# Patient Record
Sex: Female | Born: 1970 | Race: White | Hispanic: No | Marital: Married | Smoking: Former smoker
Health system: Southern US, Community
[De-identification: ages and names within clinical notes are randomized; demographics above are authoritative.]

## PROBLEM LIST (undated history)

## (undated) DIAGNOSIS — A483 Toxic shock syndrome: Secondary | ICD-10-CM

## (undated) DIAGNOSIS — E349 Endocrine disorder, unspecified: Secondary | ICD-10-CM

## (undated) DIAGNOSIS — B950 Streptococcus, group A, as the cause of diseases classified elsewhere: Secondary | ICD-10-CM

## (undated) DIAGNOSIS — I3139 Other pericardial effusion (noninflammatory): Secondary | ICD-10-CM

## (undated) DIAGNOSIS — R7989 Other specified abnormal findings of blood chemistry: Secondary | ICD-10-CM

## (undated) DIAGNOSIS — K219 Gastro-esophageal reflux disease without esophagitis: Secondary | ICD-10-CM

## (undated) DIAGNOSIS — I429 Cardiomyopathy, unspecified: Secondary | ICD-10-CM

## (undated) DIAGNOSIS — I313 Pericardial effusion (noninflammatory): Secondary | ICD-10-CM

## (undated) HISTORY — DX: Streptococcus, group A, as the cause of diseases classified elsewhere: B95.0

## (undated) HISTORY — PX: KNEE SURGERY: SHX244

## (undated) HISTORY — DX: Other pericardial effusion (noninflammatory): I31.39

## (undated) HISTORY — DX: Gastro-esophageal reflux disease without esophagitis: K21.9

## (undated) HISTORY — DX: Other specified abnormal findings of blood chemistry: R79.89

## (undated) HISTORY — DX: Cardiomyopathy, unspecified: I42.9

## (undated) HISTORY — DX: Toxic shock syndrome: A48.3

## (undated) HISTORY — PX: TOE FUSION: SHX1070

## (undated) HISTORY — DX: Pericardial effusion (noninflammatory): I31.3

## (undated) HISTORY — DX: Endocrine disorder, unspecified: E34.9

## (undated) HISTORY — PX: CARPAL TUNNEL RELEASE: SHX101

---

## 2006-05-15 ENCOUNTER — Emergency Department (HOSPITAL_COMMUNITY): Admission: EM | Admit: 2006-05-15 | Discharge: 2006-05-15 | Payer: Self-pay | Admitting: Family Medicine

## 2006-06-28 ENCOUNTER — Emergency Department (HOSPITAL_COMMUNITY): Admission: EM | Admit: 2006-06-28 | Discharge: 2006-06-28 | Payer: Self-pay | Admitting: Family Medicine

## 2006-07-04 ENCOUNTER — Emergency Department (HOSPITAL_COMMUNITY): Admission: EM | Admit: 2006-07-04 | Discharge: 2006-07-04 | Payer: Self-pay | Admitting: Emergency Medicine

## 2006-08-24 ENCOUNTER — Ambulatory Visit: Payer: Self-pay | Admitting: Family Medicine

## 2006-08-31 ENCOUNTER — Encounter: Admission: RE | Admit: 2006-08-31 | Discharge: 2006-08-31 | Payer: Self-pay | Admitting: Family Medicine

## 2006-09-27 ENCOUNTER — Ambulatory Visit: Payer: Self-pay | Admitting: Family Medicine

## 2006-10-05 ENCOUNTER — Ambulatory Visit: Payer: Self-pay | Admitting: Family Medicine

## 2006-11-05 ENCOUNTER — Ambulatory Visit: Payer: Self-pay | Admitting: Family Medicine

## 2007-01-14 ENCOUNTER — Inpatient Hospital Stay (HOSPITAL_COMMUNITY): Admission: AD | Admit: 2007-01-14 | Discharge: 2007-01-14 | Payer: Self-pay | Admitting: Obstetrics and Gynecology

## 2007-04-09 ENCOUNTER — Inpatient Hospital Stay (HOSPITAL_COMMUNITY): Admission: AD | Admit: 2007-04-09 | Discharge: 2007-04-12 | Payer: Self-pay | Admitting: Obstetrics and Gynecology

## 2007-05-02 ENCOUNTER — Emergency Department: Payer: Self-pay | Admitting: Emergency Medicine

## 2007-05-03 ENCOUNTER — Other Ambulatory Visit: Payer: Self-pay

## 2007-05-07 ENCOUNTER — Inpatient Hospital Stay: Payer: Self-pay | Admitting: Obstetrics and Gynecology

## 2007-05-29 ENCOUNTER — Ambulatory Visit: Payer: Self-pay | Admitting: Obstetrics and Gynecology

## 2007-06-01 ENCOUNTER — Observation Stay: Payer: Self-pay | Admitting: Certified Nurse Midwife

## 2007-06-12 ENCOUNTER — Inpatient Hospital Stay: Payer: Self-pay | Admitting: Obstetrics and Gynecology

## 2007-07-02 ENCOUNTER — Emergency Department: Payer: Self-pay | Admitting: Unknown Physician Specialty

## 2007-09-27 ENCOUNTER — Ambulatory Visit: Payer: Self-pay | Admitting: Family Medicine

## 2007-11-01 ENCOUNTER — Encounter
Admission: RE | Admit: 2007-11-01 | Discharge: 2007-11-01 | Payer: Self-pay | Admitting: Physical Medicine & Rehabilitation

## 2007-12-10 ENCOUNTER — Ambulatory Visit: Payer: Self-pay | Admitting: Professional

## 2007-12-17 ENCOUNTER — Ambulatory Visit: Payer: Self-pay | Admitting: Professional

## 2008-01-01 ENCOUNTER — Ambulatory Visit: Payer: Self-pay | Admitting: Professional

## 2008-01-10 ENCOUNTER — Emergency Department: Payer: Self-pay | Admitting: Unknown Physician Specialty

## 2008-01-10 ENCOUNTER — Other Ambulatory Visit: Payer: Self-pay

## 2008-02-20 ENCOUNTER — Ambulatory Visit: Payer: Self-pay | Admitting: Family Medicine

## 2008-02-20 DIAGNOSIS — K219 Gastro-esophageal reflux disease without esophagitis: Secondary | ICD-10-CM | POA: Insufficient documentation

## 2008-02-20 DIAGNOSIS — G43109 Migraine with aura, not intractable, without status migrainosus: Secondary | ICD-10-CM | POA: Insufficient documentation

## 2008-02-25 ENCOUNTER — Ambulatory Visit: Payer: Self-pay | Admitting: Family Medicine

## 2008-02-26 ENCOUNTER — Encounter: Payer: Self-pay | Admitting: Family Medicine

## 2008-02-27 LAB — CONVERTED CEMR LAB
AST: 17 units/L (ref 0–37)
Albumin: 4.4 g/dL (ref 3.5–5.2)
Alkaline Phosphatase: 51 units/L (ref 39–117)
Basophils Absolute: 0 10*3/uL (ref 0.0–0.1)
Basophils Relative: 0.1 % (ref 0.0–1.0)
Bilirubin, Direct: 0.1 mg/dL (ref 0.0–0.3)
CO2: 27 meq/L (ref 19–32)
Calcium: 9.6 mg/dL (ref 8.4–10.5)
Eosinophils Absolute: 0.2 10*3/uL (ref 0.0–0.7)
Eosinophils Relative: 2.4 % (ref 0.0–5.0)
GFR calc Af Amer: 81 mL/min
GFR calc non Af Amer: 67 mL/min
Glucose, Bld: 100 mg/dL — ABNORMAL HIGH (ref 70–99)
HDL: 58.8 mg/dL (ref >39.0)
Lymphocytes Relative: 41.2 % (ref 12.0–46.0)
MCV: 89 fL (ref 78.0–100.0)
Monocytes Absolute: 0.6 10*3/uL (ref 0.1–1.0)
Monocytes Relative: 6.8 % (ref 3.0–12.0)
Neutro Abs: 4 10*3/uL (ref 1.4–7.7)
Platelets: 256 10*3/uL (ref 150–400)
RBC: 4.98 M/uL (ref 3.87–5.11)
Total CHOL/HDL Ratio: 4.1
Total Protein: 6.9 g/dL (ref 6.0–8.3)
Triglycerides: 68 mg/dL (ref 0–149)
WBC: 8.2 10*3/uL (ref 4.5–10.5)

## 2008-03-05 ENCOUNTER — Ambulatory Visit: Payer: Self-pay | Admitting: Family Medicine

## 2008-03-05 ENCOUNTER — Telehealth: Payer: Self-pay | Admitting: Physician Assistant

## 2008-03-09 ENCOUNTER — Ambulatory Visit: Payer: Self-pay | Admitting: Gastroenterology

## 2008-04-24 ENCOUNTER — Ambulatory Visit: Payer: Self-pay | Admitting: Internal Medicine

## 2008-04-27 ENCOUNTER — Telehealth (INDEPENDENT_AMBULATORY_CARE_PROVIDER_SITE_OTHER): Payer: Self-pay

## 2008-04-29 ENCOUNTER — Encounter (INDEPENDENT_AMBULATORY_CARE_PROVIDER_SITE_OTHER): Payer: Self-pay | Admitting: *Deleted

## 2008-08-31 ENCOUNTER — Ambulatory Visit: Payer: Self-pay | Admitting: Family Medicine

## 2008-09-01 LAB — CONVERTED CEMR LAB
Basophils Relative: 0.4 % (ref 0.0–3.0)
CO2: 31 meq/L (ref 19–32)
Calcium: 9.1 mg/dL (ref 8.4–10.5)
Chloride: 105 meq/L (ref 96–112)
Creatinine, Ser: 0.8 mg/dL (ref 0.4–1.2)
Eosinophils Relative: 0.9 % (ref 0.0–5.0)
GFR calc Af Amer: 104 mL/min
GFR calc non Af Amer: 86 mL/min
Lymphocytes Relative: 20.3 % (ref 12.0–46.0)
Neutrophils Relative %: 73.9 % (ref 43.0–77.0)
Platelets: 253 10*3/uL (ref 150–400)
RBC: 4.61 M/uL (ref 3.87–5.11)
RDW: 12 % (ref 11.5–14.6)
Sodium: 141 meq/L (ref 135–145)
TSH: 1.03 microintl units/mL (ref 0.35–5.50)
Vitamin B-12: 757 pg/mL (ref 211–911)
WBC: 8.3 10*3/uL (ref 4.5–10.5)

## 2008-10-09 ENCOUNTER — Encounter: Payer: Self-pay | Admitting: Family Medicine

## 2008-10-22 ENCOUNTER — Encounter: Payer: Self-pay | Admitting: Family Medicine

## 2008-11-09 ENCOUNTER — Telehealth: Payer: Self-pay | Admitting: Family Medicine

## 2008-11-10 ENCOUNTER — Ambulatory Visit: Payer: Self-pay | Admitting: Family Medicine

## 2008-11-12 ENCOUNTER — Encounter: Payer: Self-pay | Admitting: Family Medicine

## 2009-02-11 ENCOUNTER — Encounter: Payer: Self-pay | Admitting: Family Medicine

## 2009-10-03 ENCOUNTER — Encounter: Admission: RE | Admit: 2009-10-03 | Discharge: 2009-10-03 | Payer: Self-pay | Admitting: Orthopedic Surgery

## 2009-10-07 ENCOUNTER — Telehealth: Payer: Self-pay | Admitting: Family Medicine

## 2009-10-07 DIAGNOSIS — I059 Rheumatic mitral valve disease, unspecified: Secondary | ICD-10-CM | POA: Insufficient documentation

## 2009-11-18 ENCOUNTER — Ambulatory Visit (HOSPITAL_COMMUNITY)
Admission: RE | Admit: 2009-11-18 | Discharge: 2009-11-19 | Payer: Self-pay | Source: Home / Self Care | Admitting: Orthopedic Surgery

## 2009-11-18 HISTORY — PX: ANTERIOR CERVICAL DECOMP/DISCECTOMY FUSION: SHX1161

## 2010-05-14 LAB — HM PAP SMEAR

## 2010-05-14 LAB — CONVERTED CEMR LAB: Pap Smear: NORMAL

## 2010-08-12 ENCOUNTER — Telehealth: Payer: Self-pay | Admitting: Family Medicine

## 2010-08-31 ENCOUNTER — Telehealth (INDEPENDENT_AMBULATORY_CARE_PROVIDER_SITE_OTHER): Payer: Self-pay | Admitting: *Deleted

## 2010-09-01 ENCOUNTER — Ambulatory Visit
Admission: RE | Admit: 2010-09-01 | Discharge: 2010-09-01 | Payer: Self-pay | Source: Home / Self Care | Attending: Family Medicine | Admitting: Family Medicine

## 2010-09-01 ENCOUNTER — Other Ambulatory Visit: Payer: Self-pay | Admitting: Family Medicine

## 2010-09-01 DIAGNOSIS — R5381 Other malaise: Secondary | ICD-10-CM | POA: Insufficient documentation

## 2010-09-01 DIAGNOSIS — R5383 Other fatigue: Secondary | ICD-10-CM

## 2010-09-01 LAB — BASIC METABOLIC PANEL
BUN: 25 mg/dL — ABNORMAL HIGH (ref 6–23)
CO2: 29 mEq/L (ref 19–32)
Calcium: 8.9 mg/dL (ref 8.4–10.5)
Chloride: 100 mEq/L (ref 96–112)
Creatinine, Ser: 0.8 mg/dL (ref 0.4–1.2)
GFR: 85.05 mL/min (ref >60.00)
Glucose, Bld: 85 mg/dL (ref 70–99)
Potassium: 4.3 mEq/L (ref 3.5–5.1)
Sodium: 136 mEq/L (ref 135–145)

## 2010-09-01 LAB — TSH: TSH: 4.06 u[IU]/mL (ref 0.35–5.50)

## 2010-09-01 LAB — CBC WITH DIFFERENTIAL/PLATELET
Basophils Absolute: 0 10*3/uL (ref 0.0–0.1)
Basophils Relative: 0.5 % (ref 0.0–3.0)
Eosinophils Absolute: 0.1 10*3/uL (ref 0.0–0.7)
Eosinophils Relative: 2.3 % (ref 0.0–5.0)
HCT: 41.6 % (ref 36.0–46.0)
Hemoglobin: 14.3 g/dL (ref 12.0–15.0)
Lymphocytes Relative: 45.8 % (ref 12.0–46.0)
Lymphs Abs: 2.9 10*3/uL (ref 0.7–4.0)
MCHC: 34.4 g/dL (ref 30.0–36.0)
MCV: 91.5 fl (ref 78.0–100.0)
Monocytes Absolute: 0.5 10*3/uL (ref 0.1–1.0)
Monocytes Relative: 7.4 % (ref 3.0–12.0)
Neutro Abs: 2.8 10*3/uL (ref 1.4–7.7)
Neutrophils Relative %: 44 % (ref 43.0–77.0)
Platelets: 250 10*3/uL (ref 150.0–400.0)
RBC: 4.55 Mil/uL (ref 3.87–5.11)
RDW: 12.8 % (ref 11.5–14.6)
WBC: 6.4 10*3/uL (ref 4.5–10.5)

## 2010-09-01 LAB — HEPATIC FUNCTION PANEL
ALT: 18 U/L (ref 0–35)
AST: 23 U/L (ref 0–37)
Albumin: 4.4 g/dL (ref 3.5–5.2)
Alkaline Phosphatase: 54 U/L (ref 39–117)
Bilirubin, Direct: 0.1 mg/dL (ref 0.0–0.3)
Total Bilirubin: 0.8 mg/dL (ref 0.3–1.2)
Total Protein: 6.8 g/dL (ref 6.0–8.3)

## 2010-09-01 LAB — LIPID PANEL
Cholesterol: 191 mg/dL (ref 0–200)
HDL: 64.6 mg/dL (ref >39.00)
LDL Cholesterol: 116 mg/dL — ABNORMAL HIGH (ref 0–99)
Total CHOL/HDL Ratio: 3
Triglycerides: 54 mg/dL (ref 0.0–149.0)
VLDL: 10.8 mg/dL (ref 0.0–40.0)

## 2010-09-04 ENCOUNTER — Encounter: Payer: Self-pay | Admitting: Orthopedic Surgery

## 2010-09-05 ENCOUNTER — Ambulatory Visit
Admission: RE | Admit: 2010-09-05 | Discharge: 2010-09-05 | Payer: Self-pay | Source: Home / Self Care | Attending: Family Medicine | Admitting: Family Medicine

## 2010-09-13 NOTE — Progress Notes (Signed)
Summary: Dental question  Phone Note From Other Clinic   Caller: Dr. Debria Garret [phone] Call For: Dr. Ermalene Searing Summary of Call: would like to know if patient needs pre-medication before dental treatment. Initial call taken by: Mervin Hack CMA Duncan Dull),  October 07, 2009 10:29 AM  Follow-up for Phone Call        I don't have any indication for pretreatment on problem list...please call pt and find out if she has any heart issues we may have not listed...? mitral valve prolapse etc.  Follow-up by: Kerby Nora MD,  October 07, 2009 10:48 AM  Additional Follow-up for Phone Call Additional follow up Details #1::        Left message for patient to call back.Marland Kitchen Spoke with dentist office and patient marked that she had mitral valve prolapse.Consuello Masse CMA  Additional Follow-up by: Benny Lennert CMA Duncan Dull),  October 07, 2009 2:47 PM  New Problems: MITRAL VALVE PROLAPSE (ICD-424.0)   Additional Follow-up for Phone Call Additional follow up Details #2::    Per 2007 AHA/ACC guidelines no longer recommend antibacteria prophylaxsis formitral valve prolapse.  Follow-up by: Kerby Nora MD,  October 08, 2009 8:52 AM  New Problems: MITRAL VALVE PROLAPSE (ICD-424.0)

## 2010-09-15 NOTE — Progress Notes (Signed)
----   Converted from flag ---- ---- 08/30/2010 1:15 PM, Hannah Beat MD wrote: FLP: v77.91 BMP: v77.1 Hepatic Function Panel: v58.69 CBC with diff: 780.79 TSH: 780.79   ---- 08/30/2010 11:04 AM, Liane Comber CMA (AAMA) wrote: Lab orders please! Good Morning! This pt is scheduled for cpx labs [place], which labs to draw and dx codes to use? Thanks Tasha ------------------------------

## 2010-09-15 NOTE — Progress Notes (Signed)
Summary: wants to switch dr's   Phone Note Call from Patient Call back at Home Phone [phone]   Caller: Patient Call For: Kerby Nora MD Summary of Call: Patient's husband is a patient of Dr. Patsy Lager and they want to have the same dr. Patient would like for Dr. Patsy Lager to become her pcp. Is it okay to schedule her an appt to get established with Dr. Patsy Lager. Please advise.  Initial call taken by: Melody Comas,  August 12, 2010 2:55 PM  Follow-up for Phone Call        Yes.. uncomplicated pt.. if okay with [facility]..switch.  Follow-up by: Kerby Nora MD,  August 12, 2010 3:04 PM  Additional Follow-up for Phone Call Additional follow up Details #1::        OK with me, family continuity is helpful. Hannah Beat MD  August 13, 2010 8:38 AM

## 2010-09-15 NOTE — Assessment & Plan Note (Signed)
Summary: CPX/CLE NO PAP   Vital Signs:  Patient profile:   40 year old female Weight:      141 pounds BMI:     22.84 Temp:     98.4 degrees F oral Pulse rate:   80 / minute Pulse rhythm:   regular BP sitting:   108 / 64  (left arm) Cuff size:   regular CC: 30 Minute checkup, no pap   History of Present Illness: 40 year old female:  Dr. Chrissie Noa - Dr. Logan Bores.  3 months - pap, normal  FH: No history of breast cancer.  No history of  colon cancer  Preventive Screening-Counseling & Management  Alcohol-Tobacco     Alcohol drinks/day: 0     Alcohol Counseling: not indicated; patient does not drink     Smoking Status: quit     Tobacco Counseling: not to resume use of tobacco products  Caffeine-Diet-Exercise     Diet Comments: great     Diet Counseling: not indicated; diet is assessed to be healthy     Does Patient Exercise: yes     Exercise Counseling: not indicated; exercise is adequate  Hep-HIV-STD-Contraception     HIV Risk: no risk noted     STD Risk: no risk noted     Contraception Counseling: questions answered     SBE Education/Counseling: to perform regular SBE      Sexual History:  currently monogamous.        Drug Use:  never.    Clinical Review Panels:  Prevention   Last Pap Smear:  normal (05/14/2010)  Immunizations   Last Tetanus Booster:  Historical (08/15/1999)  Lipid Management   Cholesterol:  191 (09/01/2010)   LDL (bad choesterol):  116 (09/01/2010)   HDL (good cholesterol):  64.60 (09/01/2010)  Diabetes Management   Creatinine:  0.8 (09/01/2010)  CBC   WBC:  6.4 (09/01/2010)   RBC:  4.55 (09/01/2010)   Hgb:  14.3 (09/01/2010)   Hct:  41.6 (09/01/2010)   Platelets:  250.0 (09/01/2010)   MCV  91.5 (09/01/2010)   MCHC  34.4 (09/01/2010)   RDW  12.8 (09/01/2010)   PMN:  44.0 (09/01/2010)   Lymphs:  45.8 (09/01/2010)   Monos:  7.4 (09/01/2010)   Eosinophils:  2.3 (09/01/2010)   Basophil:  0.5 (09/01/2010)  Complete Metabolic  Panel   Glucose:  85 (09/01/2010)   Sodium:  136 (09/01/2010)   Potassium:  4.3 (09/01/2010)   Chloride:  100 (09/01/2010)   CO2:  29 (09/01/2010)   BUN:  25 (09/01/2010)   Creatinine:  0.8 (09/01/2010)   Albumin:  4.4 (09/01/2010)   Total Protein:  6.8 (09/01/2010)   Calcium:  8.9 (09/01/2010)   Total Bili:  0.8 (09/01/2010)   Alk Phos:  54 (09/01/2010)   SGPT (ALT):  18 (09/01/2010)   SGOT (AST):  23 (09/01/2010)   Allergies: 1)  ! Codeine  Past History:  Past medical, surgical, family and social histories (including risk factors) reviewed, and no changes noted (except as noted below).  Past Medical History: Reviewed history from 02/20/2008 and no changes required. GERD  Past Surgical History: C-section 2008 complete placenta previa, hemmorhage 7  left knee surgeries 1996-1999  (2 lateral relaeses, 1 tibial tubercle ostomy, 1 patellectomy) diskectomy, 5,6,7: and fusion (Dr. Shon Baton)  Family History: Reviewed history from 02/20/2008 and no changes required. father: died age 81 from sepsis,  Alzheimer's,  HTN mother: HTN, high cholesterol siblings: healthy except sister migraines no cancer MGF: MI age  23 MGM: kidney failure  Social History: Reviewed history from 03/05/2008 and no changes required. Occupation: stay at home mom, previous Emergency planning/management officer Married 4 kids, healthy Past smoker x 16 pack year history Alcohol use-no Drug use-no Regular exercise-yes, 2 times a week Diet: fruits, veggies, water Smoking Status:  quit HIV Risk:  no risk noted STD Risk:  no risk noted Sexual History:  currently monogamous Drug Use:  never  Review of Systems  General: Denies fever, chills, sweats, anorexia, fatigue, weakness, malaise Eyes: Denies blurring, vision loss ENT: Denies earache, nasal congestion, nosebleeds, sore throat, and hoarseness.  Cardiovascular: Denies chest pains, palpitations, syncope, dyspnea on exertion,  Respiratory: Denies cough, dyspnea at  rest, excessive sputum,wheeezing GI: Denies nausea, vomiting, diarrhea, constipation, change in bowel habits, abdominal pain, melena, hematochezia GU: Denies dysuria, hematuria, discharge, urinary frequency, urinary hesitancy, nocturia, incontinence, genital sores, decreased libido Musculoskeletal: Denies back pain, OCC LEFT KNEE PAIN WITH SUBLUXING PATELLAR TENDON Derm: Denies rash, itching Neuro: Denies  paresthesias, frequent falls, frequent headaches, and difficulty walking.  Psych: Denies depression, anxiety Endocrine: Denies cold intolerance, heat intolerance, polydipsia, polyphagia, polyuria, and unusual weight change.  Heme: Denies enlarged lymph nodes Allergy: No hayfever   Otherwise, the pertinent positives and negatives are listed above and in the HPI, otherwise a full review of systems has been reviewed and is negative unless noted positive.   Physical Exam  General:  Well-developed,well-nourished,in no acute distress; alert,appropriate and cooperative throughout examination Head:  Normocephalic and atraumatic without obvious abnormalities. No apparent alopecia or balding. Eyes:  pupils equal, pupils round, pupils reactive to light, pupils react to accomodation, and corneas and lenses clear.   Ears:  External ear exam shows no significant lesions or deformities.  Otoscopic examination reveals clear canals, tympanic membranes are intact bilaterally without bulging, retraction, inflammation or discharge. Hearing is grossly normal bilaterally. Nose:  External nasal examination shows no deformity or inflammation. Nasal mucosa are pink and moist without lesions or exudates. Mouth:  Oral mucosa and oropharynx without lesions or exudates.  Teeth in good repair. Neck:  No deformities, masses, or tenderness noted. Chest Wall:  No deformities, masses, or tenderness noted. Lungs:  Normal respiratory effort, chest expands symmetrically. Lungs are clear to auscultation, no crackles or  wheezes. Heart:  Normal rate and regular rhythm. S1 and S2 normal without gallop, murmur, click, rub or other extra sounds. Abdomen:  Bowel sounds positive,abdomen soft and non-tender without masses, organomegaly or hernias noted. Msk:  normal ROM and no crepitation.   Extremities:  No clubbing, cyanosis, edema, or deformity noted with normal full range of motion of all joints.   Neurologic:  alert & oriented X3 and gait normal.   Skin:  Intact without suspicious lesions or rashes Cervical Nodes:  No lymphadenopathy noted Psych:  Cognition and judgment appear intact. Alert and cooperative with normal attention span and concentration. No apparent delusions, illusions, hallucinations   Impression & Recommendations:  Problem # 1:  HEALTH MAINTENANCE EXAM (ICD-V70.0) The patient's preventative maintenance and recommended screening tests for an annual wellness exam were reviewed in full today. Brought up to date unless services declined.  Counselled on the importance of diet, exercise, and its role in overall health and mortality. The patient's FH and SH was reviewed, including their home life, tobacco status, and drug and alcohol status.   Complete Medication List: 1)  Multivitamins Tabs (Multiple vitamin) .... Take 1 tablet by mouth once a day 2)  Advil 200 Mg Caps (Ibuprofen) .... As needed  3)  Tylenol 325 Mg Tabs (Acetaminophen) .... As needed 4)  Omeprazole 40 Mg Cpdr (Omeprazole) .Marland Kitchen.. 1 by mouth two times a day Prescriptions: OMEPRAZOLE 40 MG CPDR (OMEPRAZOLE) 1 by mouth two times a day  #60 x 5   Entered and Authorized by:   Hannah Beat MD   Signed by:   Hannah Beat MD on 09/05/2010   Method used:   Print then Give to Patient   RxID:   [id number]    Orders Added: 1)  Est. Patient 18-39 years [[id number]]    Current Allergies (reviewed today): ! CODEINE  Last PAP:  Normal (10/04/2006 2:27:09 PM) PAP Result Date:  05/14/2010 PAP Result:  normal

## 2010-11-02 LAB — CBC
Hemoglobin: 14.6 g/dL (ref 12.0–15.0)
MCHC: 34.1 g/dL (ref 30.0–36.0)
MCV: 90.8 fL (ref 78.0–100.0)
RBC: 4.71 MIL/uL (ref 3.87–5.11)

## 2010-11-02 LAB — BASIC METABOLIC PANEL
Calcium: 9.5 mg/dL (ref 8.4–10.5)
Chloride: 103 mEq/L (ref 96–112)
GFR calc Af Amer: >60 mL/min (ref >60)
GFR calc non Af Amer: >60 mL/min (ref >60)
Glucose, Bld: 86 mg/dL (ref 70–99)
Sodium: 136 mEq/L (ref 135–145)

## 2010-11-03 ENCOUNTER — Encounter: Payer: Self-pay | Admitting: Family Medicine

## 2010-11-07 ENCOUNTER — Encounter: Payer: Self-pay | Admitting: Family Medicine

## 2010-11-07 ENCOUNTER — Telehealth: Payer: Self-pay | Admitting: *Deleted

## 2010-11-07 ENCOUNTER — Ambulatory Visit (INDEPENDENT_AMBULATORY_CARE_PROVIDER_SITE_OTHER): Payer: BC Managed Care – PPO | Admitting: Family Medicine

## 2010-11-07 DIAGNOSIS — K5900 Constipation, unspecified: Secondary | ICD-10-CM | POA: Insufficient documentation

## 2010-11-07 DIAGNOSIS — M5412 Radiculopathy, cervical region: Secondary | ICD-10-CM | POA: Insufficient documentation

## 2010-11-07 DIAGNOSIS — B360 Pityriasis versicolor: Secondary | ICD-10-CM

## 2010-11-07 MED ORDER — FLUCONAZOLE 150 MG PO TABS: ORAL_TABLET | ORAL | Status: DC

## 2010-11-07 NOTE — Assessment & Plan Note (Signed)
diflucan 

## 2010-11-07 NOTE — Progress Notes (Signed)
40 year old pleasant female presents:  Neck pain: C5-6, C6-C7 fusion. Anterior fusion now having all numbness and pain in the numbness and arm, pain in the back of her neck . Using topical.  Dr. Shon Baton did fusion.  Significant pain, no weakness.  Rash:  Rash and multiple areas of anterior chest, posterior neck and in the rest of her upper extremities. Is not itching so much, but is perfectly flat. She also has some very small nonvesicular elevated areas  Keratosis:  Tinea Versicolor:     constipation, the patient does have intermittent constipation. She eats a lot of fiber, fruits, vegetables, bran, and drinks plenty of water, but does still have intermittent constipation and goes to the bathroom approximately one time a week.  REVIEW OF SYSTEMS  GEN: No fevers, chills. Nontoxic. Primarily MSK c/o today. MSK: Detailed in the HPI GI: tolerating PO intake without difficulty Neuro: as above Otherwise the pertinent positives of the ROS are noted above.    The PMH, PSH, Social History, Family History, Medications, and allergies have been reviewed in [facility], and have been updated if relevant.   GEN: Well-developed,well-nourished,in no acute distress; alert,appropriate and cooperative throughout examination HEENT: Normocephalic and atraumatic without obvious abnormalities. Ears, externally no deformities PULM: Breathing comfortably in no respiratory distress EXT: No clubbing, cyanosis, or edema PSYCH: Normally interactive. Cooperative during the interview. Pleasant. Friendly and conversant. Not anxious or depressed appearing. Normal, full affect. SKIN:  Macular rash anterior chest, arms, posterior shoulder blade area. Additionally, the patient does have an area on her upper chest with very small nonvesicular slightly raised areas.  CERVICAL SPINE EXAM Range of motion: Flexion, extension, lateral bending, and rotation: mild decrease Pain with terminal motion: mild Spinous Processes: mild TTP,  c4-7 area SCM: NT Upper paracervical muscles: NT Upper traps: NT C5-T1 intact, sensation and motor

## 2010-11-07 NOTE — Telephone Encounter (Signed)
Patient called questioning rx for Diflucan. It was for quantity of 1 with directions to take 3 now , and repeat in 1 week.   Per Dr. Patsy Lager it was to be for quantity of 6. Corrected with pharmacy.

## 2010-11-07 NOTE — Patient Instructions (Signed)
CONSTIPATION Difficult, uncomfortable, infrequent BM  1. Warm prune juice, hot water, tea, coffee, apple juice 2. Prevention: drink 8 glasses water daily, FIBER (raw fruit, veggies, bran cereal, whole grains), regular exercise 3. Bulk formers like Metamucil (psyllium), Citrucel (methylcellulose) usually help 4. Stool softeners (Docusate) occaisionally OK 5. Occaisional over the counter Miralax usually safe 6. Overuse of stimulant  laxatives (Ex-Lax, Mag Citrate, Dulcolax, etc.) can be habit forming  

## 2010-11-07 NOTE — Assessment & Plan Note (Signed)
Refer to the patient instructions sections for details of plan shared with patient.

## 2010-11-07 NOTE — Assessment & Plan Note (Signed)
Deteriorated, 1 year post-op, at this point, needs spine surgeon to reevaluate.

## 2010-12-27 NOTE — H&P (Signed)
Shelly [name], Shelly [name]                ACCOUNT NO.:  [id number]   MEDICAL RECORD NO.:  [id number]          PATIENT TYPE:  MAT   LOCATION:  [facility]                          FACILITY:  [facility]   PHYSICIAN:  Michelle L. Grewal, M.D.DATE OF BIRTH:  03-24-1971   DATE OF ADMISSION:  01/14/2007  DATE OF DISCHARGE:                              HISTORY & PHYSICAL   This is a 40 year old G1 P0 at approximately [redacted] weeks pregnant who  present to the emergency room after complaining of severe persistent  headache.  She has a history of chronic headaches but recently has noted  the headache is increased in intensity and infrequently. She is tried  Fioricet and Tylenol without relief.   EMERGENCY DEPARTMENT COURSE:  The patient is observed in emergency room,  she is noted to be normotensive, heart tones are heard.  She is given  Demerol 25 mg with Phenergan 12.5 mg IM x1. Her headache went from a 10  to 2. Neurologically the patient is intact.  Given that her headache  improved remarkably with the Demerol and Phenergan.  Plan is to  discharge her with Demerol 50 mg p.o. q.6 p.r.n. pain as well Phenergan  12.5 mg p.o. q.6-8 as needed for nausea and pain and she will follow up  in the office in 2 days. If her headache increases in intensity at that  point I recommend full neurologic consultation.  Of note, the patient  has and neurology appointment in early July, but if she is not improved  after 2 days we will seek full neurologic consultation      Michelle L. Vincente Poli, M.D.  Electronically Signed     MLG/MEDQ  D:  01/14/2007  T:  01/14/2007  Job:  [id number]

## 2010-12-30 NOTE — Discharge Summary (Signed)
Shelly [name], Shelly [name]                ACCOUNT NO.:  [id number]   MEDICAL RECORD NO.:  [id number]          PATIENT TYPE:  INP   LOCATION:  9159                          [facility]ACILITY:  [facility]   PHYSICIAN:  Juluis Mire, M.D.   DATE OF BIRTH:  1971-03-12   DATE OF ADMISSION:  04/09/2007  DATE OF DISCHARGE:  04/12/2007                               DISCHARGE SUMMARY   ADMITTING DIAGNOSES:  1. Intrauterine pregnancy at 57 weeks estimated gestational age.  2. Placenta previa, bleeding.   DISCHARGE DIAGNOSES:  1. Intrauterine pregnancy at 31 and three-sevenths weeks estimated      gestational age.  2. Placenta previa, bleeding resolved.   REASON FOR ADMISSION:  Please see written H&P.   HOSPITAL COURSE:  The patient is a 40 year old gravida 4, para 3 that  was admitted to [facility] at 28 weeks estimated  gestational age with vaginal bleeding.  The patient did have history of  a previa.  The patient denied any discomfort or uterine contractions.  On admission, vital signs were stable, fetal heart tones were in the  140s without deceleration.  The patient was now admitted for  observation, steroid administration, ultrasound.  On the following  morning the patient stated she was having minimal bleeding.  Vital signs  remained stable.  Hemoglobin was noted to be 10.8.  Fetal heart tones  were reactive.  The patient continued on bedrest.  She was being  administered betamethasone for enhancement of lung maturity.  Later that  evening, the patient denied any further bleeding.  Glucose tolerance  test was scheduled for the a.m.  On the following morning, vital signs  remained stable, fetal heart tones were in the 140s with nice  acceleration, no contractions were detected.  Minimal old blood was  noted on the pad.  Ultrasound had revealed fetus in the breech  presentation with complete previa.  On the following morning, vital  signs were stable, she remained afebrile.  Abdomen soft,  gravid uterus,  nontender.  No further vaginal bleeding was being observed.  Fetal  movement was experienced.  Fetal heart tones remained reactive.  The  patient had completed betamethasone x2.  AFI was 16 and fetal weight was  1377 g which is in the 72nd percentile.  Glucose tolerance test was 161;  however, thought was not adequate evaluation considering it was  performed immediately following betamethasone.  Recommended the patient  to have followup in 1 week.  On the following morning the patient denied  any further bleeding, no contractions.  She did desire discharge.  Baby  was active.  Discharge instructions were reviewed and the patient was  later discharged home.   CONDITION ON DISCHARGE:  Stable.   DIET:  Regular as tolerated.   FOLLOWUP:  The patient to follow up in the office in 1 week.  She is to  call for menstrual-like cramping, backache, vaginal bleeding, or loss of  fluid.   DISCHARGE MEDICATIONS:  Prenatal vitamins one p.o. daily.      Julio Sicks, N.P.      Juluis Mire,  M.D.  Electronically Signed    CC/MEDQ  D:  05/07/2007  T:  05/07/2007  Job:  [id number]

## 2011-04-03 ENCOUNTER — Other Ambulatory Visit: Payer: Self-pay | Admitting: Family Medicine

## 2011-04-04 ENCOUNTER — Other Ambulatory Visit: Payer: Self-pay | Admitting: *Deleted

## 2011-04-04 NOTE — Telephone Encounter (Signed)
Opened in error

## 2011-05-22 ENCOUNTER — Other Ambulatory Visit: Payer: Self-pay | Admitting: Family Medicine

## 2011-05-22 NOTE — Telephone Encounter (Signed)
Ok, #1

## 2011-05-26 LAB — CBC
Hemoglobin: 10.2 — ABNORMAL LOW
Platelets: 246
RBC: 3.22 — ABNORMAL LOW
RBC: 3.39 — ABNORMAL LOW
RDW: 13.2
WBC: 12.7 — ABNORMAL HIGH
WBC: 18.2 — ABNORMAL HIGH

## 2011-05-26 LAB — GLUCOSE TOLERANCE, 1 HOUR: Glucose, 1 Hour GTT: 161 — ABNORMAL HIGH

## 2012-02-05 ENCOUNTER — Other Ambulatory Visit: Payer: Self-pay | Admitting: Family Medicine

## 2012-02-08 ENCOUNTER — Encounter: Payer: Self-pay | Admitting: Family Medicine

## 2012-02-08 ENCOUNTER — Ambulatory Visit (INDEPENDENT_AMBULATORY_CARE_PROVIDER_SITE_OTHER): Payer: Self-pay | Admitting: Family Medicine

## 2012-02-08 VITALS — BP 120/72 | HR 81 | Temp 98.2°F | Ht 66.0 in

## 2012-02-08 DIAGNOSIS — B360 Pityriasis versicolor: Secondary | ICD-10-CM

## 2012-02-08 MED ORDER — FLUCONAZOLE 150 MG PO TABS: ORAL_TABLET | ORAL | Status: DC

## 2012-02-08 NOTE — Patient Instructions (Signed)
Vicks vapo rub -- put on as much as want for 7-10 days Do for a couple of days, then use 1% hydrocortisone cream for 5-7

## 2012-02-08 NOTE — Progress Notes (Signed)
   Nature conservation officer at [facility] [address] [place] [address] Phone: [phone] Fax: [phone]   Patient Name: Shelly [name] Date of Birth: Mar 07, 1971 Age: 41 y.o. Medical Record Number: [id number] Gender: female Date of Encounter: 02/08/2012  History of Present Illness:  Shelly [name] is a 41 y.o. very pleasant female patient who presents with the following:  Tinea Versicolor: The patient was at the beach last week, and she has noticed that she has had a return of a flat macular rash that she has had last year. This responds well to Diflucan.  Past Medical History, Surgical History, Social History, Family History, Problem List, Medications, and Allergies have been reviewed and updated if relevant.  Prior to Admission medications   Medication Sig Start Date End Date Taking? Authorizing Provider  ibuprofen (ADVIL,MOTRIN) 200 MG tablet Take 200 mg by mouth as needed.     Yes Historical Provider, MD  Multiple Vitamin (MULTIVITAMIN) tablet Take 1 tablet by mouth daily.     Yes Historical Provider, MD    Review of Systems:  GEN: No acute illnesses, no fevers, chills. GI: No n/v/d, eating normally Pulm: No SOB Interactive and getting along well at home.  Otherwise, ROS is as per the HPI.   Physical Examination: Filed Vitals:   02/08/12 1159  BP: 120/72  Pulse: 81  Temp: 98.2 F (36.8 C)   Filed Vitals:   02/08/12 1159  Height: 5\' 6"  (1.676 m)   There is no weight on file to calculate BMI. Ideal Body Weight: Weight in (lb) to have BMI = 25: 154.6    GEN: WDWN, NAD, Non-toxic, Alert & Oriented x 3 HEENT: Atraumatic, Normocephalic.  Ears and Nose: No external deformity. EXTR: No clubbing/cyanosis/edema NEURO: Normal gait.  PSYCH: Normally interactive. Conversant. Not depressed or anxious appearing.  Calm demeanor.  SKIN: Scattered macular rash with mild rare scale arms and shoulders  Assessment and Plan:  1. Tinea versicolor  fluconazole (DIFLUCAN)  150 MG tablet    Orders Today: No orders of the defined types were placed in this encounter.    Medications Today: Meds ordered this encounter  Medications  . fluconazole (DIFLUCAN) 150 MG tablet    Sig: 2 tabs once a week for 4 weeks    Dispense:  8 tablet    Refill:  0     Hannah Beat, MD

## 2012-02-12 ENCOUNTER — Ambulatory Visit: Payer: BC Managed Care – PPO | Admitting: Family Medicine

## 2012-09-19 ENCOUNTER — Other Ambulatory Visit: Payer: Self-pay | Admitting: Family Medicine

## 2013-04-23 ENCOUNTER — Other Ambulatory Visit: Payer: Self-pay | Admitting: Orthopedic Surgery

## 2013-04-24 ENCOUNTER — Encounter (HOSPITAL_BASED_OUTPATIENT_CLINIC_OR_DEPARTMENT_OTHER): Payer: Self-pay | Admitting: *Deleted

## 2013-04-24 NOTE — Progress Notes (Signed)
No labs needed

## 2013-04-30 NOTE — H&P (Signed)
  [facility] [address]   [address] [place], [place] [address] [phone] A Division of [facility]  Loreta Ave, M.D.   Robert A. Thurston Hole, M.D.   Burnell Blanks, M.D.   Eulas Post, M.D.   Lunette Stands, M.D Buford Dresser, M.D.  Charlsie Quest, M.D.   Estell Harpin, M.D.   Melina Fiddler, M.D. Genene Churn. Barry Dienes, PA-C            Kirstin A. 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  Comes in for evaluation of bilateral great toe pain.  Seen and referred by Dr. Cleophas Dunker.  Gradual onset.  Getting much worse over the last few months.  Obvious spurring, loss of motion and pain first MP joint, both feet.  This has progressed to a point that even walking throughout the day causes significant symptoms.  Initially it took more significant activity.  She has significant loss of motion, dorsal spurring.  Pain deep seated in the first MP joint.  Otherwise she is in great health.  Transient improvement with intraarticular injection of first MP joint with Depo-Medrol/Marcaine, not lasting more than a couple of weeks.  Comes in to discuss definitive treatment.  I have reviewed previous workup and treatment to date.  X-rays of both first MP joint reviewed.  On both sides she has moderate narrowing globally in the joint with significant dorsal spurring.  Other joints in the forefoot, midfoot and ankle all look good.  No other significant history of arthritis. Remaining history and general exam is outlined and included in the chart.  EXAMINATION: Specifically, this is a very healthy appearing 42 year-old female.  With walking anything that allows dorsiflexion of any significance first MP joint is painful.  No varus or valgus deformity.  Neurovascularly intact.   Obvious spurring dorsal aspect first MP joint.  Dorsiflexion about 30 degrees on both sides with a relatively abrupt end point.  On the right you can feel bone hit bone.  No sesamoiditis symptoms on either side.  No lesser toe deformities.  No midfoot, hindfoot or ankle issues.    DISPOSITION:  Bilateral hallux rigidus, right greater than left.  Failed conservative treatment including injection.  We have talked about definitive treatment with cheilectomy done sequentially one side and then the other.  I have explained that this is underlying arthritis that will advance in the future, but I think she meets parameters to do a cheilectomy more now rather than go immediately to a fusion.  Procedure, risks, benefits and complications reviewed in detail.  The timing of doing this one side and then the other also outlined.  We could do both at once, but I think she will do better with sequential treatment.  She understands and agrees.  Paperwork complete.  I will see her at the time of operative intervention.    Loreta Ave, M.D.   Electronically verified by Loreta Ave, M.D. DFM:jjh D 02-19-13 T 02-20-13

## 2013-05-01 ENCOUNTER — Encounter (HOSPITAL_BASED_OUTPATIENT_CLINIC_OR_DEPARTMENT_OTHER): Payer: Self-pay | Admitting: Anesthesiology

## 2013-05-01 ENCOUNTER — Ambulatory Visit (HOSPITAL_BASED_OUTPATIENT_CLINIC_OR_DEPARTMENT_OTHER): Payer: BC Managed Care – PPO | Admitting: Anesthesiology

## 2013-05-01 ENCOUNTER — Encounter (HOSPITAL_BASED_OUTPATIENT_CLINIC_OR_DEPARTMENT_OTHER): Payer: Self-pay

## 2013-05-01 ENCOUNTER — Encounter (HOSPITAL_BASED_OUTPATIENT_CLINIC_OR_DEPARTMENT_OTHER)
Admission: RE | Disposition: A | Discharge status: Home / Self Care | Payer: Self-pay | Source: Ambulatory Visit | Attending: Orthopedic Surgery

## 2013-05-01 ENCOUNTER — Ambulatory Visit (HOSPITAL_BASED_OUTPATIENT_CLINIC_OR_DEPARTMENT_OTHER)
Admission: RE | Admit: 2013-05-01 | Discharge: 2013-05-01 | Disposition: A | Discharge status: Home / Self Care | Payer: BC Managed Care – PPO | Source: Ambulatory Visit | Attending: Orthopedic Surgery | Admitting: Orthopedic Surgery

## 2013-05-01 DIAGNOSIS — M2020 Hallux rigidus, unspecified foot: Secondary | ICD-10-CM | POA: Insufficient documentation

## 2013-05-01 DIAGNOSIS — M25572 Pain in left ankle and joints of left foot: Secondary | ICD-10-CM

## 2013-05-01 HISTORY — PX: CHEILECTOMY: SHX1336

## 2013-05-01 SURGERY — CHEILECTOMY
Anesthesia: General | Site: Foot | Laterality: Left | Wound class: Clean

## 2013-05-01 MED ORDER — BUPIVACAINE HCL (PF) 0.5 % IJ SOLN
INTRAMUSCULAR | Status: DC | PRN
  Administered 2013-05-01: 10 mL

## 2013-05-01 MED ORDER — LIDOCAINE HCL (CARDIAC) 20 MG/ML IV SOLN
INTRAVENOUS | Status: DC | PRN
  Administered 2013-05-01: 60 mg via INTRAVENOUS

## 2013-05-01 MED ORDER — OXYCODONE-ACETAMINOPHEN 7.5-325 MG PO TABS: 1.0000 | ORAL_TABLET | ORAL | Status: DC | PRN

## 2013-05-01 MED ORDER — MIDAZOLAM HCL 2 MG/2ML IJ SOLN: 1.0000 mg | INTRAMUSCULAR | Status: DC | PRN

## 2013-05-01 MED ORDER — FENTANYL CITRATE 0.05 MG/ML IJ SOLN: 50.0000 ug | INTRAMUSCULAR | Status: DC | PRN

## 2013-05-01 MED ORDER — CEFAZOLIN SODIUM-DEXTROSE 2-3 GM-% IV SOLR
2.0000 g | INTRAVENOUS | Status: AC
  Administered 2013-05-01: 2 g via INTRAVENOUS

## 2013-05-01 MED ORDER — LACTATED RINGERS IV SOLN
INTRAVENOUS | Status: DC
  Administered 2013-05-01: 08:00:00 via INTRAVENOUS

## 2013-05-01 MED ORDER — PROPOFOL 10 MG/ML IV BOLUS
INTRAVENOUS | Status: DC | PRN
  Administered 2013-05-01: 200 mg via INTRAVENOUS

## 2013-05-01 MED ORDER — DEXTROSE-NACL 5-0.45 % IV SOLN: INTRAVENOUS | Status: DC

## 2013-05-01 MED ORDER — ONDANSETRON HCL 4 MG/2ML IJ SOLN
INTRAMUSCULAR | Status: DC | PRN
  Administered 2013-05-01: 4 mg via INTRAVENOUS

## 2013-05-01 MED ORDER — KETOROLAC TROMETHAMINE 30 MG/ML IJ SOLN
INTRAMUSCULAR | Status: DC | PRN
  Administered 2013-05-01: 30 mg via INTRAVENOUS

## 2013-05-01 MED ORDER — MIDAZOLAM HCL 5 MG/5ML IJ SOLN
INTRAMUSCULAR | Status: DC | PRN
  Administered 2013-05-01: 2 mg via INTRAVENOUS

## 2013-05-01 MED ORDER — FENTANYL CITRATE 0.05 MG/ML IJ SOLN
INTRAMUSCULAR | Status: DC | PRN
  Administered 2013-05-01 (×2): 50 ug via INTRAVENOUS

## 2013-05-01 MED ORDER — PROMETHAZINE HCL 25 MG/ML IJ SOLN: 6.2500 mg | INTRAMUSCULAR | Status: DC | PRN

## 2013-05-01 MED ORDER — HYDROMORPHONE HCL PF 1 MG/ML IJ SOLN
0.2500 mg | INTRAMUSCULAR | Status: DC | PRN
  Administered 2013-05-01: 0.5 mg via INTRAVENOUS

## 2013-05-01 MED ORDER — DEXAMETHASONE SODIUM PHOSPHATE 4 MG/ML IJ SOLN
INTRAMUSCULAR | Status: DC | PRN
  Administered 2013-05-01: 10 mg via INTRAVENOUS

## 2013-05-01 MED ORDER — CHLORHEXIDINE GLUCONATE 4 % EX LIQD: 60.0000 mL | Freq: Once | CUTANEOUS | Status: DC

## 2013-05-01 MED ORDER — FENTANYL CITRATE 0.05 MG/ML IJ SOLN: 50.0000 ug | Freq: Once | INTRAMUSCULAR | Status: DC

## 2013-05-01 SURGICAL SUPPLY — 67 items
APL SKNCLS STERI-STRIP NONHPOA (GAUZE/BANDAGES/DRESSINGS) ×1
BANDAGE ELASTIC 4 VELCRO ST LF (GAUZE/BANDAGES/DRESSINGS) ×1 IMPLANT
BANDAGE ELASTIC 6 VELCRO ST LF (GAUZE/BANDAGES/DRESSINGS) IMPLANT
BANDAGE ESMARK 6X9 LF (GAUZE/BANDAGES/DRESSINGS) ×1 IMPLANT
BENZOIN TINCTURE PRP APPL 2/3 (GAUZE/BANDAGES/DRESSINGS) ×1 IMPLANT
BLADE AVERAGE 25X9 (BLADE) IMPLANT
BLADE OSC/SAG .038X5.5 CUT EDG (BLADE) ×1 IMPLANT
BLADE SURG 15 STRL LF DISP TIS (BLADE) ×1 IMPLANT
BLADE SURG 15 STRL SS (BLADE) ×2
BNDG CMPR 9X6 STRL LF SNTH (GAUZE/BANDAGES/DRESSINGS) ×1
BNDG COHESIVE 4X5 TAN STRL (GAUZE/BANDAGES/DRESSINGS) ×2 IMPLANT
BNDG ESMARK 6X9 LF (GAUZE/BANDAGES/DRESSINGS) ×2
CANISTER SUCTION 1200CC (MISCELLANEOUS) IMPLANT
CLOTH BEACON ORANGE TIMEOUT ST (SAFETY) ×2 IMPLANT
COVER TABLE BACK 60X90 (DRAPES) ×2 IMPLANT
CUFF TOURNIQUET SINGLE 18IN (TOURNIQUET CUFF) IMPLANT
CUFF TOURNIQUET SINGLE 24IN (TOURNIQUET CUFF) ×1 IMPLANT
CUFF TOURNIQUET SINGLE 34IN LL (TOURNIQUET CUFF) IMPLANT
DECANTER SPIKE VIAL GLASS SM (MISCELLANEOUS) IMPLANT
DRAPE EXTREMITY T 121X128X90 (DRAPE) ×2 IMPLANT
DRAPE OEC MINIVIEW 54X84 (DRAPES) ×1 IMPLANT
DRAPE U 20/CS (DRAPES) ×2 IMPLANT
DRAPE U-SHAPE 47X51 STRL (DRAPES) ×2 IMPLANT
DURAPREP 26ML APPLICATOR (WOUND CARE) ×2 IMPLANT
ELECT NDL TIP 2.8 STRL (NEEDLE) ×1 IMPLANT
ELECT NEEDLE TIP 2.8 STRL (NEEDLE) IMPLANT
ELECT REM PT RETURN 9FT ADLT (ELECTROSURGICAL) ×2
ELECTRODE REM PT RTRN 9FT ADLT (ELECTROSURGICAL) ×1 IMPLANT
GAUZE XEROFORM 1X8 LF (GAUZE/BANDAGES/DRESSINGS) ×2 IMPLANT
GLOVE BIO SURGEON STRL SZ8 (GLOVE) ×2 IMPLANT
GLOVE BIOGEL PI IND STRL 7.0 (GLOVE) IMPLANT
GLOVE BIOGEL PI IND STRL 8.5 (GLOVE) ×1 IMPLANT
GLOVE BIOGEL PI INDICATOR 7.0 (GLOVE) ×1
GLOVE BIOGEL PI INDICATOR 8.5 (GLOVE) ×1
GLOVE ECLIPSE 6.5 STRL STRAW (GLOVE) ×1 IMPLANT
GLOVE EXAM NITRILE LRG STRL (GLOVE) ×2 IMPLANT
GLOVE ORTHO TXT STRL SZ7.5 (GLOVE) ×3 IMPLANT
GOWN BRE IMP PREV XXLGXLNG (GOWN DISPOSABLE) ×1 IMPLANT
GOWN PREVENTION PLUS XLARGE (GOWN DISPOSABLE) ×2 IMPLANT
GOWN PREVENTION PLUS XXLARGE (GOWN DISPOSABLE) ×2 IMPLANT
NDL HYPO 25X1 1.5 SAFETY (NEEDLE) IMPLANT
NEEDLE HYPO 25X1 1.5 SAFETY (NEEDLE) ×2 IMPLANT
NS IRRIG 1000ML POUR BTL (IV SOLUTION) ×2 IMPLANT
PACK BASIN DAY SURGERY FS (CUSTOM PROCEDURE TRAY) ×2 IMPLANT
PAD CAST 3X4 CTTN HI CHSV (CAST SUPPLIES) IMPLANT
PAD CAST 4YDX4 CTTN HI CHSV (CAST SUPPLIES) ×2 IMPLANT
PADDING CAST COTTON 3X4 STRL (CAST SUPPLIES)
PADDING CAST COTTON 4X4 STRL (CAST SUPPLIES) ×2
PENCIL BUTTON HOLSTER BLD 10FT (ELECTRODE) ×2 IMPLANT
SPONGE GAUZE 4X4 12PLY (GAUZE/BANDAGES/DRESSINGS) ×2 IMPLANT
SPONGE LAP 4X18 X RAY DECT (DISPOSABLE) IMPLANT
STOCKINETTE 4X48 STRL (DRAPES) ×2 IMPLANT
STRIP CLOSURE SKIN 1/2X4 (GAUZE/BANDAGES/DRESSINGS) ×1 IMPLANT
SUCTION FRAZIER TIP 10 FR DISP (SUCTIONS) IMPLANT
SUT ETHIBOND 2 OS 4 DA (SUTURE) IMPLANT
SUT ETHILON 3 0 PS 1 (SUTURE) ×2 IMPLANT
SUT VIC AB 0 SH 27 (SUTURE) ×2 IMPLANT
SUT VIC AB 2-0 SH 27 (SUTURE)
SUT VIC AB 2-0 SH 27XBRD (SUTURE) IMPLANT
SUT VIC AB 3-0 SH 27 (SUTURE) ×2
SUT VIC AB 3-0 SH 27X BRD (SUTURE) ×1 IMPLANT
SUT VICRYL 4-0 PS2 18IN ABS (SUTURE) IMPLANT
SYR BULB 3OZ (MISCELLANEOUS) ×2 IMPLANT
SYR CONTROL 10ML LL (SYRINGE) ×2 IMPLANT
TUBE CONNECTING 20X1/4 (TUBING) IMPLANT
UNDERPAD 30X30 INCONTINENT (UNDERPADS AND DIAPERS) ×2 IMPLANT
YANKAUER SUCT BULB TIP NO VENT (SUCTIONS) IMPLANT

## 2013-05-01 NOTE — Anesthesia Procedure Notes (Signed)
Procedure Name: LMA Insertion Performed by: Bodee Lafoe W Pre-anesthesia Checklist: Patient identified, Timeout performed, Emergency Drugs available, Suction available and Patient being monitored Patient Re-evaluated:Patient Re-evaluated prior to inductionOxygen Delivery Method: Circle system utilized Preoxygenation: Pre-oxygenation with 100% oxygen Intubation Type: IV induction Ventilation: Mask ventilation without difficulty LMA: LMA inserted LMA Size: 4.0 Number of attempts: 1 Placement Confirmation: breath sounds checked- equal and bilateral and positive ETCO2 Tube secured with: Tape Dental Injury: Teeth and Oropharynx as per pre-operative assessment      

## 2013-05-01 NOTE — Transfer of Care (Signed)
Immediate Anesthesia Transfer of Care Note  Patient: Shelly [name]  Procedure(s) Performed: Procedure(s): LEFT GREAT TOE HALLUX RIGIDUS WITH CHEILECTOMY 1ST METATARSOPHALANGEAL (Left)  Patient Location: PACU  Anesthesia Type:General  Level of Consciousness: awake, alert  and sedated  Airway & Oxygen Therapy: Patient Spontanous Breathing and Patient connected to face mask oxygen  Post-op Assessment: Report given to PACU RN and Post -op Vital signs reviewed and stable  Post vital signs: Reviewed and stable  Complications: No apparent anesthesia complications

## 2013-05-01 NOTE — Anesthesia Postprocedure Evaluation (Signed)
  Anesthesia Post-op Note  Patient: Shelly [name]  Procedure(s) Performed: Procedure(s): LEFT GREAT TOE HALLUX RIGIDUS WITH CHEILECTOMY 1ST METATARSOPHALANGEAL (Left)  Patient Location: PACU  Anesthesia Type:General  Level of Consciousness: awake and alert   Airway and Oxygen Therapy: Patient Spontanous Breathing  Post-op Pain: mild  Post-op Assessment: Post-op Vital signs reviewed, Patient's Cardiovascular Status Stable, Respiratory Function Stable, Patent Airway, No signs of Nausea or vomiting and Pain level controlled  Post-op Vital Signs: stable  Complications: No apparent anesthesia complications

## 2013-05-01 NOTE — Anesthesia Preprocedure Evaluation (Addendum)
Anesthesia Evaluation  Patient identified by MRN, date of birth, ID band Patient awake    Reviewed: Allergy & Precautions, H&P , NPO status , Patient's Chart, lab work & pertinent test results  Airway Mallampati: II TM Distance: >3 FB Neck ROM: Limited    Dental   Pulmonary  breath sounds clear to auscultation        Cardiovascular + Valvular Problems/Murmurs MVP Rhythm:Regular Rate:Normal     Neuro/Psych  Headaches,  Neuromuscular disease    GI/Hepatic GERD-  ,  Endo/Other    Renal/GU      Musculoskeletal   Abdominal   Peds  Hematology   Anesthesia Other Findings   Reproductive/Obstetrics                           Anesthesia Physical Anesthesia Plan  ASA: II  Anesthesia Plan: General   Post-op Pain Management:    Induction: Intravenous  Airway Management Planned: LMA  Additional Equipment:   Intra-op Plan:   Post-operative Plan: Extubation in OR  Informed Consent: I have reviewed the patients History and Physical, chart, labs and discussed the procedure including the risks, benefits and alternatives for the proposed anesthesia with the patient or authorized representative who has indicated his/her understanding and acceptance.     Plan Discussed with: CRNA and Surgeon  Anesthesia Plan Comments:         Anesthesia Quick Evaluation

## 2013-05-01 NOTE — Interval H&P Note (Signed)
History and Physical Interval Note:  05/01/2013 7:34 AM  Shelly [name]  has presented today for surgery, with the diagnosis of LEFT GREAT TOE HALLUX RIGIDUS  The various methods of treatment have been discussed with the patient and family. After consideration of risks, benefits and other options for treatment, the patient has consented to  Procedure(s): LEFT GREAT TOE HALLUX RIGIDUS WITH CHEILECTOMY 1ST METATARSOPHALANGEAL (Left) as a surgical intervention .  The patient's history has been reviewed, patient examined, no change in status, stable for surgery.  I have reviewed the patient's chart and labs.  Questions were answered to the patient's satisfaction.     MURPHY,DANIEL F

## 2013-05-02 ENCOUNTER — Encounter (HOSPITAL_BASED_OUTPATIENT_CLINIC_OR_DEPARTMENT_OTHER): Payer: Self-pay | Admitting: Orthopedic Surgery

## 2013-05-02 NOTE — Op Note (Signed)
NAMEDANDREA, Shelly [name] NO.:  [id number]  MEDICAL RECORD NO.:  [id number]  LOCATION:                               FACILITY:  [facility]  PHYSICIAN:  Loreta Ave, M.D. DATE OF BIRTH:  [birth year]  DATE OF PROCEDURE:  05/01/2013 DATE OF DISCHARGE:  05/01/2013                              OPERATIVE REPORT   PREOPERATIVE DIAGNOSIS:  Hallux rigidus, great toe, left foot.  POSTOPERATIVE DIAGNOSIS:  Hallux rigidus, great toe, left foot.  PROCEDURE:  Left foot first MP exploration, cheilectomy with removal of spurs on the metatarsal head and proximal phalanx establishing good motion.  Generalized debridement of the joint.  SURGEON:  Loreta Ave, M.D.  ASSISTANT:  Danford Bad. Willa Rough, PA-C  ANESTHESIA:  General.  BLOOD LOSS:  Minimal.  SPECIMENS:  None.  CULTURES:  None.  COMPLICATION:  None.  DRESSINGS:  Soft compressive wooden shoe.  TOURNIQUET TIME:  35 minutes.  PROCEDURE IN DETAIL:  The patient was brought to the operating room, placed on the operating table in the supine position.  After adequate anesthesia had been obtained, tourniquet applied.  Prepped and draped in usual sterile fashion.  Exsanguinated with elevation of Esmarch. Tourniquet inflated to 250 mmHg.  Longitudinal incision, dorsal, medial over the first MP joint.  Skin and subcutaneous tissues divided. Adjacent to the extensor tendon, capsulotomy exposed the joint. Fluoroscopic guidance used throughout.  Large dorsal spur top of the metatarsal head and the smaller one at the base of proximal phalanx were all excised contours smoothly.  Grade 4 changes over the top half of the joint grade 3, a little bit more plantar.  Worse on the metatarsal head. Wound completely irrigated.  Confirmed adequate excision and cheilectomy.  Full dorsiflexion and completion without tethering of the joint.  Wound was irrigated again.  The capsule was closed with #1 Vicryl.  Skin closed with nylon.   Margins were injected with Marcaine.  Sterile compressive dressing applied.  Tourniquet inflated and removed.  Anesthesia reversed.  Brought to the recovery room.  Tolerated the surgery well without complications.     Loreta Ave, M.D.   ______________________________ Loreta Ave, M.D.    DFM/MEDQ  D:  05/01/2013  T:  05/02/2013  Job:  [id number]

## 2013-05-05 ENCOUNTER — Encounter (HOSPITAL_BASED_OUTPATIENT_CLINIC_OR_DEPARTMENT_OTHER): Payer: Self-pay | Admitting: Orthopedic Surgery

## 2013-06-02 HISTORY — PX: CHEILECTOMY: SHX1336

## 2013-07-01 ENCOUNTER — Emergency Department (HOSPITAL_COMMUNITY): Payer: BC Managed Care – PPO

## 2013-07-01 ENCOUNTER — Inpatient Hospital Stay (HOSPITAL_COMMUNITY)
Admission: EM | Admit: 2013-07-01 | Discharge: 2013-07-08 | DRG: 853 | Disposition: A | Discharge status: Home / Self Care | Payer: BC Managed Care – PPO | Attending: Internal Medicine | Admitting: Internal Medicine

## 2013-07-01 ENCOUNTER — Encounter (HOSPITAL_COMMUNITY): Payer: Self-pay | Admitting: Emergency Medicine

## 2013-07-01 ENCOUNTER — Inpatient Hospital Stay (HOSPITAL_COMMUNITY): Payer: BC Managed Care – PPO

## 2013-07-01 DIAGNOSIS — D649 Anemia, unspecified: Secondary | ICD-10-CM

## 2013-07-01 DIAGNOSIS — K219 Gastro-esophageal reflux disease without esophagitis: Secondary | ICD-10-CM

## 2013-07-01 DIAGNOSIS — R5381 Other malaise: Secondary | ICD-10-CM

## 2013-07-01 DIAGNOSIS — G43109 Migraine with aura, not intractable, without status migrainosus: Secondary | ICD-10-CM

## 2013-07-01 DIAGNOSIS — I959 Hypotension, unspecified: Secondary | ICD-10-CM | POA: Diagnosis present

## 2013-07-01 DIAGNOSIS — M5412 Radiculopathy, cervical region: Secondary | ICD-10-CM

## 2013-07-01 DIAGNOSIS — A419 Sepsis, unspecified organism: Principal | ICD-10-CM | POA: Diagnosis present

## 2013-07-01 DIAGNOSIS — Z87891 Personal history of nicotine dependence: Secondary | ICD-10-CM

## 2013-07-01 DIAGNOSIS — L02419 Cutaneous abscess of limb, unspecified: Secondary | ICD-10-CM

## 2013-07-01 DIAGNOSIS — L02619 Cutaneous abscess of unspecified foot: Secondary | ICD-10-CM | POA: Diagnosis present

## 2013-07-01 DIAGNOSIS — Z113 Encounter for screening for infections with a predominantly sexual mode of transmission: Secondary | ICD-10-CM

## 2013-07-01 DIAGNOSIS — L089 Local infection of the skin and subcutaneous tissue, unspecified: Secondary | ICD-10-CM | POA: Diagnosis present

## 2013-07-01 DIAGNOSIS — R000 Tachycardia, unspecified: Secondary | ICD-10-CM | POA: Diagnosis present

## 2013-07-01 DIAGNOSIS — I5021 Acute systolic (congestive) heart failure: Secondary | ICD-10-CM

## 2013-07-01 DIAGNOSIS — I059 Rheumatic mitral valve disease, unspecified: Secondary | ICD-10-CM

## 2013-07-01 DIAGNOSIS — I509 Heart failure, unspecified: Secondary | ICD-10-CM | POA: Diagnosis not present

## 2013-07-01 DIAGNOSIS — T8140XA Infection following a procedure, unspecified, initial encounter: Secondary | ICD-10-CM

## 2013-07-01 DIAGNOSIS — E876 Hypokalemia: Secondary | ICD-10-CM | POA: Diagnosis not present

## 2013-07-01 DIAGNOSIS — I5020 Unspecified systolic (congestive) heart failure: Secondary | ICD-10-CM | POA: Diagnosis not present

## 2013-07-01 DIAGNOSIS — T8132XA Disruption of internal operation (surgical) wound, not elsewhere classified, initial encounter: Secondary | ICD-10-CM | POA: Diagnosis present

## 2013-07-01 DIAGNOSIS — J189 Pneumonia, unspecified organism: Secondary | ICD-10-CM

## 2013-07-01 DIAGNOSIS — Z981 Arthrodesis status: Secondary | ICD-10-CM

## 2013-07-01 DIAGNOSIS — I309 Acute pericarditis, unspecified: Secondary | ICD-10-CM

## 2013-07-01 DIAGNOSIS — R6510 Systemic inflammatory response syndrome (SIRS) of non-infectious origin without acute organ dysfunction: Secondary | ICD-10-CM | POA: Diagnosis present

## 2013-07-01 DIAGNOSIS — I319 Disease of pericardium, unspecified: Secondary | ICD-10-CM | POA: Diagnosis not present

## 2013-07-01 DIAGNOSIS — Y838 Other surgical procedures as the cause of abnormal reaction of the patient, or of later complication, without mention of misadventure at the time of the procedure: Secondary | ICD-10-CM | POA: Diagnosis present

## 2013-07-01 DIAGNOSIS — J90 Pleural effusion, not elsewhere classified: Secondary | ICD-10-CM | POA: Diagnosis not present

## 2013-07-01 DIAGNOSIS — A483 Toxic shock syndrome: Secondary | ICD-10-CM | POA: Diagnosis present

## 2013-07-01 DIAGNOSIS — B950 Streptococcus, group A, as the cause of diseases classified elsewhere: Secondary | ICD-10-CM | POA: Diagnosis present

## 2013-07-01 DIAGNOSIS — T81329A Deep disruption or dehiscence of operation wound, unspecified, initial encounter: Secondary | ICD-10-CM | POA: Diagnosis present

## 2013-07-01 DIAGNOSIS — B360 Pityriasis versicolor: Secondary | ICD-10-CM

## 2013-07-01 LAB — POCT I-STAT, CHEM 8
BUN: 18 mg/dL (ref 6–23)
Chloride: 102 mEq/L (ref 96–112)
Potassium: 4.3 mEq/L (ref 3.5–5.1)
Sodium: 139 mEq/L (ref 135–145)
TCO2: 23 mmol/L (ref 0–100)

## 2013-07-01 LAB — HEPATIC FUNCTION PANEL
ALT: 17 U/L (ref 0–35)
Albumin: 2.5 g/dL — ABNORMAL LOW (ref 3.5–5.2)
Alkaline Phosphatase: 26 U/L — ABNORMAL LOW (ref 39–117)
Bilirubin, Direct: 0.1 mg/dL (ref 0.0–0.3)
Indirect Bilirubin: 0.3 mg/dL (ref 0.3–0.9)

## 2013-07-01 LAB — SEDIMENTATION RATE: Sed Rate: 6 mm/hr (ref 0–22)

## 2013-07-01 LAB — GLUCOSE, CAPILLARY: Glucose-Capillary: 113 mg/dL — ABNORMAL HIGH (ref 70–99)

## 2013-07-01 LAB — MAGNESIUM: Magnesium: 1 mg/dL — ABNORMAL LOW (ref 1.5–2.5)

## 2013-07-01 LAB — CBC WITH DIFFERENTIAL/PLATELET
Basophils Absolute: 0 10*3/uL (ref 0.0–0.1)
Basophils Absolute: 0 10*3/uL (ref 0.0–0.1)
Basophils Relative: 0 % (ref 0–1)
Eosinophils Absolute: 0 10*3/uL (ref 0.0–0.7)
Eosinophils Relative: 0 % (ref 0–5)
Hemoglobin: 10.4 g/dL — ABNORMAL LOW (ref 12.0–15.0)
Lymphocytes Relative: 3 % — ABNORMAL LOW (ref 12–46)
MCH: 31 pg (ref 26.0–34.0)
MCHC: 35.5 g/dL (ref 30.0–36.0)
Monocytes Absolute: 0.7 10*3/uL (ref 0.1–1.0)
Neutro Abs: 10.5 10*3/uL — ABNORMAL HIGH (ref 1.7–7.7)
Neutrophils Relative %: 96 % — ABNORMAL HIGH (ref 43–77)
Neutrophils Relative %: 96 % — ABNORMAL HIGH (ref 43–77)
Platelets: 228 10*3/uL (ref 150–400)
Platelets: 191 10*3/uL (ref 150–400)
RBC: 4.12 MIL/uL (ref 3.87–5.11)
RDW: 13.1 % (ref 11.5–15.5)
RDW: 13.4 % (ref 11.5–15.5)
WBC Morphology: INCREASED
WBC: 11 10*3/uL — ABNORMAL HIGH (ref 4.0–10.5)

## 2013-07-01 LAB — CBC
MCH: 29.7 pg (ref 26.0–34.0)
MCHC: 33.7 g/dL (ref 30.0–36.0)
MCV: 88.1 fL (ref 78.0–100.0)
Platelets: 179 10*3/uL (ref 150–400)
RDW: 13.2 % (ref 11.5–15.5)

## 2013-07-01 LAB — C-REACTIVE PROTEIN: CRP: 3.9 mg/dL — ABNORMAL HIGH (ref <0.60)

## 2013-07-01 LAB — CREATININE, SERUM: Creatinine, Ser: 1.03 mg/dL (ref 0.50–1.10)

## 2013-07-01 LAB — PHOSPHORUS: Phosphorus: 3.2 mg/dL (ref 2.3–4.6)

## 2013-07-01 LAB — BASIC METABOLIC PANEL
CO2: 18 mEq/L — ABNORMAL LOW (ref 19–32)
Calcium: 6.8 mg/dL — ABNORMAL LOW (ref 8.4–10.5)
Creatinine, Ser: 1.06 mg/dL (ref 0.50–1.10)
GFR calc Af Amer: 75 mL/min — ABNORMAL LOW (ref >90)
GFR calc non Af Amer: 64 mL/min — ABNORMAL LOW (ref >90)
Glucose, Bld: 129 mg/dL — ABNORMAL HIGH (ref 70–99)
Potassium: 3.9 mEq/L (ref 3.5–5.1)

## 2013-07-01 LAB — PROCALCITONIN: Procalcitonin: 12.21 ng/mL

## 2013-07-01 LAB — MRSA PCR SCREENING: MRSA by PCR: NEGATIVE

## 2013-07-01 LAB — LACTIC ACID, PLASMA: Lactic Acid, Venous: 2.8 mmol/L — ABNORMAL HIGH (ref 0.5–2.2)

## 2013-07-01 MED ORDER — HYDROMORPHONE HCL PF 1 MG/ML IJ SOLN
1.0000 mg | Freq: Once | INTRAMUSCULAR | Status: AC
  Administered 2013-07-01: 1 mg via INTRAVENOUS
  Filled 2013-07-01: qty 1

## 2013-07-01 MED ORDER — PIPERACILLIN-TAZOBACTAM 3.375 G IVPB 30 MIN
3.3750 g | INTRAVENOUS | Status: AC
  Administered 2013-07-01: 3.375 g via INTRAVENOUS
  Filled 2013-07-01: qty 50

## 2013-07-01 MED ORDER — SODIUM CHLORIDE 0.9 % IV SOLN: 250.0000 mL | INTRAVENOUS | Status: DC | PRN

## 2013-07-01 MED ORDER — SODIUM CHLORIDE 0.9 % IV BOLUS (SEPSIS)
1000.0000 mL | Freq: Once | INTRAVENOUS | Status: AC
  Administered 2013-07-01: 1000 mL via INTRAVENOUS

## 2013-07-01 MED ORDER — ONDANSETRON HCL 4 MG/2ML IJ SOLN
4.0000 mg | Freq: Four times a day (QID) | INTRAMUSCULAR | Status: DC | PRN
  Administered 2013-07-01 (×3): 4 mg via INTRAVENOUS
  Filled 2013-07-01 (×3): qty 2

## 2013-07-01 MED ORDER — FENTANYL CITRATE 0.05 MG/ML IJ SOLN
50.0000 ug | INTRAMUSCULAR | Status: DC | PRN
  Administered 2013-07-01 – 2013-07-03 (×9): 50 ug via INTRAVENOUS
  Filled 2013-07-01 (×8): qty 2

## 2013-07-01 MED ORDER — VANCOMYCIN HCL IN DEXTROSE 1-5 GM/200ML-% IV SOLN
1000.0000 mg | Freq: Once | INTRAVENOUS | Status: AC
  Administered 2013-07-01: 1000 mg via INTRAVENOUS
  Filled 2013-07-01: qty 200

## 2013-07-01 MED ORDER — SODIUM CHLORIDE 0.9 % IV BOLUS (SEPSIS)
500.0000 mL | Freq: Once | INTRAVENOUS | Status: AC
  Administered 2013-07-01: 500 mL via INTRAVENOUS

## 2013-07-01 MED ORDER — HEPARIN SODIUM (PORCINE) 5000 UNIT/ML IJ SOLN
5000.0000 [IU] | Freq: Three times a day (TID) | INTRAMUSCULAR | Status: DC
  Administered 2013-07-01 – 2013-07-08 (×20): 5000 [IU] via SUBCUTANEOUS
  Filled 2013-07-01 (×28): qty 1

## 2013-07-01 MED ORDER — PIPERACILLIN-TAZOBACTAM 3.375 G IVPB
3.3750 g | Freq: Three times a day (TID) | INTRAVENOUS | Status: DC
  Administered 2013-07-01 – 2013-07-02 (×2): 3.375 g via INTRAVENOUS
  Filled 2013-07-01 (×4): qty 50

## 2013-07-01 MED ORDER — SODIUM CHLORIDE 0.9 % IV SOLN
INTRAVENOUS | Status: AC
  Administered 2013-07-01: 1000 mL via INTRAVENOUS

## 2013-07-01 MED ORDER — SODIUM CHLORIDE 0.9 % IV SOLN
INTRAVENOUS | Status: DC
  Administered 2013-07-01: 12:00:00 via INTRAVENOUS

## 2013-07-01 MED ORDER — SODIUM CHLORIDE 0.9 % IV SOLN
750.0000 mL | INTRAVENOUS | Status: DC | PRN
  Administered 2013-07-01: 750 mL via INTRAVENOUS
  Administered 2013-07-01: 1000 mL via INTRAVENOUS
  Administered 2013-07-01: 17:00:00 via INTRAVENOUS
  Administered 2013-07-01 – 2013-07-02 (×4): 750 mL via INTRAVENOUS

## 2013-07-01 MED ORDER — INFLUENZA VAC SPLIT QUAD 0.5 ML IM SUSP
0.5000 mL | INTRAMUSCULAR | Status: DC | PRN
  Filled 2013-07-01: qty 0.5

## 2013-07-01 MED ORDER — VANCOMYCIN HCL IN DEXTROSE 750-5 MG/150ML-% IV SOLN
750.0000 mg | Freq: Three times a day (TID) | INTRAVENOUS | Status: DC
  Administered 2013-07-01 – 2013-07-02 (×3): 750 mg via INTRAVENOUS
  Filled 2013-07-01 (×5): qty 150

## 2013-07-01 MED ORDER — PANTOPRAZOLE SODIUM 40 MG IV SOLR
40.0000 mg | Freq: Every day | INTRAVENOUS | Status: DC
  Administered 2013-07-01 – 2013-07-02 (×2): 40 mg via INTRAVENOUS
  Filled 2013-07-01 (×3): qty 40

## 2013-07-01 MED ORDER — MAGNESIUM SULFATE 40 MG/ML IJ SOLN
4.0000 g | Freq: Once | INTRAMUSCULAR | Status: AC
  Administered 2013-07-01: 4 g via INTRAVENOUS
  Filled 2013-07-01: qty 100

## 2013-07-01 MED ORDER — PROMETHAZINE HCL 25 MG/ML IJ SOLN
12.5000 mg | Freq: Four times a day (QID) | INTRAMUSCULAR | Status: DC | PRN
  Filled 2013-07-01: qty 1

## 2013-07-01 MED ORDER — ACETAMINOPHEN 10 MG/ML IV SOLN
1000.0000 mg | Freq: Four times a day (QID) | INTRAVENOUS | Status: AC | PRN
  Administered 2013-07-01 – 2013-07-02 (×4): 1000 mg via INTRAVENOUS
  Filled 2013-07-01 (×4): qty 100

## 2013-07-01 MED ORDER — NOREPINEPHRINE BITARTRATE 1 MG/ML IJ SOLN
2.0000 ug/min | INTRAVENOUS | Status: DC
  Administered 2013-07-01: 5 ug/min via INTRAVENOUS
  Administered 2013-07-01 – 2013-07-02 (×2): 6 ug/min via INTRAVENOUS
  Administered 2013-07-02: 8 ug/min via INTRAVENOUS
  Filled 2013-07-01 (×3): qty 4

## 2013-07-01 MED ORDER — GADOBENATE DIMEGLUMINE 529 MG/ML IV SOLN
15.0000 mL | Freq: Once | INTRAVENOUS | Status: AC
  Administered 2013-07-01: 14 mL via INTRAVENOUS

## 2013-07-01 NOTE — Progress Notes (Signed)
cxr - clear  LABS Low mag    Passive Leg Raise test  - no aline to assess response  - aptient cvp 8 and has goten 7L fluids  SUpinne  HR 133, BP 106 sbp, MPA 70 Legs 45 degree  HR 131, SBP 110, MAP 66  PLAn Quick 500cc bolus over 15 min and repeat above test Mag sulfate 4gm   Dr. Kalman Shan, M.D., [facility].C.P Pulmonary and Critical Care Medicine Staff Physician [facility] [place] [facility]monary and Critical Care Pager: [phone], If no answer or between  15:00h - 7:00h: call [phone]  07/01/2013 7:14 PM

## 2013-07-01 NOTE — Progress Notes (Signed)
UR Completed.  Vangie Bicker [phone] 07/01/2013

## 2013-07-01 NOTE — H&P (Signed)
PULMONARY  / CRITICAL CARE MEDICINE  Name: Shelly [name] MRN: [id number] DOB: 11-02-1970    ADMISSION DATE:  07/01/2013 CONSULTATION DATE:  07/01/2013  REFERRING MD :  Rubin Payor (ED) PRIMARY SERVICE: PCCM  CHIEF COMPLAINT:  Hypotension/Sepsis  BRIEF PATIENT DESCRIPTION: 42 y.o. F who underwent right great toe chilectomy 1 month ago (Dr. Eulah Pont).  Had stitches removed post op week 2 - 3, then wound began to dehisce.  On 11/17, pt began to have fever/chills and generalized weakness/not feeling well.  Came to ER 11/18, PCCM consulted for Sepsis/hypotension.  SIGNIFICANT EVENTS / STUDIES:  10/20 Right great toe chilectomy 11/17 Began to experience fever/chills/weakness. 11/18 Presented to ER, found to be hypotensive and tachycardic.  XRay of R great toe showed no acute fracture or evidence of osteo.  MRI of R great toe showed no evidence of osteo/tenosynovitis/septic joint.  Did show dorsal soft tissue abscess, 6cm diameter.  LINES / TUBES:  CULTURES: Blood 11/18 >>> Urine 11/18 >>> Wound 11/18 >>>  ANTIBIOTICS: Vanc 11/18 >>> Zosyn 11/18 >>>  HISTORY OF PRESENT ILLNESS:  [name] is a 42 y.o. Female who underwent a right great toe chilectomy on 10/20, performed by Dr. Eulah Pont.  She states that surgery went well and 2 weeks post op, she went to Dr. Greig Right office for suture removal.  At that time, they removed most of the sutures but not all.  1 week later, she returned to the office for removal of remaining sutures.  All sutures removed and steri strips were placed.  Yesterday, she returned to the office because the wound began to swell more and also had some drainage.  Wet to dry dressings were placed in the office and pt returned home.  Later in the day, she began to feel worse with fevers, chills, and generalized weakness.  She presented to the ER early this AM (11/18) because she felt much worse.  In the ED, pt noted to be hypotensive with nadir SBP in  80's.  3 L NS boluses were  infused and pt was started on Vancomycin. Ortho PA did see the pt and re-dressed the wound.  I&D of abscess was deferred until Dr. Eulah Pont can come and see the pt later today. PCCM consulted for further management of hypotension and sepsis.  During our exam, pt denies any chest pain, SOB, worsened pain, N/V/D.  PAST MEDICAL HISTORY :  Past Medical History  Diagnosis Date  . GERD (gastroesophageal reflux disease)   . Medical history non-contributory    Past Surgical History  Procedure Laterality Date  . Cesarean section  2008    complete placenta pervia, hemmorage  . Knee surgery  [phone]    7 surgeries left knee(2 lateral releases, 1 tibial tubercle oxtomy, 1 patellectomy)  . Cervical fusion  2011  . Cheilectomy Left 05/01/2013    Procedure: LEFT GREAT TOE HALLUX RIGIDUS WITH CHEILECTOMY 1ST METATARSOPHALANGEAL;  Surgeon: Loreta Ave, MD;  Location: [facility];  Service: Orthopedics;  Laterality: Left;  . Carpal tunnel release Left   . Cheilectomy Right 06/02/2013    procedule:right great toe chielectomy   Prior to Admission medications   Medication Sig Start Date End Date Taking? Authorizing Provider  acetaminophen (TYLENOL) 500 MG tablet Take 1,000 mg by mouth every 6 (six) hours as needed for mild pain.   Yes Historical Provider, MD  ibuprofen (ADVIL,MOTRIN) 200 MG tablet Take 400 mg by mouth every 6 (six) hours as needed for moderate pain.    Yes  Historical Provider, MD  Multiple Vitamin (MULTIVITAMIN) tablet Take 1 tablet by mouth daily.     Yes Historical Provider, MD   Allergies  Allergen Reactions  . Codeine     REACTION: Hives    FAMILY HISTORY:  Family History  Problem Relation Age of Onset  . Hypertension Mother   . Hyperlipidemia Mother   . Hypertension Father   . Alzheimer's disease Father   . Migraines Sister   . Kidney disease Maternal Grandmother   . Heart attack Maternal Grandfather 60   SOCIAL HISTORY:  reports that she quit smoking  about 18 years ago. She does not have any smokeless tobacco history on file. She reports that she does not drink alcohol or use illicit drugs.  REVIEW OF SYSTEMS:  Negative except as stated in HPI.  SUBJECTIVE:  C/o generalized pain, including shooting pain up right leg VITAL SIGNS: Temp:  [97.8 F (36.6 C)-101.6 F (38.7 C)] 101.6 F (38.7 C) (11/18 0939) Pulse Rate:  [115-135] 134 (11/18 1000) Resp:  [16-20] 20 (11/18 0939) BP: (85-119)/(34-60) 91/34 mmHg (11/18 1000) SpO2:  [93 %-100 %] 99 % (11/18 1000) HEMODYNAMICS:   VENTILATOR SETTINGS:   INTAKE / OUTPUT: Intake/Output     11/17 0701 - 11/18 0700 11/18 0701 - 11/19 0700   I.V.  2000   Total Intake   2000   Net   +2000          PHYSICAL EXAMINATION: General:  Pleasant female, lying in bed, ill appearing. Neuro:  A&O x 3.  Mood and affect appropriate. HEENT:  [place]/AT.  PERRL.  MMM. Cardiovascular:  Tachycardic, no M/R/G. Lungs:  Resps even and unlabored.  CTA bilaterally, no W/R/R. Abdomen:  BS x 4.  Soft, NT/ND. Musculoskeletal:  Dressing of right great toe in place, C/D/I.  No edema. Skin:  Very warm to touch and dry.  LABS:  CBC  Recent Labs Lab 07/01/13 0540 07/01/13 0549  WBC 11.0*  --   HGB 12.4 12.6  HCT 36.6 37.0  PLT 228  --    BMET  Recent Labs Lab 07/01/13 0549  NA 139  K 4.3  CL 102  BUN 18  CREATININE 1.00  GLUCOSE 120*   Sepsis Markers  Recent Labs Lab 07/01/13 1020  LATICACIDVEN 1.43   Imaging Mr Foot Right W Wo Contrast  07/01/2013   CLINICAL DATA:  Large nonhealing wound on the dorsal aspect of the foot.  EXAM: MRI OF THE RIGHT FOREFOOT WITHOUT AND WITH CONTRAST  TECHNIQUE: Multiplanar, multisequence MR imaging was performed both before and after administration of intravenous contrast.  CONTRAST:  14mL MULTIHANCE GADOBENATE DIMEGLUMINE 529 MG/ML IV SOLN  COMPARISON:  Radiographs dated 07/01/2013  FINDINGS: Soft tissue wound is seen in the subcutaneous fat of the dorsum of  the foot overlying the head of the 1st metatarsal. There is no significant joint effusion. Minimal edema in the head of the 1st metatarsal is consistent with recent surgery. The finding is not suggestive of osteomyelitis.  There is an abscess which extends laterally across the dorsum of the foot in the subcutaneous fat. There is extensive nonenhancing fluid in the dorsum of the foot which could represent edema or pus. This is contiguous with the abscess which extends medially from the soft tissue wound. This collection of fluid extends over a 6 cm diameter area on the dorsum of the foot.  The other visualized bones of the forefoot are normal. Muscles and tendons appear normal.  After contrast administration  there is enhancement of the soft tissues around the dorsal aspect of the 1st metatarsal phalangeal joint consistent with cellulitis.  IMPRESSION: 1. No evidence of osteomyelitis or tenosynovitis. No evidence of septic 1st metatarsal phalangeal joint. 2. Dorsal soft tissue abscess extending laterally across the dorsum of the foot from the soft tissue wound. The abnormal fluid collection extends in a 6 cm diameter area. Some of this could be pus and some could be reactive edema but I cannot differentiate between the two.   Electronically Signed   By: Geanie Cooley M.D.   On: 07/01/2013 09:55   Dg Foot Complete Right  07/01/2013   CLINICAL DATA:  Swelling under postoperative 2nd metatarsal site. Evaluate for osteomyelitis.  EXAM: RIGHT FOOT COMPLETE - 3+ VIEW  COMPARISON:  None available for comparison at time of study interpretation.  FINDINGS: No acute fracture deformity or dislocation. Mild 1st metatarsophalangeal joint space narrowing with periarticular sclerosis and marginal spurring consistent with osteoarthrosis. Joint space intact without erosions. No destructive bony lesions. Soft tissue planes are not suspicious.  IMPRESSION: No acute fracture deformity or dislocation. No radiographic findings of  osteomyelitis though, if suspicion persists, MRI would be more sensitive.  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   Electronically Signed   By: Awilda Metro   On: 07/01/2013 06:42     CXR: None  ASSESSMENT / PLAN:  INFECTIOUS A:   Sepsis/Septic Shock - presumed secondary to right great toe infection s/p surgery 1 month ago. Concern for infection of right great toe. P:   - Admit to ICU. - Empiric abx with Vanc/Zosyn. - Check  PCT. - Monitor WBCs/fever curve.. - f/u cultures.  CARDIOVASCULAR A:  Septic Shock P:  - See above (ID section). - IVF resuscitation with NS @ 125. - Goal MAP >= 65. - Goal UOP > 0.60ml/kg/hr - Will likely need CVC. - Repeat lactate  - If BP/MAP does not respond to IVF, will start Levophed and titrate to goal MAP  PULMONARY A: No acute issue. P:   - Monitor respiratory status. - No indication for daily CXR at this time.  RENAL A:   No acute issue. P:   - Monitor BMP.  GASTROINTESTINAL A:  No acute issue. P:   - NPO for now. - SUP: pantoprazole.  HEMATOLOGIC A:  No acute issue. P:  - DVT Px: Heparin SQ.  ENDOCRINE A: No acute issue.   P:   - Monitor CBG's. - f/u Cortisol, may need stress steroids if low.  NEUROLOGIC A:  No acute issue. P:   - Monitor.  Rutherford Guys, PA -S  Admit to ICU for treatment of sepsis, place central line, sepsis protocol, follow CVP and called ortho to take patient to the OR for drainage.  CRITICAL CARE: The patient is critically ill with multiple organ systems failure and requires high complexity decision making for assessment and support, frequent evaluation and titration of therapies, application of advanced monitoring technologies and extensive interpretation of multiple databases. Critical Care Time devoted to patient care services described in this note is 45 minutes.   Alyson Reedy, M.D. Pulmonary and Critical Care Medicine [facility] Pager: [phone]  07/01/2013, 10:57  AM

## 2013-07-01 NOTE — ED Notes (Signed)
Ortho at bedside, cleaning and examiining wound

## 2013-07-01 NOTE — Progress Notes (Signed)
ANTIBIOTIC CONSULT NOTE - INITIAL  Pharmacy Consult for Vancomycin and Zosyn Indication: sepsis - suspected source R great toe infection  Allergies  Allergen Reactions  . Codeine     REACTION: Hives    Patient Measurements:    Ht: 66 in    Wt: ~150 lb   Vital Signs: Temp: 101.6 F (38.7 C) (11/18 0939) Temp src: Oral (11/18 0939) BP: 82/37 mmHg (11/18 1100) Pulse Rate: 130 (11/18 1100) Intake/Output from previous day:   Intake/Output from this shift: Total I/O In: 2000 [I.V.:2000] Out: -   Labs:  Recent Labs  07/01/13 0540 07/01/13 0549  WBC 11.0*  --   HGB 12.4 12.6  PLT 228  --   CREATININE  --  1.00   The CrCl is unknown because both a height and weight (above a minimum accepted value) are required for this calculation. No results found for this basename: VANCOTROUGH, VANCOPEAK, VANCORANDOM, GENTTROUGH, GENTPEAK, GENTRANDOM, TOBRATROUGH, TOBRAPEAK, TOBRARND, AMIKACINPEAK, AMIKACINTROU, AMIKACIN,  in the last 72 hours   Microbiology: No results found for this or any previous visit (from the past 720 hour(s)).  Medical History: Past Medical History  Diagnosis Date  . GERD (gastroesophageal reflux disease)   . Medical history non-contributory     Medications:  See electronic home med rec  Assessment: 42 y.o. female s/p R great toe surgery ~ 1mos ago presents with fever/chills. Found to have hypotension/sepsis. MRI of R great toe showed no evidence of osteo but does show soft tissue abscess. SCr 1, estimated CrCl 80 ml/min. Wbc slightly elevated. LA wnl. Tm 101.6.  Pt received 1gm Vanc in ED 0550. No Zosyn given yet.   Goal of Therapy:  Vancomycin trough level 15-20 mcg/ml  Plan:  1. Vancomycin 750mg  IV q8h. 2. Zosyn 3.375gm IV over 30 min stat then 3.375gm IV q8h - subsequent doses over 4 hours 3. Will f/u renal function, micro data, pt's clinical condition, trough prn  Christoper Fabian, PharmD, BCPS Clinical pharmacist, pager [phone] 07/01/2013,11:35  AM

## 2013-07-01 NOTE — Progress Notes (Signed)
Pt felt very warm to the touch- checked a temperature (axillary as pt had just had water) of 103.5. IV tylenol ordered from pharmacy, blankets removed, etc. Will recheck after tylenol is administered.

## 2013-07-01 NOTE — ED Notes (Signed)
Remains in MRI 

## 2013-07-01 NOTE — Progress Notes (Signed)
RN calling new onset chest tightness  O Looks stable on camera exam S/p 7L fluids  P cxr ekg Bmet, cbc,  Cardiac enz   Dr. Kalman Shan, M.D., [facility].C.P Pulmonary and Critical Care Medicine Staff Physician [facility] Pulmonary and Critical Care Pager: [phone], If no answer or between  15:00h - 7:00h: call [phone]  07/01/2013 5:34 PM

## 2013-07-01 NOTE — Progress Notes (Signed)
Post fluid 500cc bolus  Supine SBP 98 with HR 123 PLTRT sbp 97 with HR 122  CVP now 9  Plan CVP goal > 10 (change from 12)  Dr. Kalman Shan, M.D., [facility].C.P Pulmonary and Critical Care Medicine Staff Physician [facility] [place] Pulmonary and Critical Care Pager: [phone], If no answer or between  15:00h - 7:00h: call [phone]  07/01/2013 8:23 PM

## 2013-07-01 NOTE — ED Notes (Signed)
Pt states she recently had bone spurs removed from her right foot in October and since Thursday night the pain and swelling have been increasingly worse. C/o fever as well at home but did take some some medication.

## 2013-07-01 NOTE — ED Notes (Signed)
Patient transported to MRI 

## 2013-07-01 NOTE — ED Provider Notes (Signed)
CSN: [id number]     Arrival date & time 07/01/13  4098 History   First MD Initiated Contact with Patient 07/01/13 0518     Chief Complaint  Patient presents with  . Foot Pain   (Consider location/radiation/quality/duration/timing/severity/associated sxs/prior Treatment) HPI Comments: 42 year old female who has a history of foot surgery 4 weeks ago, presents with a chief complaint of foot pain and swelling. She states that this started several months ago, so he has been gradually getting worse and is now associated with redness. The wound has opened up and there is some drainage from the wound. She has significant pain with palpation of the foot as well as with ambulation and admits to having a borderline fever at home. She was seen at the orthopedic offices earlier in the day, she has not been on antibiotics at this time and has not been taking pain medications at home. She is not a diabetic, she is not immunosuppressed.  She has been eating and drinking though she states that she if very thirsty today  Cheilectomy - 4 weeks ago  Patient is a 42 y.o. female presenting with lower extremity pain. The history is provided by the patient.  Foot Pain    Past Medical History  Diagnosis Date  . GERD (gastroesophageal reflux disease)   . Medical history non-contributory    Past Surgical History  Procedure Laterality Date  . Cesarean section  2008    complete placenta pervia, hemmorage  . Knee surgery  [phone]    7 surgeries left knee(2 lateral releases, 1 tibial tubercle oxtomy, 1 patellectomy)  . Cervical fusion  2011  . Cheilectomy Left 05/01/2013    Procedure: LEFT GREAT TOE HALLUX RIGIDUS WITH CHEILECTOMY 1ST METATARSOPHALANGEAL;  Surgeon: Loreta Ave, MD;  Location: [facility];  Service: Orthopedics;  Laterality: Left;  . Carpal tunnel release Left    Family History  Problem Relation Age of Onset  . Hypertension Mother   . Hyperlipidemia Mother   . Hypertension  Father   . Alzheimer's disease Father   . Migraines Sister   . Kidney disease Maternal Grandmother   . Heart attack Maternal Grandfather 63   History  Substance Use Topics  . Smoking status: Former Smoker    Quit date: 11/03/1994  . Smokeless tobacco: Not on file  . Alcohol Use: No   OB History   Grav Para Term Preterm Abortions TAB SAB Ect Mult Living                 Review of Systems  All other systems reviewed and are negative.    Allergies  Codeine  Home Medications   Current Outpatient Rx  Name  Route  Sig  Dispense  Refill  . ibuprofen (ADVIL,MOTRIN) 200 MG tablet   Oral   Take 200 mg by mouth as needed.           . Multiple Vitamin (MULTIVITAMIN) tablet   Oral   Take 1 tablet by mouth daily.           Marland Kitchen oxyCODONE-acetaminophen (PERCOCET) 7.5-325 MG per tablet   Oral   Take 1 tablet by mouth every 4 (four) hours as needed for pain.   60 tablet   0    BP 119/58  Pulse 118  Temp(Src) 97.8 F (36.6 C) (Oral)  Resp 16  SpO2 98%  LMP 04/23/2013 Physical Exam  Nursing note and vitals reviewed. Constitutional: She appears well-developed and well-nourished. No distress.  HENT:  Head: Normocephalic and atraumatic.  Eyes: Conjunctivae are normal. Right eye exhibits no discharge. Left eye exhibits no discharge. No scleral icterus.  Cardiovascular: Regular rhythm, normal heart sounds and intact distal pulses.  Exam reveals no gallop and no friction rub.   No murmur heard. Tachycardiac to 110  Pulmonary/Chest: Effort normal and breath sounds normal. No respiratory distress. She has no wheezes. She has no rales.  Musculoskeletal: Normal range of motion. She exhibits tenderness ( ttp overlying the R forsum of the great toe as well as with palpation on the plantar aspect of the foot). She exhibits no edema.  Neurological: She is alert. Coordination normal.  Normal sensation of the foot  Skin: Skin is warm and dry. There is erythema.  Open draining wound  with serous drainage - no foul smelling drainage.    Psychiatric: She has a normal mood and affect. Her behavior is normal.    ED Course  Procedures (including critical care time) Labs Review Labs Reviewed  WOUND CULTURE  CBC WITH DIFFERENTIAL   Imaging Review No results found.  EKG Interpretation   None       MDM  No diagnosis found. Pt has signs of soft tissue infection - has asymetric swelling of the foot - imaging to evaluate for osteomyelitis.  She has no objective fever here but has a mild tachycardia.  She will need xray to eval for osteomyelitis =- abx ordered, wound culture ordered.  Otherwise, no n/v.  Concern for infection needing IV Abx, pt pending imaging at change of shift - care signed out to Dr. Rubin Payor.  Vida Roller, MD 07/16/13 1005

## 2013-07-01 NOTE — Consult Note (Signed)
Reason for Consult:right great toe infection    POST OP 06/02/2013 Referring Physician: Benjiman Core MD in the ER  Shelly [name] is an 42 y.o. female.  HPI: 25 yowf is 1 month s/p right great toe chilectomy.  Post op week one wound OK.  Post op week 2 1/2 the stiches removed.   Post op week three rest of stitches removed.  Wound started to open.  Seen in the office yesterday.  Placed on Wet to dry dressing changes.  Came to the ER last night due to fever chills and being systemically ill.  Today wound is draining  1st MTP on her right foot is red and swollen.  Patient is hypotension and tachycardic.  She is being admitted by medicine and critical care for treatment of acute septicemia.  Past Medical History  Diagnosis Date  . GERD (gastroesophageal reflux disease)   . Medical history non-contributory     Past Surgical History  Procedure Laterality Date  . Cesarean section  2008    complete placenta pervia, hemmorage  . Knee surgery  [phone]    7 surgeries left knee(2 lateral releases, 1 tibial tubercle oxtomy, 1 patellectomy)  . Cervical fusion  2011  . Cheilectomy Left 05/01/2013    Procedure: LEFT GREAT TOE HALLUX RIGIDUS WITH CHEILECTOMY 1ST METATARSOPHALANGEAL;  Surgeon: Loreta Ave, MD;  Location: [facility];  Service: Orthopedics;  Laterality: Left;  . Carpal tunnel release Left   . Cheilectomy Right 06/02/2013    procedule:right great toe chielectomy    Family History  Problem Relation Age of Onset  . Hypertension Mother   . Hyperlipidemia Mother   . Hypertension Father   . Alzheimer's disease Father   . Migraines Sister   . Kidney disease Maternal Grandmother   . Heart attack Maternal Grandfather 49    Social History:  reports that she quit smoking about 18 years ago. She does not have any smokeless tobacco history on file. She reports that she does not drink alcohol or use illicit drugs.  Allergies:  Allergies  Allergen Reactions  . Codeine      REACTION: Hives    Medications: I have reviewed the patient's current medications. No current facility-administered medications on file prior to encounter.   Current Outpatient Prescriptions on File Prior to Encounter  Medication Sig Dispense Refill  . ibuprofen (ADVIL,MOTRIN) 200 MG tablet Take 400 mg by mouth every 6 (six) hours as needed for moderate pain.       . Multiple Vitamin (MULTIVITAMIN) tablet Take 1 tablet by mouth daily.          Results for orders placed during the hospital encounter of 07/01/13 (from the past 48 hour(s))  CBC WITH DIFFERENTIAL     Status: Abnormal   Collection Time    07/01/13  5:40 AM      Result Value Range   WBC 11.0 (*) 4.0 - 10.5 K/uL   RBC 4.12  3.87 - 5.11 MIL/uL   Hemoglobin 12.4  12.0 - 15.0 g/dL   HCT 47.8  29.5 - 62.1 %   MCV 88.8  78.0 - 100.0 fL   MCH 30.1  26.0 - 34.0 pg   MCHC 33.9  30.0 - 36.0 g/dL   RDW 30.8  65.7 - 84.6 %   Platelets 228  150 - 400 K/uL   Neutrophils Relative % 96 (*) 43 - 77 %   Neutro Abs 10.5 (*) 1.7 - 7.7 K/uL   Lymphocytes Relative 3 (*)  12 - 46 %   Lymphs Abs 0.3 (*) 0.7 - 4.0 K/uL   Monocytes Relative 1 (*) 3 - 12 %   Monocytes Absolute 0.1  0.1 - 1.0 K/uL   Eosinophils Relative 0  0 - 5 %   Eosinophils Absolute 0.0  0.0 - 0.7 K/uL   Basophils Relative 0  0 - 1 %   Basophils Absolute 0.0  0.0 - 0.1 K/uL  POCT I-STAT, CHEM 8     Status: Abnormal   Collection Time    07/01/13  5:49 AM      Result Value Range   Sodium 139  135 - 145 mEq/L   Potassium 4.3  3.5 - 5.1 mEq/L   Chloride 102  96 - 112 mEq/L   BUN 18  6 - 23 mg/dL   Creatinine, Ser 0.45  0.50 - 1.10 mg/dL   Glucose, Bld 409 (*) 70 - 99 mg/dL   Calcium, Ion 8.11  1.12 - 1.23 mmol/L   TCO2 23  0 - 100 mmol/L   Hemoglobin 12.6  12.0 - 15.0 g/dL   HCT 91.4  78.2 - 95.6 %    Mr Foot Right W Wo Contrast  07/01/2013   CLINICAL DATA:  Large nonhealing wound on the dorsal aspect of the foot.  EXAM: MRI OF THE RIGHT FOREFOOT WITHOUT AND  WITH CONTRAST  TECHNIQUE: Multiplanar, multisequence MR imaging was performed both before and after administration of intravenous contrast.  CONTRAST:  14mL MULTIHANCE GADOBENATE DIMEGLUMINE 529 MG/ML IV SOLN  COMPARISON:  Radiographs dated 07/01/2013  FINDINGS: Soft tissue wound is seen in the subcutaneous fat of the dorsum of the foot overlying the head of the 1st metatarsal. There is no significant joint effusion. Minimal edema in the head of the 1st metatarsal is consistent with recent surgery. The finding is not suggestive of osteomyelitis.  There is an abscess which extends laterally across the dorsum of the foot in the subcutaneous fat. There is extensive nonenhancing fluid in the dorsum of the foot which could represent edema or pus. This is contiguous with the abscess which extends medially from the soft tissue wound. This collection of fluid extends over a 6 cm diameter area on the dorsum of the foot.  The other visualized bones of the forefoot are normal. Muscles and tendons appear normal.  After contrast administration there is enhancement of the soft tissues around the dorsal aspect of the 1st metatarsal phalangeal joint consistent with cellulitis.  IMPRESSION: 1. No evidence of osteomyelitis or tenosynovitis. No evidence of septic 1st metatarsal phalangeal joint. 2. Dorsal soft tissue abscess extending laterally across the dorsum of the foot from the soft tissue wound. The abnormal fluid collection extends in a 6 cm diameter area. Some of this could be pus and some could be reactive edema but I cannot differentiate between the two.   Electronically Signed   By: Geanie Cooley M.D.   On: 07/01/2013 09:55   Dg Foot Complete Right  07/01/2013   CLINICAL DATA:  Swelling under postoperative 2nd metatarsal site. Evaluate for osteomyelitis.  EXAM: RIGHT FOOT COMPLETE - 3+ VIEW  COMPARISON:  None available for comparison at time of study interpretation.  FINDINGS: No acute fracture deformity or dislocation.  Mild 1st metatarsophalangeal joint space narrowing with periarticular sclerosis and marginal spurring consistent with osteoarthrosis. Joint space intact without erosions. No destructive bony lesions. Soft tissue planes are not suspicious.  IMPRESSION: No acute fracture deformity or dislocation. No radiographic findings of osteomyelitis though,  if suspicion persists, MRI would be more sensitive.  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   Electronically Signed   By: Awilda Metro   On: 07/01/2013 06:42    Review of Systems  Constitutional: Positive for fever, chills and diaphoresis.  HENT: Negative for congestion, ear discharge, ear pain, hearing loss, nosebleeds, sore throat and tinnitus.   Eyes: Negative for blurred vision, double vision, photophobia, pain, discharge and redness.  Respiratory: Negative for cough, hemoptysis, sputum production, shortness of breath, wheezing and stridor.   Cardiovascular: Negative for chest pain, palpitations, orthopnea, claudication, leg swelling and PND.  Gastrointestinal: Negative for heartburn, nausea, vomiting, abdominal pain, diarrhea, constipation, blood in stool and melena.  Genitourinary: Negative for dysuria, urgency, frequency, hematuria and flank pain.  Musculoskeletal: Positive for joint pain. Negative for back pain, falls, myalgias and neck pain.  Skin: Negative for itching and rash.  Neurological: Negative.  Negative for headaches.  Endo/Heme/Allergies: Negative.   Psychiatric/Behavioral: Negative.    Blood pressure 88/57, pulse 129, temperature 101.6 F (38.7 C), temperature source Oral, resp. rate 20, last menstrual period 04/23/2013, SpO2 95.00%. Physical Exam  Constitutional: She is oriented to person, place, and time. She appears well-developed and well-nourished. She appears distressed.  HENT:  Head: Normocephalic and atraumatic.  Eyes: Conjunctivae and EOM are normal. Pupils are equal, round, and reactive to light.  Neck: Normal range of motion.  Neck supple.  Cardiovascular: Regular rhythm.   tachycardia  Respiratory: Effort normal and breath sounds normal.  GI: Soft. Bowel sounds are normal.  Genitourinary:  Not pertinent to current symptomatology therefore not examined.  Musculoskeletal:  Right great toe red swollen draining  Open wound.  Tender   2+ DP pulse  Neurological: She is alert and oriented to person, place, and time.  Skin: Skin is warm and dry. There is erythema. There is pallor.  Psychiatric: She has a normal mood and affect.    Assessment Principal Problem:   SIRS (systemic inflammatory response syndrome) Active Problems:   Right foot infection   Hypotension   Tachycardia   Plan: Culture of the wound and her blood have been sent.  Wound cultures were done prior to antibiotics.  Blood cultures were just sent.  I have copiously washed her wound and will let it continue to drain Dr Mckinley Jewel to follow this patient from an orthopedic standpoint  Pascal Lux 07/01/2013, 10:02 AM

## 2013-07-01 NOTE — ED Notes (Signed)
Remains in mri 

## 2013-07-01 NOTE — ED Notes (Signed)
Dr. Miller at the bedside.  

## 2013-07-01 NOTE — Procedures (Signed)
Central Venous Catheter Insertion Procedure Note Tvisha Schwoerer [id number] [birth year]  Procedure: Insertion of Central Venous Catheter Indications: Assessment of intravascular volume, Drug and/or fluid administration and Frequent blood sampling  Procedure Details Consent: Risks of procedure as well as the alternatives and risks of each were explained to the (patient/caregiver).  Consent for procedure obtained. Time Out: Verified patient identification, verified procedure, site/side was marked, verified correct patient position, special equipment/implants available, medications/allergies/relevent history reviewed, required imaging and test results available.  Performed  Maximum sterile technique was used including antiseptics, cap, gloves, gown, hand hygiene, mask and sheet. Skin prep: Chlorhexidine; local anesthetic administered A antimicrobial bonded/coated triple lumen catheter was placed in the right internal jugular vein using the Seldinger technique.  Evaluation Blood flow good Complications: No apparent complications Patient did tolerate procedure well. Chest X-ray ordered to verify placement.  CXR: pending.  Rutherford Guys, PA - S 07/01/2013, 12:48 PM  U/S used in placement.  Scrubbed in and assisted with procedure.  Alyson Reedy, M.D. [facility] Pulmonary/Critical Care Medicine. Pager: [phone]. After hours pager: [phone].

## 2013-07-01 NOTE — ED Notes (Signed)
0920 pt returned from MRI and vital signs were updated, bp was in 80's and hr in 130-140's, dr pickering, dr Sunnie Nielsen and ortho midlevel all made aware of same and fluids ordered will reevaluate after first fluid bolus, pt remains alert and oriented at this time.

## 2013-07-01 NOTE — Consult Note (Signed)
Shelly [name] is a healthy 42 yo female well known to me. S/P Left foot cheilectomy 2 months ago doing well. S/P cheilectomy Right foot first MTP joint one month age initially doing well. Seen in our office 11/17 with mild wound dehisence and NO evidence of infection locally or systemic. Admitted now less than 24 hrs later with systemic infection and dramatic localized abcess on Right. Cannot explain this explosive progression in otherwise healthy person. Seen and examined by me today. Appreciate Medical and ID help. I plan I&D of toe in main OR tomorrow. Already draining wound allowing some decompression. Await cultures.  Will follow with current management team. Dr Hazem Kenner 

## 2013-07-01 NOTE — ED Provider Notes (Signed)
Received patient in signout. Has been tachycardic from infection in foot. Initially admitted to internal medicine after discussion with orthopedic surgery. However while in the ED patient became hypotensive. She started 2 L of fluid and will receive more boluses. Zosyn and vancomycin were given. Lactate had been ordered, however patient is an MRI and have not been drawn yet. After discussion with internal medicine critical care was consulted. They recommended more IV fluids and will follow lactate. Blood cultures are been added, although the patient is guarded but given antibiotics.  CRITICAL CARE Performed by: Billee Cashing Total critical care time: 30 Critical care time was exclusive of separately billable procedures and treating other patients. Critical care was necessary to treat or prevent imminent or life-threatening deterioration. Critical care was time spent personally by me on the following activities: development of treatment plan with patient and/or surrogate as well as nursing, discussions with consultants, evaluation of patient's response to treatment, examination of patient, obtaining history from patient or surrogate, ordering and performing treatments and interventions, ordering and review of laboratory studies, ordering and review of radiographic studies, pulse oximetry and re-evaluation of patient's condition.   Juliet Rude. Rubin Payor, MD 07/01/13 1012

## 2013-07-02 ENCOUNTER — Inpatient Hospital Stay (HOSPITAL_COMMUNITY): Payer: BC Managed Care – PPO | Admitting: Anesthesiology

## 2013-07-02 ENCOUNTER — Encounter (HOSPITAL_COMMUNITY): Payer: BC Managed Care – PPO | Admitting: Anesthesiology

## 2013-07-02 ENCOUNTER — Encounter (HOSPITAL_COMMUNITY)
Admission: EM | Disposition: A | Discharge status: Home / Self Care | Payer: Self-pay | Source: Home / Self Care | Attending: Internal Medicine

## 2013-07-02 ENCOUNTER — Encounter (HOSPITAL_COMMUNITY): Payer: Self-pay | Admitting: Anesthesiology

## 2013-07-02 ENCOUNTER — Inpatient Hospital Stay (HOSPITAL_COMMUNITY): Payer: BC Managed Care – PPO

## 2013-07-02 DIAGNOSIS — L089 Local infection of the skin and subcutaneous tissue, unspecified: Secondary | ICD-10-CM

## 2013-07-02 DIAGNOSIS — Z113 Encounter for screening for infections with a predominantly sexual mode of transmission: Secondary | ICD-10-CM

## 2013-07-02 HISTORY — PX: I & D EXTREMITY: SHX5045

## 2013-07-02 LAB — TROPONIN I
Troponin I: <0.3 ng/mL (ref <0.30)
Troponin I: <0.3 ng/mL (ref <0.30)

## 2013-07-02 LAB — BASIC METABOLIC PANEL
CO2: 19 mEq/L (ref 19–32)
Calcium: 6.4 mg/dL — CL (ref 8.4–10.5)
Creatinine, Ser: 0.95 mg/dL (ref 0.50–1.10)
GFR calc Af Amer: 85 mL/min — ABNORMAL LOW (ref >90)
GFR calc non Af Amer: 73 mL/min — ABNORMAL LOW (ref >90)
Sodium: 139 mEq/L (ref 135–145)

## 2013-07-02 LAB — URINE CULTURE: Culture: NO GROWTH

## 2013-07-02 LAB — CBC
HCT: 28 % — ABNORMAL LOW (ref 36.0–46.0)
MCH: 30.8 pg (ref 26.0–34.0)
MCHC: 34.6 g/dL (ref 30.0–36.0)
MCV: 88.9 fL (ref 78.0–100.0)
Platelets: 167 10*3/uL (ref 150–400)
RBC: 3.15 MIL/uL — ABNORMAL LOW (ref 3.87–5.11)
RDW: 13.8 % (ref 11.5–15.5)

## 2013-07-02 LAB — MAGNESIUM: Magnesium: 2.1 mg/dL (ref 1.5–2.5)

## 2013-07-02 SURGERY — IRRIGATION AND DEBRIDEMENT EXTREMITY
Anesthesia: Monitor Anesthesia Care | Site: Foot | Laterality: Right | Wound class: Dirty or Infected

## 2013-07-02 MED ORDER — LINEZOLID 2 MG/ML IV SOLN
600.0000 mg | Freq: Two times a day (BID) | INTRAVENOUS | Status: DC
  Administered 2013-07-02 – 2013-07-04 (×5): 600 mg via INTRAVENOUS
  Filled 2013-07-02 (×8): qty 300

## 2013-07-02 MED ORDER — PROPOFOL INFUSION 10 MG/ML OPTIME
INTRAVENOUS | Status: DC | PRN
  Administered 2013-07-02: 100 ug/kg/min via INTRAVENOUS

## 2013-07-02 MED ORDER — ONDANSETRON HCL 4 MG/2ML IJ SOLN: 4.0000 mg | Freq: Four times a day (QID) | INTRAMUSCULAR | Status: DC | PRN

## 2013-07-02 MED ORDER — MIDAZOLAM HCL 2 MG/2ML IJ SOLN: 1.0000 mg | INTRAMUSCULAR | Status: DC | PRN

## 2013-07-02 MED ORDER — SODIUM CHLORIDE 0.9 % IR SOLN
Status: DC | PRN
  Administered 2013-07-02: 1000 mL

## 2013-07-02 MED ORDER — FENTANYL CITRATE 0.05 MG/ML IJ SOLN: 25.0000 ug | INTRAMUSCULAR | Status: DC | PRN

## 2013-07-02 MED ORDER — FENTANYL CITRATE 0.05 MG/ML IJ SOLN
INTRAMUSCULAR | Status: DC | PRN
  Administered 2013-07-02: 50 ug via INTRAVENOUS

## 2013-07-02 MED ORDER — MIDAZOLAM HCL 5 MG/5ML IJ SOLN
INTRAMUSCULAR | Status: DC | PRN
  Administered 2013-07-02: 2 mg via INTRAVENOUS

## 2013-07-02 MED ORDER — DIPHENHYDRAMINE HCL 50 MG/ML IJ SOLN
INTRAMUSCULAR | Status: DC | PRN
  Administered 2013-07-02: 25 mg via INTRAVENOUS

## 2013-07-02 MED ORDER — ACETAMINOPHEN 325 MG PO TABS
650.0000 mg | ORAL_TABLET | ORAL | Status: DC | PRN
  Administered 2013-07-02 – 2013-07-03 (×3): 650 mg via ORAL
  Filled 2013-07-02 (×3): qty 2

## 2013-07-02 MED ORDER — SODIUM CHLORIDE 0.9 % IV SOLN
1.0000 g | Freq: Once | INTRAVENOUS | Status: AC
  Administered 2013-07-02: 1 g via INTRAVENOUS
  Filled 2013-07-02: qty 10

## 2013-07-02 MED ORDER — MIDAZOLAM HCL 2 MG/2ML IJ SOLN
INTRAMUSCULAR | Status: AC
  Administered 2013-07-02: 1 mg
  Filled 2013-07-02: qty 2

## 2013-07-02 MED ORDER — FENTANYL CITRATE 0.05 MG/ML IJ SOLN: 50.0000 ug | INTRAMUSCULAR | Status: DC | PRN

## 2013-07-02 MED ORDER — FENTANYL CITRATE 0.05 MG/ML IJ SOLN
INTRAMUSCULAR | Status: AC
  Filled 2013-07-02: qty 2

## 2013-07-02 MED ORDER — LACTATED RINGERS IV SOLN
INTRAVENOUS | Status: DC
  Administered 2013-07-02 – 2013-07-03 (×2): via INTRAVENOUS

## 2013-07-02 MED ORDER — BUPIVACAINE-EPINEPHRINE PF 0.5-1:200000 % IJ SOLN
INTRAMUSCULAR | Status: DC | PRN
  Administered 2013-07-02: 30 mL

## 2013-07-02 MED ORDER — DEXTROSE 5 % IV SOLN
2.0000 g | INTRAVENOUS | Status: DC
  Administered 2013-07-02 – 2013-07-03 (×2): 2 g via INTRAVENOUS
  Filled 2013-07-02 (×3): qty 2

## 2013-07-02 MED ORDER — LACTATED RINGERS IV SOLN
INTRAVENOUS | Status: DC | PRN
  Administered 2013-07-02: 13:00:00 via INTRAVENOUS

## 2013-07-02 SURGICAL SUPPLY — 53 items
BANDAGE ELASTIC 4 VELCRO ST LF (GAUZE/BANDAGES/DRESSINGS) ×2 IMPLANT
BANDAGE ELASTIC 6 VELCRO ST LF (GAUZE/BANDAGES/DRESSINGS) ×1 IMPLANT
BANDAGE GAUZE ELAST BULKY 4 IN (GAUZE/BANDAGES/DRESSINGS) ×1 IMPLANT
BLADE SURG 10 STRL SS (BLADE) ×2 IMPLANT
BNDG COHESIVE 4X5 TAN STRL (GAUZE/BANDAGES/DRESSINGS) ×1 IMPLANT
BOOTCOVER CLEANROOM LRG (PROTECTIVE WEAR) ×2 IMPLANT
CLOTH BEACON ORANGE TIMEOUT ST (SAFETY) ×1 IMPLANT
COVER SURGICAL LIGHT HANDLE (MISCELLANEOUS) ×2 IMPLANT
CUFF TOURNIQUET SINGLE 34IN LL (TOURNIQUET CUFF) IMPLANT
DRAPE U-SHAPE 47X51 STRL (DRAPES) ×1 IMPLANT
DURAPREP 26ML APPLICATOR (WOUND CARE) ×1 IMPLANT
ELECT REM PT RETURN 9FT ADLT (ELECTROSURGICAL)
ELECTRODE REM PT RTRN 9FT ADLT (ELECTROSURGICAL) IMPLANT
EVACUATOR 1/8 PVC DRAIN (DRAIN) IMPLANT
FACESHIELD LNG OPTICON STERILE (SAFETY) ×3 IMPLANT
GAUZE XEROFORM 1X8 LF (GAUZE/BANDAGES/DRESSINGS) ×1 IMPLANT
GLOVE BIO SURGEON STRL SZ 6.5 (GLOVE) ×1 IMPLANT
GLOVE BIO SURGEON STRL SZ8 (GLOVE) ×2 IMPLANT
GLOVE BIOGEL M 6.5 STRL (GLOVE) ×1 IMPLANT
GLOVE BIOGEL M 7.0 STRL (GLOVE) ×1 IMPLANT
GLOVE BIOGEL PI IND STRL 6.5 (GLOVE) IMPLANT
GLOVE BIOGEL PI IND STRL 7.0 (GLOVE) IMPLANT
GLOVE BIOGEL PI IND STRL 7.5 (GLOVE) IMPLANT
GLOVE BIOGEL PI INDICATOR 6.5 (GLOVE) ×1
GLOVE BIOGEL PI INDICATOR 7.0 (GLOVE) ×1
GLOVE BIOGEL PI INDICATOR 7.5 (GLOVE) ×1
GLOVE ECLIPSE 8.5 STRL (GLOVE) ×1 IMPLANT
GLOVE ORTHO TXT STRL SZ7.5 (GLOVE) ×3 IMPLANT
GOWN PREVENTION PLUS XXLARGE (GOWN DISPOSABLE) ×2 IMPLANT
GOWN STRL NON-REIN LRG LVL3 (GOWN DISPOSABLE) ×5 IMPLANT
HANDPIECE INTERPULSE COAX TIP (DISPOSABLE) ×2
KIT BASIN OR (CUSTOM PROCEDURE TRAY) ×2 IMPLANT
KIT ROOM TURNOVER OR (KITS) ×2 IMPLANT
MANIFOLD NEPTUNE II (INSTRUMENTS) ×1 IMPLANT
NS IRRIG 1000ML POUR BTL (IV SOLUTION) ×2 IMPLANT
PACK ORTHO EXTREMITY (CUSTOM PROCEDURE TRAY) ×2 IMPLANT
PAD ARMBOARD 7.5X6 YLW CONV (MISCELLANEOUS) ×4 IMPLANT
PAD CAST 4YDX4 CTTN HI CHSV (CAST SUPPLIES) IMPLANT
PADDING CAST COTTON 4X4 STRL (CAST SUPPLIES) ×2
PENCIL BUTTON HOLSTER BLD 10FT (ELECTRODE) IMPLANT
SET HNDPC FAN SPRY TIP SCT (DISPOSABLE) IMPLANT
SPONGE GAUZE 4X4 12PLY (GAUZE/BANDAGES/DRESSINGS) ×2 IMPLANT
SPONGE LAP 18X18 X RAY DECT (DISPOSABLE) ×2 IMPLANT
STOCKINETTE IMPERVIOUS 9X36 MD (GAUZE/BANDAGES/DRESSINGS) ×2 IMPLANT
SUT ETHILON 2 0 FS 18 (SUTURE) ×1 IMPLANT
SUT ETHILON 3 0 PS 1 (SUTURE) IMPLANT
TOWEL OR 17X24 6PK STRL BLUE (TOWEL DISPOSABLE) ×2 IMPLANT
TOWEL OR 17X26 10 PK STRL BLUE (TOWEL DISPOSABLE) ×2 IMPLANT
TUBE ANAEROBIC SPECIMEN COL (MISCELLANEOUS) IMPLANT
TUBE CONNECTING 12X1/4 (SUCTIONS) ×2 IMPLANT
UNDERPAD 30X30 INCONTINENT (UNDERPADS AND DIAPERS) ×2 IMPLANT
WATER STERILE IRR 1000ML POUR (IV SOLUTION) ×2 IMPLANT
YANKAUER SUCT BULB TIP NO VENT (SUCTIONS) ×2 IMPLANT

## 2013-07-02 NOTE — Addendum Note (Signed)
Addendum created 07/02/13 1610 by Andree Elk, CRNA   Modules edited: Anesthesia Events

## 2013-07-02 NOTE — Anesthesia Postprocedure Evaluation (Signed)
  Anesthesia Post-op Note  Patient: Shelly [name]  Procedure(s) Performed: Procedure(s): IRRIGATION AND DEBRIDEMENT RIGHT 1ST Metaphalangeal JOINT (Right)  Patient Location: PACU  Anesthesia Type:MAC and Regional  Level of Consciousness: awake and alert   Airway and Oxygen Therapy: Patient Spontanous Breathing  Post-op Pain: none  Post-op Assessment: Post-op Vital signs reviewed, Patient's Cardiovascular Status Stable, Respiratory Function Stable, Patent Airway, No signs of Nausea or vomiting and Pain level controlled  Post-op Vital Signs: Reviewed and stable  Complications: No apparent anesthesia complications

## 2013-07-02 NOTE — Progress Notes (Signed)
CRITICAL VALUE ALERT  Critical value received: calcium 6.4 Date of notification:  07/02/13  Time of notification:  0454  Critical value read back: yes  Nurse who received alert:  Ashley Jacobs  MD notified (1st page):  Ccm resident  Time of first page:  [phone]  Responding MD:  Ccm resident  Time MD responded:  [phone]

## 2013-07-02 NOTE — Preoperative (Signed)
Beta Blockers   Reason not to administer Beta Blockers:Not Applicable 

## 2013-07-02 NOTE — Anesthesia Procedure Notes (Addendum)
Anesthesia Regional Block:  Popliteal block  Pre-Anesthetic Checklist: ,, timeout performed, Correct Patient, Correct Site, Correct Laterality, Correct Procedure, Correct Position, site marked, Risks and benefits discussed,  Surgical consent,  Pre-op evaluation,  At surgeon's request and post-op pain management  Laterality: Right  Prep: chloraprep       Needles:  Injection technique: Single-shot  Needle Type: Echogenic Stimulator Needle      Needle Gauge: 21 and 21 G    Additional Needles:  Procedures: ultrasound guided (picture in chart) and nerve stimulator Popliteal block  Nerve Stimulator or Paresthesia:  Response: plantar flexion of foot, 0.45 mA,   Additional Responses:   Narrative:  Start time: 07/02/2013 1:04 PM End time: 07/02/2013 1:17 PM Injection made incrementally with aspirations every 5 mL.  Performed by: Personally  Anesthesiologist: Dr Chaney Malling  Additional Notes: Functioning IV was confirmed and monitors were applied.  A 90mm 21ga Arrow echogenic stimulator needle was used. Sterile prep and drape,hand hygiene and sterile gloves were used.  Negative aspiration and negative test dose prior to incremental administration of local anesthetic. The patient tolerated the procedure well.  Ultrasound guidance: relevent anatomy identified, needle position confirmed, local anesthetic spread visualized around nerve(s), vascular puncture avoided.  Image printed for medical record.   Popliteal block Procedure Name: MAC Date/Time: 07/02/2013 2:02 PM Performed by: Carmela Rima Pre-anesthesia Checklist: Emergency Drugs available, Patient identified, Timeout performed, Patient being monitored and Suction available Oxygen Delivery Method: Simple face mask Placement Confirmation: positive ETCO2

## 2013-07-02 NOTE — Anesthesia Preprocedure Evaluation (Addendum)
Anesthesia Evaluation  Patient identified by MRN, date of birth, ID band Patient awake    Reviewed: Allergy & Precautions, H&P , NPO status , Patient's Chart, lab work & pertinent test results  Airway Mallampati: II  Neck ROM: full    Dental  (+) Teeth Intact and Dental Advidsory Given   Pulmonary former smoker,          Cardiovascular  Pt admitted yesterday with sepsis. +Tachycardia. +hypotension.   Neuro/Psych  Headaches,  Neuromuscular disease    GI/Hepatic GERD-  ,  Endo/Other    Renal/GU      Musculoskeletal   Abdominal   Peds  Hematology   Anesthesia Other Findings   Reproductive/Obstetrics                          Anesthesia Physical Anesthesia Plan  ASA: III  Anesthesia Plan: MAC and Regional   Post-op Pain Management:    Induction: Intravenous  Airway Management Planned: Simple Face Mask  Additional Equipment:   Intra-op Plan:   Post-operative Plan:   Informed Consent: I have reviewed the patients History and Physical, chart, labs and discussed the procedure including the risks, benefits and alternatives for the proposed anesthesia with the patient or authorized representative who has indicated his/her understanding and acceptance.   Dental Advisory Given  Plan Discussed with: CRNA, Anesthesiologist and Surgeon  Anesthesia Plan Comments:        Anesthesia Quick Evaluation

## 2013-07-02 NOTE — Transfer of Care (Signed)
Immediate Anesthesia Transfer of Care Note  Patient: Shelly [name]  Procedure(s) Performed: Procedure(s): IRRIGATION AND DEBRIDEMENT RIGHT 1ST Metaphalangeal JOINT (Right)  Patient Location: ICU  Anesthesia Type:MAC combined with regional for post-op pain  Level of Consciousness: awake, alert , oriented and patient cooperative  Airway & Oxygen Therapy: Patient Spontanous Breathing and Patient connected to nasal cannula oxygen  Post-op Assessment: Report given to PACU RN, Post -op Vital signs reviewed and stable and Patient moving all extremities  Post vital signs: Reviewed and stable  Complications: No apparent anesthesia complications

## 2013-07-02 NOTE — H&P (View-Only) (Signed)
Marijose is a healthy 42 yo female well known to me. S/P Left foot cheilectomy 2 months ago doing well. S/P cheilectomy Right foot first MTP joint one month age initially doing well. Seen in our office 11/17 with mild wound dehisence and NO evidence of infection locally or systemic. Admitted now less than 24 hrs later with systemic infection and dramatic localized abcess on Right. Cannot explain this explosive progression in otherwise healthy person. Seen and examined by me today. Appreciate Medical and ID help. I plan I&D of toe in main OR tomorrow. Already draining wound allowing some decompression. Await cultures.  Will follow with current management team. Dr Mckinley Jewel

## 2013-07-02 NOTE — Progress Notes (Signed)
PULMONARY  / CRITICAL CARE MEDICINE  Name: Shelly [name] MRN: [id number] DOB: November 04, 1970    ADMISSION DATE:  07/01/2013 CONSULTATION DATE:  07/01/2013  REFERRING MD :  Rubin Payor (ED) PRIMARY SERVICE: PCCM  CHIEF COMPLAINT:  Hypotension/Sepsis  BRIEF PATIENT DESCRIPTION: 42 y.o. F who underwent right great toe chilectomy 1 month ago (Dr. Eulah Pont).  Had stitches removed post op week 2 - 3, then wound began to dehisce.  On 11/17, pt began to have fever/chills and generalized weakness/not feeling well.  Came to ER 11/18, PCCM consulted for Sepsis/hypotension.  SIGNIFICANT EVENTS / STUDIES:  10/20 Right great toe chilectomy 11/17 Began to experience fever/chills/weakness. 11/18 Presented to ER, found to be hypotensive and tachycardic.  XRay of R great toe showed no acute fracture or evidence of osteo.  MRI of R great toe showed no evidence of osteo/tenosynovitis/septic joint.  Did show dorsal soft tissue abscess, 6cm diameter.  Levo started.  LINES / TUBES: Right IJ TLC 11/18 >>>  CULTURES: Blood 11/18 >>> Urine 11/18 >>> Wound 11/18 >>>  ANTIBIOTICS: Vanc 11/18 >>> Zosyn 11/18 >>>  SUBJECTIVE:  Episode of chest tightness overnight.  Repeat labs/EKG/CXR ordered.  11.7L IVF given over last 24 hrs.   VITAL SIGNS: Temp:  [99.9 F (37.7 C)-103.5 F (39.7 C)] 100.2 F (37.9 C) (11/19 0748) Pulse Rate:  [96-136] 104 (11/19 0630) Resp:  [5-39] 20 (11/19 0630) BP: (56-116)/(30-85) 90/51 mmHg (11/19 0630) SpO2:  [91 %-100 %] 100 % (11/19 0630) Weight:  [159 lb 2.8 oz (72.2 kg)-173 lb 11.6 oz (78.8 kg)] 173 lb 11.6 oz (78.8 kg) (11/19 0500) HEMODYNAMICS: CVP:  [5 mmHg-11 mmHg] 10 mmHg  VENTILATOR SETTINGS:   INTAKE / OUTPUT: Intake/Output     11/18 0701 - 11/19 0700 11/19 0701 - 11/20 0700   I.V. (mL/kg) 9614.3 (122)    IV Piggyback 2150    Total Intake(mL/kg) 11764.3 (149.3)    Urine (mL/kg/hr) 1900 (1)    Total Output 1900     Net +9864.3          Stool Occurrence 1 x       PHYSICAL EXAMINATION: General:  Pleasant female, lying in bed, ill appearing. Neuro:  A&O x 3.  Mood and affect appropriate. HEENT:  [place]/AT.  PERRL.  MMM. Cardiovascular:  Tachycardic, no M/R/G. Lungs:  Resps even and unlabored.  CTA bilaterally, no W/R/R. Abdomen:  BS x 4.  Soft, NT/ND. Musculoskeletal:  Dressing of right great toe in place, C/D/I.  No edema. Skin:  Very warm to touch and dry.  LABS:  CBC  Recent Labs Lab 07/01/13 1300 07/01/13 1734 07/02/13 0415  WBC 16.8* 23.7* 24.2*  HGB 9.7* 10.4* 9.7*  HCT 28.8* 29.3* 28.0*  PLT 179 191 167   BMET  Recent Labs Lab 07/01/13 0549 07/01/13 1300 07/01/13 1734 07/02/13 0415  NA 139  --  138 139  K 4.3  --  3.9 3.7  CL 102  --  107 109  CO2  --   --  18* 19  BUN 18  --  16 16  CREATININE 1.00 1.03 1.06 0.95  GLUCOSE 120*  --  129* 132*   Sepsis Markers  Recent Labs Lab 07/01/13 1020 07/01/13 1300 07/01/13 1734  LATICACIDVEN 1.43  --  2.8*  PROCALCITON  --  12.21  --    Imaging Mr Foot Right W Wo Contrast  07/01/2013   CLINICAL DATA:  Large nonhealing wound on the dorsal aspect of the foot.  EXAM: MRI  OF THE RIGHT FOREFOOT WITHOUT AND WITH CONTRAST  TECHNIQUE: Multiplanar, multisequence MR imaging was performed both before and after administration of intravenous contrast.  CONTRAST:  14mL MULTIHANCE GADOBENATE DIMEGLUMINE 529 MG/ML IV SOLN  COMPARISON:  Radiographs dated 07/01/2013  FINDINGS: Soft tissue wound is seen in the subcutaneous fat of the dorsum of the foot overlying the head of the 1st metatarsal. There is no significant joint effusion. Minimal edema in the head of the 1st metatarsal is consistent with recent surgery. The finding is not suggestive of osteomyelitis.  There is an abscess which extends laterally across the dorsum of the foot in the subcutaneous fat. There is extensive nonenhancing fluid in the dorsum of the foot which could represent edema or pus. This is contiguous with the abscess  which extends medially from the soft tissue wound. This collection of fluid extends over a 6 cm diameter area on the dorsum of the foot.  The other visualized bones of the forefoot are normal. Muscles and tendons appear normal.  After contrast administration there is enhancement of the soft tissues around the dorsal aspect of the 1st metatarsal phalangeal joint consistent with cellulitis.  IMPRESSION: 1. No evidence of osteomyelitis or tenosynovitis. No evidence of septic 1st metatarsal phalangeal joint. 2. Dorsal soft tissue abscess extending laterally across the dorsum of the foot from the soft tissue wound. The abnormal fluid collection extends in a 6 cm diameter area. Some of this could be pus and some could be reactive edema but I cannot differentiate between the two.   Electronically Signed   By: Geanie Cooley M.D.   On: 07/01/2013 09:55   Dg Chest Port 1 View  07/01/2013   CLINICAL DATA:  Chest tightness  EXAM: PORTABLE CHEST - 1 VIEW  COMPARISON:  07/01/2013, 11/15/2009  FINDINGS: Cervical fusion hardware partly visualized. Right IJ central line tip terminates over the distal SVC. Heart size upper limits of normal. Nipple shadow reidentified over the right lung base. No new pulmonary opacity or pleural effusion.  IMPRESSION: No new acute finding.   Electronically Signed   By: Christiana Pellant M.D.   On: 07/01/2013 18:29   Dg Chest Port 1 View  07/01/2013   CLINICAL DATA:  42 year old female with central line placement and fever. Initial encounter.  EXAM: PORTABLE CHEST - 1 VIEW  COMPARISON:  11/15/2009.  FINDINGS: Portable AP semi upright view at at 1316 hrs. Right IJ approach central line. Catheter tip at the lower SVC level between the carinal a and cavoatrial junction. Mildly lower lung volumes. Normal cardiac size and mediastinal contours. Visualized tracheal air column is within normal limits. Cervical ACDF hardware partially visible. Allowing for portable technique, the lungs are clear. No  pneumothorax.  IMPRESSION: Right IJ central line placed, tip at the lower SVC level. No pneumothorax or acute cardiopulmonary abnormality.   Electronically Signed   By: Augusto Gamble M.D.   On: 07/01/2013 13:34   Dg Foot Complete Right  07/01/2013   CLINICAL DATA:  Swelling under postoperative 2nd metatarsal site. Evaluate for osteomyelitis.  EXAM: RIGHT FOOT COMPLETE - 3+ VIEW  COMPARISON:  None available for comparison at time of study interpretation.  FINDINGS: No acute fracture deformity or dislocation. Mild 1st metatarsophalangeal joint space narrowing with periarticular sclerosis and marginal spurring consistent with osteoarthrosis. Joint space intact without erosions. No destructive bony lesions. Soft tissue planes are not suspicious.  IMPRESSION: No acute fracture deformity or dislocation. No radiographic findings of osteomyelitis though, if suspicion persists, MRI would  be more sensitive.  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   Electronically Signed   By: Awilda Metro   On: 07/01/2013 06:42     CXR: 11/18 - no acute findings. Right IJ CVC in place.  ASSESSMENT / PLAN:  INFECTIOUS A:   Sepsis/Septic Shock - presumed secondary to right great toe infection s/p surgery 1 month ago. Abscess of right great toe. P:   - Cont empiric abx with Vanc/Zosyn. - IV Tylenol PRN. - Monitor WBCs/fever curve.. - f/u cultures. - To OR today per ortho for I&D of toe abscess.  CARDIOVASCULAR A:  Septic Shock Chest Pain -  Troponin negative x 2. P:  - See above (ID section). - IVF resuscitation with NS @ 100. - Goal MAP >= 65. - Goal UOP > 0.74ml/kg/hr - Cont Levophed and titrate to goal MAP. - EKG not done last night, will check now.  PULMONARY A: No acute issue. P:   - Monitor respiratory status. - CXR in AM post surgery. - IS starting in AM.  RENAL A:   Hypocalcemia - Corrected Calcium = 7.4. P:   - Replete Calcium. - Monitor BMP.  GASTROINTESTINAL A:  No acute issue. P:   - NPO  for now. - SUP: pantoprazole.  HEMATOLOGIC A:  No acute issue. P:  - DVT Px: Heparin SQ.  ENDOCRINE A: No acute issue.  Random Cortisol normal (34.1) - no role for stress steroids. P:   - Monitor CBG's.  NEUROLOGIC A:  No acute issue. P:   - Monitor.  Rutherford Guys, [place] -S  35 minutes CC time time   Levy Pupa, MD, PhD 07/02/2013, 12:41 PM [facility] [phone] or if no answer [phone]

## 2013-07-02 NOTE — Consult Note (Signed)
[facility]    Date of Admission:  07/01/2013  Date of Consult:  07/02/2013  Reason for Consult: GAS infection with toxic shock/septic shock Referring Physician: Dr. Eulah Pont   HPI: Shelly [name] is an 42 y.o. female.who underwent right great toe chilectomy 1 month ago (Dr. Eulah Pont). Had stitches removed post op week 2 - 3, then wound began to dehisce. On 11/17, pt began to have fever/chills and generalized weakness/not feeling well. Came to ER 11/18 fevers, septic shock. Blood cultures, wound cultures taken she was started on vancomycin and Zosyn.  MRI of the foot was performed which showed:   IMPRESSION:  1. No evidence of osteomyelitis or tenosynovitis. No evidence of  septic 1st metatarsal phalangeal joint.  2. Dorsal soft tissue abscess extending laterally across the dorsum  of the foot from the soft tissue wound. The abnormal fluid  collection extends in a 6 cm diameter area. Some of this could be  pus and some could be reactive edema but I cannot differentiate  between the two.  Dr. Eulah Pont to the operating room today and performed incision and debridement of her foot with closure of sutures. At the end of surgery she began developing bullous lesions in a linear fashion from the wound extending up her right foot into her ankle.  Wound culture is growing group A strep.  Patient was transferred back to the intensive care unit and Dr. Eulah Pont consult me urgently for concern for for group A strep mediated toxic shock , versus like less likely group A strep aggressive necrotizing infection.  The patient has been having dry lips and desquamation of skin on her tongue and a buccal mucosa probably consistent with toxic shock from group A strep.  Of interest her son was also recently diagnosed with a soft tissue infection which was labeled as a staph infection but which I suspect was more likely also a group A strep infection.  Patient herself has not been  menstruating recently and other than the dressing of the wound has had no other packing material in place.   Past Medical History  Diagnosis Date  . GERD (gastroesophageal reflux disease)   . Medical history non-contributory     Past Surgical History  Procedure Laterality Date  . Cesarean section  2008    complete placenta pervia, hemmorage  . Knee surgery  [phone]    7 surgeries left knee(2 lateral releases, 1 tibial tubercle oxtomy, 1 patellectomy)  . Cervical fusion  2011  . Cheilectomy Left 05/01/2013    Procedure: LEFT GREAT TOE HALLUX RIGIDUS WITH CHEILECTOMY 1ST METATARSOPHALANGEAL;  Surgeon: Loreta Ave, MD;  Location: [facility];  Service: Orthopedics;  Laterality: Left;  . Carpal tunnel release Left   . Cheilectomy Right 06/02/2013    procedule:right great toe chielectomy  ergies:   Allergies  Allergen Reactions  . Dilaudid [Hydromorphone Hcl]   . Codeine     REACTION: Hives     Medications: I have reviewed patients current medications as documented in Epic Anti-infectives   Start     Dose/Rate Route Frequency Ordered Stop   07/02/13 2200  linezolid (ZYVOX) IVPB 600 mg     600 mg 300 mL/hr over 60 Minutes Intravenous Every 12 hours 07/02/13 1602     07/02/13 1615  cefTRIAXone (ROCEPHIN) 2 g in dextrose 5 % 50 mL IVPB     2 g 100 mL/hr over 30 Minutes Intravenous Every 24 hours 07/02/13 1602  07/01/13 2200  [MAR Hold]  piperacillin-tazobactam (ZOSYN) IVPB 3.375 g  Status:  Discontinued     (On MAR Hold since 07/02/13 1304)   3.375 g 12.5 mL/hr over 240 Minutes Intravenous 3 times per day 07/01/13 1147 07/02/13 1602   07/01/13 1400  [MAR Hold]  vancomycin (VANCOCIN) IVPB 750 mg/150 ml premix  Status:  Discontinued     (On MAR Hold since 07/02/13 1304)   750 mg 150 mL/hr over 60 Minutes Intravenous Every 8 hours 07/01/13 1147 07/02/13 1602   07/01/13 1130  piperacillin-tazobactam (ZOSYN) IVPB 3.375 g     3.375 g 100 mL/hr over 30 Minutes  Intravenous STAT 07/01/13 1118 07/01/13 1217   07/01/13 0545  vancomycin (VANCOCIN) IVPB 1000 mg/200 mL premix     1,000 mg 200 mL/hr over 60 Minutes Intravenous  Once 07/01/13 0542 07/01/13 4540      Social History:  reports that she quit smoking about 18 years ago. She does not have any smokeless tobacco history on file. She reports that she does not drink alcohol or use illicit drugs.  Family History  Problem Relation Age of Onset  . Hypertension Mother   . Hyperlipidemia Mother   . Hypertension Father   . Alzheimer's disease Father   . Migraines Sister   . Kidney disease Maternal Grandmother   . Heart attack Maternal Grandfather 63    As in HPI and primary teams notes otherwise 12 point review of systems is negative  Blood pressure 109/60, pulse 112, temperature 98.7 F (37.1 C), temperature source Oral, resp. rate 23, height 5\' 6"  (1.676 m), weight 173 lb 11.6 oz (78.8 kg), last menstrual period 04/23/2013, SpO2 94.00%. General: Alert and awake, oriented x3, flushed HEENT: anicteric sclera, pupils reactive to light and accommodation, EOMI, oropharynx with exudate from skin that has peeled off tongue and buccal mucosa      CVStachycardic rate, normal r,  no murmur rubs or gallops Chest: clear to auscultation bilaterally, no wheezing, rales or rhonchi Abdomen: soft nontender, nondistended, normal bowel sounds, Extremities: no  clubbing or edema noted bilaterally Lymph: few scattered pelvic nodes  EXt: right foot with sutures in place, see picture, bullous lesions going up foot to ankle see pictures       Neuro: nonfocal, strength and sensation intact   Results for orders placed during the hospital encounter of 07/01/13 (from the past 48 hour(s))  CBC WITH DIFFERENTIAL     Status: Abnormal   Collection Time    07/01/13  5:40 AM      Result Value Range   WBC 11.0 (*) 4.0 - 10.5 K/uL   RBC 4.12  3.87 - 5.11 MIL/uL   Hemoglobin 12.4  12.0 - 15.0 g/dL   HCT 98.1   19.1 - 47.8 %   MCV 88.8  78.0 - 100.0 fL   MCH 30.1  26.0 - 34.0 pg   MCHC 33.9  30.0 - 36.0 g/dL   RDW 29.5  62.1 - 30.8 %   Platelets 228  150 - 400 K/uL   Neutrophils Relative % 96 (*) 43 - 77 %   Neutro Abs 10.5 (*) 1.7 - 7.7 K/uL   Lymphocytes Relative 3 (*) 12 - 46 %   Lymphs Abs 0.3 (*) 0.7 - 4.0 K/uL   Monocytes Relative 1 (*) 3 - 12 %   Monocytes Absolute 0.1  0.1 - 1.0 K/uL   Eosinophils Relative 0  0 - 5 %   Eosinophils Absolute 0.0  0.0 -  0.7 K/uL   Basophils Relative 0  0 - 1 %   Basophils Absolute 0.0  0.0 - 0.1 K/uL  POCT I-STAT, CHEM 8     Status: Abnormal   Collection Time    07/01/13  5:49 AM      Result Value Range   Sodium 139  135 - 145 mEq/L   Potassium 4.3  3.5 - 5.1 mEq/L   Chloride 102  96 - 112 mEq/L   BUN 18  6 - 23 mg/dL   Creatinine, Ser 1.61  0.50 - 1.10 mg/dL   Glucose, Bld 096 (*) 70 - 99 mg/dL   Calcium, Ion 0.45  1.12 - 1.23 mmol/L   TCO2 23  0 - 100 mmol/L   Hemoglobin 12.6  12.0 - 15.0 g/dL   HCT 40.9  81.1 - 91.4 %  WOUND CULTURE     Status: None   Collection Time    07/01/13  5:59 AM      Result Value Range   Specimen Description WOUND RIGHT FOOT     Special Requests RIGHT FOOT GREAT TOE     Gram Stain       Value: MODERATE WBC PRESENT, PREDOMINANTLY PMN     NO SQUAMOUS EPITHELIAL CELLS SEEN     ABUNDANT GRAM POSITIVE COCCI IN PAIRS     Performed at [facility]   Culture       Value: ABUNDANT GROUP A STREP (S.PYOGENES) ISOLATED     Performed at [facility]   Report Status PENDING    CULTURE, BLOOD (ROUTINE X 2)     Status: None   Collection Time    07/01/13 10:00 AM      Result Value Range   Specimen Description BLOOD LEFT ANTECUBITAL     Special Requests BOTTLES DRAWN AEROBIC ONLY 10CC     Culture  Setup Time       Value: 07/01/2013 15:03     Performed at [facility]   Culture       Value:        BLOOD CULTURE RECEIVED NO GROWTH TO DATE CULTURE WILL BE HELD FOR 5 DAYS BEFORE ISSUING A FINAL  NEGATIVE REPORT     Performed at [facility]   Report Status PENDING    SEDIMENTATION RATE     Status: None   Collection Time    07/01/13 10:05 AM      Result Value Range   Sed Rate 6  0 - 22 mm/hr  C-REACTIVE PROTEIN     Status: Abnormal   Collection Time    07/01/13 10:05 AM      Result Value Range   CRP 3.9 (*) <0.60 mg/dL   Comment: Performed at [facility]  CULTURE, BLOOD (ROUTINE X 2)     Status: None   Collection Time    07/01/13 10:10 AM      Result Value Range   Specimen Description BLOOD ARM LEFT     Special Requests BOTTLES DRAWN AEROBIC AND ANAEROBIC 10CC     Culture  Setup Time       Value: 07/01/2013 15:04     Performed at [facility]   Culture       Value:        BLOOD CULTURE RECEIVED NO GROWTH TO DATE CULTURE WILL BE HELD FOR 5 DAYS BEFORE ISSUING A FINAL NEGATIVE REPORT     Performed at [facility]   Report Status  PENDING    CG4 I-STAT (LACTIC ACID)     Status: None   Collection Time    07/01/13 10:20 AM      Result Value Range   Lactic Acid, Venous 1.43  0.5 - 2.2 mmol/L  URINE CULTURE     Status: None   Collection Time    07/01/13 12:13 PM      Result Value Range   Specimen Description URINE, CLEAN CATCH     Special Requests NONE     Culture  Setup Time       Value: 07/01/2013 18:10     Performed at [facility] Count       Value: NO GROWTH     Performed at [facility]   Culture       Value: NO GROWTH     Performed at [facility]   Report Status 07/02/2013 FINAL    GLUCOSE, CAPILLARY     Status: Abnormal   Collection Time    07/01/13 12:13 PM      Result Value Range   Glucose-Capillary 104 (*) 70 - 99 mg/dL  MRSA PCR SCREENING     Status: None   Collection Time    07/01/13 12:14 PM      Result Value Range   MRSA by PCR NEGATIVE  NEGATIVE   Comment:            The GeneXpert MRSA Assay (FDA     approved for NASAL specimens     only), is one component of a      comprehensive MRSA colonization     surveillance program. It is not     intended to diagnose MRSA     infection nor to guide or     monitor treatment for     MRSA infections.  CBC     Status: Abnormal   Collection Time    07/01/13  1:00 PM      Result Value Range   WBC 16.8 (*) 4.0 - 10.5 K/uL   RBC 3.27 (*) 3.87 - 5.11 MIL/uL   Hemoglobin 9.7 (*) 12.0 - 15.0 g/dL   Comment: REPEATED TO VERIFY   HCT 28.8 (*) 36.0 - 46.0 %   MCV 88.1  78.0 - 100.0 fL   MCH 29.7  26.0 - 34.0 pg   MCHC 33.7  30.0 - 36.0 g/dL   RDW 16.1  09.6 - 04.5 %   Platelets 179  150 - 400 K/uL  CREATININE, SERUM     Status: Abnormal   Collection Time    07/01/13  1:00 PM      Result Value Range   Creatinine, Ser 1.03  0.50 - 1.10 mg/dL   GFR calc non Af Amer 67 (*) >90 mL/min   GFR calc Af Amer 77 (*) >90 mL/min   Comment: (NOTE)     The eGFR has been calculated using the CKD EPI equation.     This calculation has not been validated in all clinical situations.     eGFR's persistently <90 mL/min signify possible Chronic Kidney     Disease.  CORTISOL     Status: None   Collection Time    07/01/13  1:00 PM      Result Value Range   Cortisol, Plasma 34.1     Comment: (NOTE)     AM:  4.3 - 22.4 ug/dL     PM:  3.1 - 40.9 ug/dL  Performed at [facility]  PROCALCITONIN     Status: None   Collection Time    07/01/13  1:00 PM      Result Value Range   Procalcitonin 12.21     Comment:            Interpretation:     PCT >= 10 ng/mL:     Important systemic inflammatory response,     almost exclusively due to severe bacterial     sepsis or septic shock.     (NOTE)             ICU PCT Algorithm               Non ICU PCT Algorithm        ----------------------------     ------------------------------             PCT < 0.25 ng/mL                 PCT < 0.1 ng/mL         Stopping of antibiotics            Stopping of antibiotics           strongly encouraged.               strongly encouraged.         ----------------------------     ------------------------------           PCT level decrease by               PCT < 0.25 ng/mL           >= 80% from peak PCT           OR PCT 0.25 - 0.5 ng/mL          Stopping of antibiotics                                                 encouraged.         Stopping of antibiotics               encouraged.        ----------------------------     ------------------------------           PCT level decrease by              PCT >= 0.25 ng/mL           < 80% from peak PCT            AND PCT >= 0.5 ng/mL            Continuing antibiotics                                                  encouraged.           Continuing antibiotics                encouraged.        ----------------------------     ------------------------------         PCT level increase compared          PCT > 0.5 ng/mL  with peak PCT AND              PCT >= 0.5 ng/mL             Escalation of antibiotics                                              strongly encouraged.          Escalation of antibiotics            strongly encouraged.  CBC WITH DIFFERENTIAL     Status: Abnormal   Collection Time    07/01/13  5:34 PM      Result Value Range   WBC 23.7 (*) 4.0 - 10.5 K/uL   RBC 3.35 (*) 3.87 - 5.11 MIL/uL   Hemoglobin 10.4 (*) 12.0 - 15.0 g/dL   HCT 16.1 (*) 09.6 - 04.5 %   MCV 87.5  78.0 - 100.0 fL   MCH 31.0  26.0 - 34.0 pg   MCHC 35.5  30.0 - 36.0 g/dL   RDW 40.9  81.1 - 91.4 %   Platelets 191  150 - 400 K/uL   Neutrophils Relative % 96 (*) 43 - 77 %   Lymphocytes Relative 1 (*) 12 - 46 %   Monocytes Relative 3  3 - 12 %   Eosinophils Relative 0  0 - 5 %   Basophils Relative 0  0 - 1 %   Neutro Abs 22.8 (*) 1.7 - 7.7 K/uL   Lymphs Abs 0.2 (*) 0.7 - 4.0 K/uL   Monocytes Absolute 0.7  0.1 - 1.0 K/uL   Eosinophils Absolute 0.0  0.0 - 0.7 K/uL   Basophils Absolute 0.0  0.0 - 0.1 K/uL   WBC Morphology INCREASED BANDS (>20% BANDS)     Comment: TOXIC GRANULATION      VACUOLATED NEUTROPHILS   Smear Review LARGE PLATELETS PRESENT    BASIC METABOLIC PANEL     Status: Abnormal   Collection Time    07/01/13  5:34 PM      Result Value Range   Sodium 138  135 - 145 mEq/L   Potassium 3.9  3.5 - 5.1 mEq/L   Chloride 107  96 - 112 mEq/L   CO2 18 (*) 19 - 32 mEq/L   Glucose, Bld 129 (*) 70 - 99 mg/dL   BUN 16  6 - 23 mg/dL   Creatinine, Ser 7.82  0.50 - 1.10 mg/dL   Calcium 6.8 (*) 8.4 - 10.5 mg/dL   GFR calc non Af Amer 64 (*) >90 mL/min   GFR calc Af Amer 75 (*) >90 mL/min   Comment: (NOTE)     The eGFR has been calculated using the CKD EPI equation.     This calculation has not been validated in all clinical situations.     eGFR's persistently <90 mL/min signify possible Chronic Kidney     Disease.  LACTIC ACID, PLASMA     Status: Abnormal   Collection Time    07/01/13  5:34 PM      Result Value Range   Lactic Acid, Venous 2.8 (*) 0.5 - 2.2 mmol/L  HEPATIC FUNCTION PANEL     Status: Abnormal   Collection Time    07/01/13  5:34 PM      Result Value Range   Total Protein 4.9 (*) 6.0 -  8.3 g/dL   Albumin 2.5 (*) 3.5 - 5.2 g/dL   AST 18  0 - 37 U/L   ALT 17  0 - 35 U/L   Alkaline Phosphatase 26 (*) 39 - 117 U/L   Total Bilirubin 0.4  0.3 - 1.2 mg/dL   Bilirubin, Direct 0.1  0.0 - 0.3 mg/dL   Indirect Bilirubin 0.3  0.3 - 0.9 mg/dL  MAGNESIUM     Status: Abnormal   Collection Time    07/01/13  5:34 PM      Result Value Range   Magnesium 1.0 (*) 1.5 - 2.5 mg/dL  PHOSPHORUS     Status: None   Collection Time    07/01/13  5:34 PM      Result Value Range   Phosphorus 3.2  2.3 - 4.6 mg/dL  TROPONIN I     Status: None   Collection Time    07/01/13  5:34 PM      Result Value Range   Troponin I <0.30  <0.30 ng/mL   Comment:            Due to the release kinetics of cTnI,     a negative result within the first hours     of the onset of symptoms does not rule out     myocardial infarction with certainty.     If myocardial infarction is still  suspected,     repeat the test at appropriate intervals.  GLUCOSE, CAPILLARY     Status: Abnormal   Collection Time    07/01/13  7:48 PM      Result Value Range   Glucose-Capillary 113 (*) 70 - 99 mg/dL  CBC     Status: Abnormal   Collection Time    07/02/13  4:15 AM      Result Value Range   WBC 24.2 (*) 4.0 - 10.5 K/uL   RBC 3.15 (*) 3.87 - 5.11 MIL/uL   Hemoglobin 9.7 (*) 12.0 - 15.0 g/dL   HCT 16.1 (*) 09.6 - 04.5 %   MCV 88.9  78.0 - 100.0 fL   MCH 30.8  26.0 - 34.0 pg   MCHC 34.6  30.0 - 36.0 g/dL   RDW 40.9  81.1 - 91.4 %   Platelets 167  150 - 400 K/uL  BASIC METABOLIC PANEL     Status: Abnormal   Collection Time    07/02/13  4:15 AM      Result Value Range   Sodium 139  135 - 145 mEq/L   Potassium 3.7  3.5 - 5.1 mEq/L   Chloride 109  96 - 112 mEq/L   CO2 19  19 - 32 mEq/L   Glucose, Bld 132 (*) 70 - 99 mg/dL   BUN 16  6 - 23 mg/dL   Creatinine, Ser 7.82  0.50 - 1.10 mg/dL   Calcium 6.4 (*) 8.4 - 10.5 mg/dL   Comment: CRITICAL RESULT CALLED TO, READ BACK BY AND VERIFIED WITH:     MOORE C,RN 07/02/13 0453 WAYK   GFR calc non Af Amer 73 (*) >90 mL/min   GFR calc Af Amer 85 (*) >90 mL/min   Comment: (NOTE)     The eGFR has been calculated using the CKD EPI equation.     This calculation has not been validated in all clinical situations.     eGFR's persistently <90 mL/min signify possible Chronic Kidney     Disease.  MAGNESIUM     Status: None  Collection Time    07/02/13  4:15 AM      Result Value Range   Magnesium 2.1  1.5 - 2.5 mg/dL  PHOSPHORUS     Status: None   Collection Time    07/02/13  4:15 AM      Result Value Range   Phosphorus 3.1  2.3 - 4.6 mg/dL  TROPONIN I     Status: None   Collection Time    07/02/13  4:15 AM      Result Value Range   Troponin I <0.30  <0.30 ng/mL   Comment:            Due to the release kinetics of cTnI,     a negative result within the first hours     of the onset of symptoms does not rule out     myocardial  infarction with certainty.     If myocardial infarction is still suspected,     repeat the test at appropriate intervals.  TROPONIN I     Status: None   Collection Time    07/02/13  9:45 AM      Result Value Range   Troponin I <0.30  <0.30 ng/mL   Comment:            Due to the release kinetics of cTnI,     a negative result within the first hours     of the onset of symptoms does not rule out     myocardial infarction with certainty.     If myocardial infarction is still suspected,     repeat the test at appropriate intervals.  GLUCOSE, CAPILLARY     Status: None   Collection Time    07/02/13 12:12 PM      Result Value Range   Glucose-Capillary 94  70 - 99 mg/dL      Component Value Date/Time   SDES URINE, CLEAN CATCH 07/01/2013 1213   SPECREQUEST NONE 07/01/2013 1213   CULT  Value: NO GROWTH Performed at [facility] 07/01/2013 1213   REPTSTATUS 07/02/2013 FINAL 07/01/2013 1213   Mr Foot Right W Wo Contrast  07/01/2013   CLINICAL DATA:  Large nonhealing wound on the dorsal aspect of the foot.  EXAM: MRI OF THE RIGHT FOREFOOT WITHOUT AND WITH CONTRAST  TECHNIQUE: Multiplanar, multisequence MR imaging was performed both before and after administration of intravenous contrast.  CONTRAST:  14mL MULTIHANCE GADOBENATE DIMEGLUMINE 529 MG/ML IV SOLN  COMPARISON:  Radiographs dated 07/01/2013  FINDINGS: Soft tissue wound is seen in the subcutaneous fat of the dorsum of the foot overlying the head of the 1st metatarsal. There is no significant joint effusion. Minimal edema in the head of the 1st metatarsal is consistent with recent surgery. The finding is not suggestive of osteomyelitis.  There is an abscess which extends laterally across the dorsum of the foot in the subcutaneous fat. There is extensive nonenhancing fluid in the dorsum of the foot which could represent edema or pus. This is contiguous with the abscess which extends medially from the soft tissue wound. This collection  of fluid extends over a 6 cm diameter area on the dorsum of the foot.  The other visualized bones of the forefoot are normal. Muscles and tendons appear normal.  After contrast administration there is enhancement of the soft tissues around the dorsal aspect of the 1st metatarsal phalangeal joint consistent with cellulitis.  IMPRESSION: 1. No evidence of osteomyelitis or tenosynovitis. No evidence of septic 1st  metatarsal phalangeal joint. 2. Dorsal soft tissue abscess extending laterally across the dorsum of the foot from the soft tissue wound. The abnormal fluid collection extends in a 6 cm diameter area. Some of this could be pus and some could be reactive edema but I cannot differentiate between the two.   Electronically Signed   By: Geanie Cooley M.D.   On: 07/01/2013 09:55   Dg Chest Port 1 View  07/01/2013   CLINICAL DATA:  Chest tightness  EXAM: PORTABLE CHEST - 1 VIEW  COMPARISON:  07/01/2013, 11/15/2009  FINDINGS: Cervical fusion hardware partly visualized. Right IJ central line tip terminates over the distal SVC. Heart size upper limits of normal. Nipple shadow reidentified over the right lung base. No new pulmonary opacity or pleural effusion.  IMPRESSION: No new acute finding.   Electronically Signed   By: Christiana Pellant M.D.   On: 07/01/2013 18:29   Dg Chest Port 1 View  07/01/2013   CLINICAL DATA:  42 year old female with central line placement and fever. Initial encounter.  EXAM: PORTABLE CHEST - 1 VIEW  COMPARISON:  11/15/2009.  FINDINGS: Portable AP semi upright view at at 1316 hrs. Right IJ approach central line. Catheter tip at the lower SVC level between the carinal a and cavoatrial junction. Mildly lower lung volumes. Normal cardiac size and mediastinal contours. Visualized tracheal air column is within normal limits. Cervical ACDF hardware partially visible. Allowing for portable technique, the lungs are clear. No pneumothorax.  IMPRESSION: Right IJ central line placed, tip at the lower  SVC level. No pneumothorax or acute cardiopulmonary abnormality.   Electronically Signed   By: Augusto Gamble M.D.   On: 07/01/2013 13:34   Dg Foot Complete Right  07/01/2013   CLINICAL DATA:  Swelling under postoperative 2nd metatarsal site. Evaluate for osteomyelitis.  EXAM: RIGHT FOOT COMPLETE - 3+ VIEW  COMPARISON:  None available for comparison at time of study interpretation.  FINDINGS: No acute fracture deformity or dislocation. Mild 1st metatarsophalangeal joint space narrowing with periarticular sclerosis and marginal spurring consistent with osteoarthrosis. Joint space intact without erosions. No destructive bony lesions. Soft tissue planes are not suspicious.  IMPRESSION: No acute fracture deformity or dislocation. No radiographic findings of osteomyelitis though, if suspicion persists, MRI would be more sensitive.  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   Electronically Signed   By: Awilda Metro   On: 07/01/2013 06:42     Recent Results (from the past 720 hour(s))  WOUND CULTURE     Status: None   Collection Time    07/01/13  5:59 AM      Result Value Range Status   Specimen Description WOUND RIGHT FOOT   Final   Special Requests RIGHT FOOT GREAT TOE   Final   Gram Stain     Final   Value: MODERATE WBC PRESENT, PREDOMINANTLY PMN     NO SQUAMOUS EPITHELIAL CELLS SEEN     ABUNDANT GRAM POSITIVE COCCI IN PAIRS     Performed at [facility]   Culture     Final   Value: ABUNDANT GROUP A STREP (S.PYOGENES) ISOLATED     Performed at [facility]   Report Status PENDING   Incomplete  CULTURE, BLOOD (ROUTINE X 2)     Status: None   Collection Time    07/01/13 10:00 AM      Result Value Range Status   Specimen Description BLOOD LEFT ANTECUBITAL   Final   Special Requests BOTTLES DRAWN AEROBIC ONLY  10CC   Final   Culture  Setup Time     Final   Value: 07/01/2013 15:03     Performed at [facility]   Culture     Final   Value:        BLOOD CULTURE RECEIVED NO GROWTH TO  DATE CULTURE WILL BE HELD FOR 5 DAYS BEFORE ISSUING A FINAL NEGATIVE REPORT     Performed at [facility]   Report Status PENDING   Incomplete  CULTURE, BLOOD (ROUTINE X 2)     Status: None   Collection Time    07/01/13 10:10 AM      Result Value Range Status   Specimen Description BLOOD ARM LEFT   Final   Special Requests BOTTLES DRAWN AEROBIC AND ANAEROBIC 10CC   Final   Culture  Setup Time     Final   Value: 07/01/2013 15:04     Performed at [facility]   Culture     Final   Value:        BLOOD CULTURE RECEIVED NO GROWTH TO DATE CULTURE WILL BE HELD FOR 5 DAYS BEFORE ISSUING A FINAL NEGATIVE REPORT     Performed at [facility]   Report Status PENDING   Incomplete  URINE CULTURE     Status: None   Collection Time    07/01/13 12:13 PM      Result Value Range Status   Specimen Description URINE, CLEAN CATCH   Final   Special Requests NONE   Final   Culture  Setup Time     Final   Value: 07/01/2013 18:10     Performed at [facility] Count     Final   Value: NO GROWTH     Performed at [facility]   Culture     Final   Value: NO GROWTH     Performed at [employer]   Report Status 07/02/2013 FINAL   Final  MRSA PCR SCREENING     Status: None   Collection Time    07/01/13 12:14 PM      Result Value Range Status   MRSA by PCR NEGATIVE  NEGATIVE Final   Comment:            The GeneXpert MRSA Assay (FDA     approved for NASAL specimens     only), is one component of a     comprehensive MRSA colonization     surveillance program. It is not     intended to diagnose MRSA     infection nor to guide or     monitor treatment for     MRSA infections.     Impression/Recommendation  42 year old with foot abscess with GAS abscess in foot with apparent Toxic shock due to Group A vs NEc fascitis (not likely latter)  #1 GAS infection with toxic shock:  --Switch to rocephin 2 grams daily AND ZYVOX 600mg  IV Q 12 to inhibit  toxin production --xray films pending --will repeaet MRI --supportive care --if she develops worsening skin problems transfer to [facility]  #2 Screening: check HIV, hep panel     Thank you so much for this interesting consult  Dr. Ninetta Lights covering tomorrow for me.  [facility] [phone] (pager) [phone] (office) 07/02/2013, 4:21 PM  Paulette Blanch Dam 07/02/2013, 4:21 PM

## 2013-07-02 NOTE — Interval H&P Note (Signed)
History and Physical Interval Note:  07/02/2013 8:34 AM  Shelly [name]  has presented today for surgery, with the diagnosis of RIGHT 1ST MP JOINT ABSCESS  The various methods of treatment have been discussed with the patient and family. After consideration of risks, benefits and other options for treatment, the patient has consented to  Procedure(s): IRRIGATION AND DEBRIDEMENT RIGHT 1ST MP JOINT (Right) as a surgical intervention .  The patient's history has been reviewed, patient examined, no change in status, stable for surgery.  I have reviewed the patient's chart and labs.  Questions were answered to the patient's satisfaction.     Eual Lindstrom F

## 2013-07-03 ENCOUNTER — Inpatient Hospital Stay (HOSPITAL_COMMUNITY): Payer: BC Managed Care – PPO

## 2013-07-03 DIAGNOSIS — A483 Toxic shock syndrome: Secondary | ICD-10-CM

## 2013-07-03 DIAGNOSIS — E8779 Other fluid overload: Secondary | ICD-10-CM

## 2013-07-03 DIAGNOSIS — L02619 Cutaneous abscess of unspecified foot: Secondary | ICD-10-CM

## 2013-07-03 DIAGNOSIS — Y849 Medical procedure, unspecified as the cause of abnormal reaction of the patient, or of later complication, without mention of misadventure at the time of the procedure: Secondary | ICD-10-CM

## 2013-07-03 DIAGNOSIS — B950 Streptococcus, group A, as the cause of diseases classified elsewhere: Secondary | ICD-10-CM

## 2013-07-03 LAB — CBC
HCT: 27 % — ABNORMAL LOW (ref 36.0–46.0)
Hemoglobin: 9.1 g/dL — ABNORMAL LOW (ref 12.0–15.0)
MCH: 29.7 pg (ref 26.0–34.0)
MCHC: 33.7 g/dL (ref 30.0–36.0)
RBC: 3.06 MIL/uL — ABNORMAL LOW (ref 3.87–5.11)
RDW: 13.9 % (ref 11.5–15.5)
WBC: 15.1 10*3/uL — ABNORMAL HIGH (ref 4.0–10.5)

## 2013-07-03 LAB — BASIC METABOLIC PANEL
BUN: 11 mg/dL (ref 6–23)
CO2: 22 mEq/L (ref 19–32)
Calcium: 7.5 mg/dL — ABNORMAL LOW (ref 8.4–10.5)
GFR calc Af Amer: >90 mL/min (ref >90)
GFR calc non Af Amer: >90 mL/min (ref >90)
Glucose, Bld: 94 mg/dL (ref 70–99)
Potassium: 3.9 mEq/L (ref 3.5–5.1)

## 2013-07-03 LAB — WOUND CULTURE

## 2013-07-03 LAB — HEPATITIS PANEL, ACUTE: HCV Ab: NEGATIVE

## 2013-07-03 LAB — HIV ANTIBODY (ROUTINE TESTING W REFLEX): HIV: NONREACTIVE

## 2013-07-03 LAB — VANCOMYCIN, TROUGH: Vancomycin Tr: <5 ug/mL — ABNORMAL LOW (ref 10.0–20.0)

## 2013-07-03 MED ORDER — MUSCLE RUB 10-15 % EX CREA
TOPICAL_CREAM | CUTANEOUS | Status: DC | PRN
  Filled 2013-07-03: qty 85

## 2013-07-03 MED ORDER — IBUPROFEN 100 MG PO CHEW: 400.0000 mg | CHEWABLE_TABLET | Freq: Three times a day (TID) | ORAL | Status: DC | PRN

## 2013-07-03 MED ORDER — GADOBENATE DIMEGLUMINE 529 MG/ML IV SOLN
15.0000 mL | Freq: Once | INTRAVENOUS | Status: AC | PRN
  Administered 2013-07-03: 15 mL via INTRAVENOUS

## 2013-07-03 MED ORDER — IBUPROFEN 100 MG/5ML PO SUSP
400.0000 mg | Freq: Three times a day (TID) | ORAL | Status: DC | PRN
  Administered 2013-07-03 – 2013-07-07 (×6): 400 mg via ORAL
  Filled 2013-07-03 (×8): qty 20

## 2013-07-03 MED ORDER — WHITE PETROLATUM GEL
Status: AC
  Administered 2013-07-03: 0.2
  Filled 2013-07-03: qty 5

## 2013-07-03 MED ORDER — FUROSEMIDE 10 MG/ML IJ SOLN
40.0000 mg | Freq: Once | INTRAMUSCULAR | Status: AC
  Administered 2013-07-03: 40 mg via INTRAVENOUS
  Filled 2013-07-03: qty 4

## 2013-07-03 NOTE — Progress Notes (Signed)
Nutrition Brief Note  RD drawn to chart secondary to current prescription of Zyvox, a medication with potential interactions with high tyramine-containing foods. Hospital diet is low in tyramine and should not cause any interactions with medication.  Body mass index is 27.73 kg/(m^2). Pt meets criteria for overweight based on current BMI.  Current diet order is regular, just advanced from clear liquids this morning. Additional labs and medications reviewed.   No nutrition interventions warranted at this time. RD contact information provided. If additional nutrition issues arise, please re-consult RD.  Joaquin Courts, RD, LDN, CNSC Pager [phone] After Hours Pager [phone]

## 2013-07-03 NOTE — Progress Notes (Signed)
PULMONARY  / CRITICAL CARE MEDICINE  Name: Shelly [name] MRN: [id number] DOB: [birth year]    ADMISSION DATE:  07/01/2013 CONSULTATION DATE:  07/01/2013  REFERRING MD :  Rubin Payor (ED) PRIMARY SERVICE: PCCM  CHIEF COMPLAINT:  Hypotension/Sepsis  BRIEF PATIENT DESCRIPTION: 42 y.o. F who underwent right great toe chilectomy 1 month ago (Dr. Eulah Pont).  Had stitches removed post op week 2 - 3, then wound began to dehisce.  On 11/17, pt began to have fever/chills and generalized weakness/not feeling well.  Came to ER 11/18, PCCM consulted for Sepsis/hypotension.  SIGNIFICANT EVENTS / STUDIES:  10/20 Right great toe chilectomy 11/17 Began to experience fever/chills/weakness. 11/18 Presented to ER, found to be hypotensive and tachycardic.  XRay of R great toe showed no acute fracture or evidence of osteo.  MRI of R great toe showed no evidence of osteo/tenosynovitis/septic joint.  Did show dorsal soft tissue abscess, 6cm diameter.  Levo started. 11/19 To OR for I&D of right toe/foot  LINES / TUBES: Right IJ TLC 11/18 >>>  CULTURES: Blood 11/18 >>> Urine 11/18 >>> neg Wound 11/18 >>>  ANTIBIOTICS: Vanc 11/18 >>> 11/19 Zosyn 11/18 >>> 11/19 Ceftriaxone 11/19 >>> Zyvox 11/19 >>>  SUBJECTIVE:  I&D of R great toe 11/19 by Dr. Eulah Pont with Ortho, pt did well.  Hypotensive overnight, required Levo but able to turn off at 1730 (was down to ). Pt fairly comfortable this AM, mild headache.  Pain post op well controlled.  Chills improved, still feels weak.  Denies CP, SOB, calf pain.   VITAL SIGNS: Temp:  [97.4 F (36.3 C)-101.6 F (38.7 C)] 98.3 F (36.8 C) (11/20 0806) Pulse Rate:  [78-133] 108 (11/20 0940) Resp:  [14-33] 33 (11/20 0940) BP: (83-119)/(46-76) 111/75 mmHg (11/20 0940) SpO2:  [90 %-100 %] 93 % (11/20 0940) Weight:  [77.9 kg (171 lb 11.8 oz)] 77.9 kg (171 lb 11.8 oz) (11/20 0500) HEMODYNAMICS: PAP: (17)/(14) 17/14 mmHg CVP:  [8 mmHg-11 mmHg] 10 mmHg  VENTILATOR  SETTINGS:   INTAKE / OUTPUT: Intake/Output     11/19 0701 - 11/20 0700 11/20 0701 - 11/21 0700   I.V. (mL/kg) 3212.5 (41.2) 300 (3.9)   IV Piggyback 560 300   Total Intake(mL/kg) 3772.5 (48.4) 600 (7.7)   Urine (mL/kg/hr) 2300 (1.2)    Total Output 2300     Net +1472.5 +600        Urine Occurrence 3 x 1 x   Stool Occurrence 1 x 1 x     PHYSICAL EXAMINATION: General:  Pleasant female, lying in bed, improved general appearance. Neuro:  A&O x 3.  Mood and affect appropriate. HEENT:  [place]/AT.  PERRL.  MMM. Cardiovascular:  Tachycardic, no M/R/G. Lungs:  Resps even and unlabored.  CTA bilaterally, no W/R/R. Abdomen:  BS x 4.  Soft, NT/ND. Musculoskeletal:  Dressing of right great toe in place, C/D/I.  No edema. Skin:  Warm to touch and dry.  LABS:  CBC  Recent Labs Lab 07/01/13 1734 07/02/13 0415 07/03/13 0500  WBC 23.7* 24.2* 15.1*  HGB 10.4* 9.7* 9.1*  HCT 29.3* 28.0* 27.0*  PLT 191 167 137*   BMET  Recent Labs Lab 07/01/13 1734 07/02/13 0415 07/03/13 0500  NA 138 139 137  K 3.9 3.7 3.9  CL 107 109 109  CO2 18* 19 22  BUN 16 16 11   CREATININE 1.06 0.95 0.70  GLUCOSE 129* 132* 94   Sepsis Markers  Recent Labs Lab 07/01/13 1020 07/01/13 1300 07/01/13 1734  LATICACIDVEN 1.43  --  2.8*  PROCALCITON  --  12.21  --    Imaging Dg Chest Port 1 View  07/03/2013   CLINICAL DATA:  Chest tightness and cough.  EXAM: PORTABLE CHEST - 1 VIEW  COMPARISON:  07/01/2013  FINDINGS: Development of diffuse interstitial and patchy airspace densities. This is a marked change from the prior examination. Right jugular central venous catheter in the lower SVC region. The left hemidiaphragm is obscured and cannot exclude consolidation or pleural fluid in this area. Heart size is grossly stable.  IMPRESSION: Development of diffuse interstitial and airspace densities. Findings are most compatible with pulmonary edema.  Consolidation or pleural fluid at the left lung base.    Electronically Signed   By: Richarda Overlie M.D.   On: 07/03/2013 07:56   Dg Chest Port 1 View  07/01/2013   CLINICAL DATA:  Chest tightness  EXAM: PORTABLE CHEST - 1 VIEW  COMPARISON:  07/01/2013, 11/15/2009  FINDINGS: Cervical fusion hardware partly visualized. Right IJ central line tip terminates over the distal SVC. Heart size upper limits of normal. Nipple shadow reidentified over the right lung base. No new pulmonary opacity or pleural effusion.  IMPRESSION: No new acute finding.   Electronically Signed   By: Christiana Pellant M.D.   On: 07/01/2013 18:29   Dg Chest Port 1 View  07/01/2013   CLINICAL DATA:  42 year old female with central line placement and fever. Initial encounter.  EXAM: PORTABLE CHEST - 1 VIEW  COMPARISON:  11/15/2009.  FINDINGS: Portable AP semi upright view at at 1316 hrs. Right IJ approach central line. Catheter tip at the lower SVC level between the carinal a and cavoatrial junction. Mildly lower lung volumes. Normal cardiac size and mediastinal contours. Visualized tracheal air column is within normal limits. Cervical ACDF hardware partially visible. Allowing for portable technique, the lungs are clear. No pneumothorax.  IMPRESSION: Right IJ central line placed, tip at the lower SVC level. No pneumothorax or acute cardiopulmonary abnormality.   Electronically Signed   By: Augusto Gamble M.D.   On: 07/01/2013 13:34   Dg Foot Complete Right  07/02/2013   CLINICAL DATA:  Postop right great toe infection  EXAM: RIGHT FOOT COMPLETE - 3+ VIEW  COMPARISON:  MRI 07/01/2013, plain film 07/01/2013  FINDINGS: No osseous abnormality of the 1st digit. No soft tissue abnormality. Mild swelling of dorsum of foot.  IMPRESSION: No osseous abnormality. Swelling over the dorsum of the foot similar to prior. .   Electronically Signed   By: Genevive Bi M.D.   On: 07/02/2013 17:52     CXR: 11/20 - Right IJ CVC in place, slight ATX LLL, edema vs infiltrate RLL.  ASSESSMENT / PLAN:  INFECTIOUS A:    Sepsis/Septic Shock - presumed secondary to right great toe infection s/p surgery 1 month ago. Abscess of right great toe. P:   - Vanc/Zosyn dc'd.  Switched to Ceftriaxone/Vyvox - IV Tylenol PRN. - Monitor WBCs/fever curve - downtrending on 11/20 labs. - f/u cultures. - Contact precautions per ID. - OOB as tolerated with assist. - Ortho and ID following, appreciate their input.  CARDIOVASCULAR A:  Septic Shock Chest Pain -  Troponin negative x 2. P:  - See above (ID section). - IVF resuscitation with NS @ 100. - Goal MAP >= 65. - Goal UOP > 0.76ml/kg/hr - Levophed if needed, titrate to goal MAP.  PULMONARY A: ATX Questionable edema vs infiltrate >>> P:   - Monitor respiratory status. - IS/pulm hygiene. - OOB with  assist as tolerated. - f/u CXR in AM.  RENAL A:   Hypocalcemia - Improved. P:   - Monitor BMP.  GASTROINTESTINAL A:  No acute issue. P:   - Advance diet as tolerated. - SUP: pantoprazole.  HEMATOLOGIC A:  No acute issue. P:  - DVT Px: Heparin SQ.  ENDOCRINE A: No acute issue.  Random Cortisol normal (34.1) - no role for stress steroids. P:   - Monitor CBG's.  NEUROLOGIC A:  No acute issue. P:   - Monitor.  Rutherford Guys, [place] -Leslye Peer, MD, PhD 07/03/2013, 10:28 AM [facility] [phone] or if no answer [phone]

## 2013-07-03 NOTE — Progress Notes (Signed)
INFECTIOUS DISEASE PROGRESS NOTE  ID: Shelly [name] is a 42 y.o. female with  Principal Problem:   SIRS (systemic inflammatory response syndrome) Active Problems:   Right foot infection   Hypotension   Tachycardia   Septic shock  Subjective: C/o headache, fatigue. Breathing feels heavy Denies pain  Abtx:  Anti-infectives   Start     Dose/Rate Route Frequency Ordered Stop   07/02/13 2200  linezolid (ZYVOX) IVPB 600 mg     600 mg 300 mL/hr over 60 Minutes Intravenous Every 12 hours 07/02/13 1602     07/02/13 1615  cefTRIAXone (ROCEPHIN) 2 g in dextrose 5 % 50 mL IVPB     2 g 100 mL/hr over 30 Minutes Intravenous Every 24 hours 07/02/13 1602     07/01/13 2200  [MAR Hold]  piperacillin-tazobactam (ZOSYN) IVPB 3.375 g  Status:  Discontinued     (On MAR Hold since 07/02/13 1304)   3.375 g 12.5 mL/hr over 240 Minutes Intravenous 3 times per day 07/01/13 1147 07/02/13 1602   07/01/13 1400  [MAR Hold]  vancomycin (VANCOCIN) IVPB 750 mg/150 ml premix  Status:  Discontinued     (On MAR Hold since 07/02/13 1304)   750 mg 150 mL/hr over 60 Minutes Intravenous Every 8 hours 07/01/13 1147 07/02/13 1602   07/01/13 1130  piperacillin-tazobactam (ZOSYN) IVPB 3.375 g     3.375 g 100 mL/hr over 30 Minutes Intravenous STAT 07/01/13 1118 07/01/13 1217   07/01/13 0545  vancomycin (VANCOCIN) IVPB 1000 mg/200 mL premix     1,000 mg 200 mL/hr over 60 Minutes Intravenous  Once 07/01/13 0542 07/01/13 0649      Medications:  Scheduled: . cefTRIAXone (ROCEPHIN)  IV  2 g Intravenous Q24H  . heparin  5,000 Units Subcutaneous Q8H  . linezolid  600 mg Intravenous Q12H  . pantoprazole (PROTONIX) IV  40 mg Intravenous QHS    Objective: Vital signs in last 24 hours: Temp:  [97.4 F (36.3 C)-101.6 F (38.7 C)] 98.3 F (36.8 C) (11/20 0806) Pulse Rate:  [78-133] 104 (11/20 0800) Resp:  [14-31] 25 (11/20 0800) BP: (83-119)/(46-76) 107/61 mmHg (11/20 0800) SpO2:  [90 %-100 %] 92 % (11/20  0800) Weight:  [77.9 kg (171 lb 11.8 oz)] 77.9 kg (171 lb 11.8 oz) (11/20 0500)   General appearance: alert, cooperative and no distress Neck: R IJ clean. Resp: clear to auscultation bilaterally Cardio: regular rate and rhythm GI: normal findings: bowel sounds normal and soft, non-tender Extremities: R foot wrapped, toes clean. no proxiaml blistering or erythema.   Lab Results  Recent Labs  07/02/13 0415 07/03/13 0500  WBC 24.2* 15.1*  HGB 9.7* 9.1*  HCT 28.0* 27.0*  NA 139 137  K 3.7 3.9  CL 109 109  CO2 19 22  BUN 16 11  CREATININE 0.95 0.70   Liver Panel  Recent Labs  07/01/13 1734  PROT 4.9*  ALBUMIN 2.5*  AST 18  ALT 17  ALKPHOS 26*  BILITOT 0.4  BILIDIR 0.1  IBILI 0.3   Sedimentation Rate  Recent Labs  07/01/13 1005  ESRSEDRATE 6   C-Reactive Protein  Recent Labs  07/01/13 1005  CRP 3.9*    Microbiology: Recent Results (from the past 240 hour(s))  WOUND CULTURE     Status: None   Collection Time    07/01/13  5:59 AM      Result Value Range Status   Specimen Description WOUND RIGHT FOOT   Final   Special Requests RIGHT  FOOT GREAT TOE   Final   Gram Stain     Final   Value: MODERATE WBC PRESENT, PREDOMINANTLY PMN     NO SQUAMOUS EPITHELIAL CELLS SEEN     ABUNDANT GRAM POSITIVE COCCI IN PAIRS     Performed at [facility]   Culture     Final   Value: ABUNDANT GROUP A STREP (S.PYOGENES) ISOLATED     Performed at [facility]   Report Status 07/03/2013 FINAL   Final  CULTURE, BLOOD (ROUTINE X 2)     Status: None   Collection Time    07/01/13 10:00 AM      Result Value Range Status   Specimen Description BLOOD LEFT ANTECUBITAL   Final   Special Requests BOTTLES DRAWN AEROBIC ONLY 10CC   Final   Culture  Setup Time     Final   Value: 07/01/2013 15:03     Performed at [facility]   Culture     Final   Value:        BLOOD CULTURE RECEIVED NO GROWTH TO DATE CULTURE WILL BE HELD FOR 5 DAYS BEFORE ISSUING A FINAL  NEGATIVE REPORT     Performed at [facility]   Report Status PENDING   Incomplete  CULTURE, BLOOD (ROUTINE X 2)     Status: None   Collection Time    07/01/13 10:10 AM      Result Value Range Status   Specimen Description BLOOD ARM LEFT   Final   Special Requests BOTTLES DRAWN AEROBIC AND ANAEROBIC 10CC   Final   Culture  Setup Time     Final   Value: 07/01/2013 15:04     Performed at [facility]   Culture     Final   Value:        BLOOD CULTURE RECEIVED NO GROWTH TO DATE CULTURE WILL BE HELD FOR 5 DAYS BEFORE ISSUING A FINAL NEGATIVE REPORT     Performed at [facility]   Report Status PENDING   Incomplete  URINE CULTURE     Status: None   Collection Time    07/01/13 12:13 PM      Result Value Range Status   Specimen Description URINE, CLEAN CATCH   Final   Special Requests NONE   Final   Culture  Setup Time     Final   Value: 07/01/2013 18:10     Performed at [facility] Count     Final   Value: NO GROWTH     Performed at [facility]   Culture     Final   Value: NO GROWTH     Performed at [facility]   Report Status 07/02/2013 FINAL   Final  MRSA PCR SCREENING     Status: None   Collection Time    07/01/13 12:14 PM      Result Value Range Status   MRSA by PCR NEGATIVE  NEGATIVE Final   Comment:            The GeneXpert MRSA Assay (FDA     approved for NASAL specimens     only), is one component of a     comprehensive MRSA colonization     surveillance program. It is not     intended to diagnose MRSA     infection nor to guide or     monitor treatment for     MRSA infections.  Studies/Results: Mr Foot Right W Wo Contrast  07/01/2013   CLINICAL DATA:  Large nonhealing wound on the dorsal aspect of the foot.  EXAM: MRI OF THE RIGHT FOREFOOT WITHOUT AND WITH CONTRAST  TECHNIQUE: Multiplanar, multisequence MR imaging was performed both before and after administration of intravenous contrast.  CONTRAST:   14mL MULTIHANCE GADOBENATE DIMEGLUMINE 529 MG/ML IV SOLN  COMPARISON:  Radiographs dated 07/01/2013  FINDINGS: Soft tissue wound is seen in the subcutaneous fat of the dorsum of the foot overlying the head of the 1st metatarsal. There is no significant joint effusion. Minimal edema in the head of the 1st metatarsal is consistent with recent surgery. The finding is not suggestive of osteomyelitis.  There is an abscess which extends laterally across the dorsum of the foot in the subcutaneous fat. There is extensive nonenhancing fluid in the dorsum of the foot which could represent edema or pus. This is contiguous with the abscess which extends medially from the soft tissue wound. This collection of fluid extends over a 6 cm diameter area on the dorsum of the foot.  The other visualized bones of the forefoot are normal. Muscles and tendons appear normal.  After contrast administration there is enhancement of the soft tissues around the dorsal aspect of the 1st metatarsal phalangeal joint consistent with cellulitis.  IMPRESSION: 1. No evidence of osteomyelitis or tenosynovitis. No evidence of septic 1st metatarsal phalangeal joint. 2. Dorsal soft tissue abscess extending laterally across the dorsum of the foot from the soft tissue wound. The abnormal fluid collection extends in a 6 cm diameter area. Some of this could be pus and some could be reactive edema but I cannot differentiate between the two.   Electronically Signed   By: Geanie Cooley M.D.   On: 07/01/2013 09:55   Dg Chest Port 1 View  07/03/2013   CLINICAL DATA:  Chest tightness and cough.  EXAM: PORTABLE CHEST - 1 VIEW  COMPARISON:  07/01/2013  FINDINGS: Development of diffuse interstitial and patchy airspace densities. This is a marked change from the prior examination. Right jugular central venous catheter in the lower SVC region. The left hemidiaphragm is obscured and cannot exclude consolidation or pleural fluid in this area. Heart size is grossly  stable.  IMPRESSION: Development of diffuse interstitial and airspace densities. Findings are most compatible with pulmonary edema.  Consolidation or pleural fluid at the left lung base.   Electronically Signed   By: Richarda Overlie M.D.   On: 07/03/2013 07:56   Dg Chest Port 1 View  07/01/2013   CLINICAL DATA:  Chest tightness  EXAM: PORTABLE CHEST - 1 VIEW  COMPARISON:  07/01/2013, 11/15/2009  FINDINGS: Cervical fusion hardware partly visualized. Right IJ central line tip terminates over the distal SVC. Heart size upper limits of normal. Nipple shadow reidentified over the right lung base. No new pulmonary opacity or pleural effusion.  IMPRESSION: No new acute finding.   Electronically Signed   By: Christiana Pellant M.D.   On: 07/01/2013 18:29   Dg Chest Port 1 View  07/01/2013   CLINICAL DATA:  42 year old female with central line placement and fever. Initial encounter.  EXAM: PORTABLE CHEST - 1 VIEW  COMPARISON:  11/15/2009.  FINDINGS: Portable AP semi upright view at at 1316 hrs. Right IJ approach central line. Catheter tip at the lower SVC level between the carinal a and cavoatrial junction. Mildly lower lung volumes. Normal cardiac size and mediastinal contours. Visualized tracheal air column is within normal limits. Cervical ACDF  hardware partially visible. Allowing for portable technique, the lungs are clear. No pneumothorax.  IMPRESSION: Right IJ central line placed, tip at the lower SVC level. No pneumothorax or acute cardiopulmonary abnormality.   Electronically Signed   By: Augusto Gamble M.D.   On: 07/01/2013 13:34   Dg Foot Complete Right  07/02/2013   CLINICAL DATA:  Postop right great toe infection  EXAM: RIGHT FOOT COMPLETE - 3+ VIEW  COMPARISON:  MRI 07/01/2013, plain film 07/01/2013  FINDINGS: No osseous abnormality of the 1st digit. No soft tissue abnormality. Mild swelling of dorsum of foot.  IMPRESSION: No osseous abnormality. Swelling over the dorsum of the foot similar to prior. .    Electronically Signed   By: Genevive Bi M.D.   On: 07/02/2013 17:52     Assessment/Plan: Toxic Shock Group A Strep abscess R foot Great Toe Chilectomy (removal of bone spurs from base of big toe) 1 month ago with wound dehisc Fluid overload  Total days of antibiotics: Zyvox/ceftriaxone  No change in anbx She appears to be doing well, off pressors, WBC improving Will continue to watch          Johny Sax Infectious Diseases (pager) [phone] [email or web address] 07/03/2013, 8:58 AM  LOS: 2 days

## 2013-07-03 NOTE — Progress Notes (Signed)
Subjective: 1 Day Post-Op Procedure(s) (LRB): IRRIGATION AND DEBRIDEMENT RIGHT 1ST Metaphalangeal JOINT (Right) Patient reports pain as 2 on 0-10 scale.  Patient is doing okay this morning.  She has been voiding and defecating.  She is on a clear liquid diet and is tolerating it well.  She has not had solid food since Monday.  She was given a dose of Zyvox (anti-toxin) last night around 10am.  She has had a mild fever and headache which has been somewhat relieved by Tylenol.    Objective: Vital signs in last 24 hours: Temp:  [97.4 F (36.3 C)-101.6 F (38.7 C)] 99 F (37.2 C) (11/20 0347) Pulse Rate:  [85-133] 85 (11/20 0630) Resp:  [18-31] 18 (11/20 0630) BP: (83-119)/(46-71) 97/71 mmHg (11/20 0630) SpO2:  [90 %-100 %] 100 % (11/20 0630) Weight:  [77.9 kg (171 lb 11.8 oz)] 77.9 kg (171 lb 11.8 oz) (11/20 0500)  Intake/Output from previous day: 11/19 0701 - 11/20 0700 In: 3672.5 [I.V.:3112.5; IV Piggyback:560] Out: 2300 [Urine:2300] Intake/Output this shift: Total I/O In: 1400 [I.V.:1100; IV Piggyback:300] Out: 1200 [Urine:1200]   Recent Labs  07/01/13 0549 07/01/13 1300 07/01/13 1734 07/02/13 0415 07/03/13 0500  HGB 12.6 9.7* 10.4* 9.7* 9.1*    Recent Labs  07/02/13 0415 07/03/13 0500  WBC 24.2* 15.1*  RBC 3.15* 3.06*  HCT 28.0* 27.0*  PLT 167 137*    Recent Labs  07/02/13 0415 07/03/13 0500  NA 139 137  K 3.7 3.9  CL 109 109  CO2 19 22  BUN 16 11  CREATININE 0.95 0.70  GLUCOSE 132* 94  CALCIUM 6.4* 7.5*   No results found for this basename: LABPT, INR,  in the last 72 hours  Neurologically intact ABD soft Neurovascular intact Sensation intact distally Intact pulses distally Dorsiflexion/Plantar flexion intact Compartment soft Intraoperative blisters have spread slightly beyond the lines marked by the skin marker in the OR yesterday.  Some ecchymosis noted to the dorsum, medial, and lateral aspect of the right ankle.  No calf tenderness noted.     Assessment/Plan: 1 Day Post-Op Procedure(s) (LRB): IRRIGATION AND DEBRIDEMENT RIGHT 1ST Metaphalangeal JOINT (Right) Advance diet  Kilo Eshelman F 07/03/2013, 6:56 AM

## 2013-07-04 ENCOUNTER — Inpatient Hospital Stay (HOSPITAL_COMMUNITY): Payer: BC Managed Care – PPO

## 2013-07-04 ENCOUNTER — Encounter (HOSPITAL_COMMUNITY): Payer: Self-pay | Admitting: Orthopedic Surgery

## 2013-07-04 DIAGNOSIS — I059 Rheumatic mitral valve disease, unspecified: Secondary | ICD-10-CM

## 2013-07-04 DIAGNOSIS — J189 Pneumonia, unspecified organism: Secondary | ICD-10-CM

## 2013-07-04 DIAGNOSIS — L02419 Cutaneous abscess of limb, unspecified: Secondary | ICD-10-CM

## 2013-07-04 DIAGNOSIS — D649 Anemia, unspecified: Secondary | ICD-10-CM

## 2013-07-04 LAB — BASIC METABOLIC PANEL
BUN: 10 mg/dL (ref 6–23)
Calcium: 7.8 mg/dL — ABNORMAL LOW (ref 8.4–10.5)
Chloride: 104 mEq/L (ref 96–112)
Creatinine, Ser: 0.72 mg/dL (ref 0.50–1.10)
GFR calc Af Amer: >90 mL/min (ref >90)
GFR calc non Af Amer: >90 mL/min (ref >90)
Glucose, Bld: 103 mg/dL — ABNORMAL HIGH (ref 70–99)
Sodium: 139 mEq/L (ref 135–145)

## 2013-07-04 LAB — CBC
HCT: 26.3 % — ABNORMAL LOW (ref 36.0–46.0)
Hemoglobin: 9.3 g/dL — ABNORMAL LOW (ref 12.0–15.0)
MCH: 30.4 pg (ref 26.0–34.0)
MCHC: 35.4 g/dL (ref 30.0–36.0)
RDW: 13.7 % (ref 11.5–15.5)
WBC: 10.4 10*3/uL (ref 4.0–10.5)

## 2013-07-04 MED ORDER — POTASSIUM CHLORIDE CRYS ER 20 MEQ PO TBCR
60.0000 meq | EXTENDED_RELEASE_TABLET | Freq: Once | ORAL | Status: AC
  Administered 2013-07-04: 60 meq via ORAL
  Filled 2013-07-04: qty 3

## 2013-07-04 NOTE — Progress Notes (Signed)
Subjective: 2 Days Post-Op Procedure(s) (LRB): IRRIGATION AND DEBRIDEMENT RIGHT 1ST Metaphalangeal JOINT (Right) Patient reports pain as 1 on 0-10 scale.  Patient reports that she feels much better today.  Intraoperative blisters are rescinding.   No fever and no chills.  She has very minimal pain.  She has advanced to a low-sodium diet and is doing okay with that.  Bowel and bladder function are good.  No nausea or vomiting noted.  Her wound cultures came back for ABUNDANT GROUP A STREP (S.PYOGENES) ISOLATED.  She is being managed by infectious disease as well as medicine.    Objective: Vital signs in last 24 hours: Temp:  [97.5 F (36.4 C)-101.1 F (38.4 C)] 98.2 F (36.8 C) (11/21 1300) Pulse Rate:  [89-109] 89 (11/21 1300) Resp:  [18-22] 20 (11/21 1300) BP: (113-129)/(75-88) 116/77 mmHg (11/21 1300) SpO2:  [93 %-97 %] 94 % (11/21 1300) Weight:  [74.1 kg (163 lb 5.8 oz)] 74.1 kg (163 lb 5.8 oz) (11/21 0556)  Intake/Output from previous day: 11/20 0701 - 11/21 0700 In: 1200 [I.V.:900; IV Piggyback:300] Out: 1800 [Urine:1800] Intake/Output this shift: Total I/O In: 130 [P.O.:130] Out: 500 [Urine:500]   Recent Labs  07/01/13 1734 07/02/13 0415 07/03/13 0500 07/04/13 0714  HGB 10.4* 9.7* 9.1* 9.3*    Recent Labs  07/03/13 0500 07/04/13 0714  WBC 15.1* 10.4  RBC 3.06* 3.06*  HCT 27.0* 26.3*  PLT 137* 150    Recent Labs  07/03/13 0500 07/04/13 0714  NA 137 139  K 3.9 3.3*  CL 109 104  CO2 22 26  BUN 11 10  CREATININE 0.70 0.72  GLUCOSE 94 103*  CALCIUM 7.5* 7.8*   No results found for this basename: LABPT, INR,  in the last 72 hours  Neurologically intact ABD soft Neurovascular intact Sensation intact distally Intact pulses distally Dorsiflexion/Plantar flexion intact Compartment soft Surgical incision looks good.  Dressing clean and dry.  No blood or purulent discharge noted.     Assessment/Plan: 2 Days Post-Op Procedure(s) (LRB): IRRIGATION AND  DEBRIDEMENT RIGHT 1ST Metaphalangeal JOINT (Right) Advance diet Continue ABX therapy due to Post-op infection  Rhonin Trott F 07/04/2013, 3:47 PM

## 2013-07-04 NOTE — Op Note (Signed)
NAMELEILI, ESKENAZI NO.:  [id number]  MEDICAL RECORD NO.:  [id number]  LOCATION:  6N04C                        FACILITY:  [facility]  PHYSICIAN:  Loreta Ave, M.D. DATE OF BIRTH:  1970-12-27  DATE OF PROCEDURE:  07/02/2013 DATE OF DISCHARGE:                              OPERATIVE REPORT   PREOPERATIVE DIAGNOSES:  Right foot previous condylectomy.  Wound dehiscence with question of abscess versus cellulitis of the wound growing group A strep.  POSTOPERATIVE DIAGNOSES:  Right foot previous condylectomy.  Wound dehiscence with question of abscess versus cellulitis of the wound growing group A strep with only a minimal amount of clear fluid in the subcutaneous tissue.  No gross abscess.  No extension into the bony structures or joint.  PROCEDURE:  Irrigation, debridement and loose primary closure of wound, right foot over great toe.  SURGEON:  Loreta Ave, MD  ASSISTANT:  Skip Mayer, PA  ANESTHESIA:  General.  TOURNIQUET:  Not employed.  SPECIMENS:  None.  CULTURES:  None.  COMPLICATIONS:  None.  DRESSINGS:  Soft compressive.  PROCEDURE IN DETAIL:  The patient was brought to the operating room and after adequate anesthesia had been obtained, right foot was prepped and draped in usual sterile fashion.  At this point, I noted no other abnormalities other than the wound dehiscence and a little bit of edema throughout that.  The wound was explored, the margins debrided back to healthy tissue.  There was edema and a little bit of cloudy fluid in the soft tissue but really nothing extensive and no obvious abscess.  I thoroughly irrigated and debrided out this whole area.  Extensor tendon was intact.  Under sterile technique, I then aspirated the joint and I had no fluid from the joint itself.  After thoroughly irrigating this with pulse lavage, it was then loosely closed with nylon.  At completion, we then looked into the side of her foot and  over a period of 5-10 minutes she developed subcutaneous blisters on the lateral aspect of her foot and ankle.  These were not there at the beginning of the case.  All of her compartments were completely soft.  These went to a certain extent and then stopped.  No neurovascular compromise.  All drapes were taken down, and no other areas of blister or bullous formation was evident.  At that point, I really felt this was a reaction to the strep toxin which was causing her problem in the first place.  After dressing was applied brought to the recovery room.  I then contacted Infectious Disease Service for their input on help in management along with the Intensive Care Service.     Loreta Ave, M.D.     DFM/MEDQ  D:  07/03/2013  T:  07/04/2013  Job:  [id number]

## 2013-07-04 NOTE — Progress Notes (Addendum)
[facility]  Day # 3rocephin Day # 3 zyvox   Subjective: concern about progression of lesions on foot    Antibiotics:  Anti-infectives   Start     Dose/Rate Route Frequency Ordered Stop   07/02/13 2200  linezolid (ZYVOX) IVPB 600 mg     600 mg 300 mL/hr over 60 Minutes Intravenous Every 12 hours 07/02/13 1602     07/02/13 1615  cefTRIAXone (ROCEPHIN) 2 g in dextrose 5 % 50 mL IVPB     2 g 100 mL/hr over 30 Minutes Intravenous Every 24 hours 07/02/13 1602     07/01/13 2200  [MAR Hold]  piperacillin-tazobactam (ZOSYN) IVPB 3.375 g  Status:  Discontinued     (On MAR Hold since 07/02/13 1304)   3.375 g 12.5 mL/hr over 240 Minutes Intravenous 3 times per day 07/01/13 1147 07/02/13 1602   07/01/13 1400  [MAR Hold]  vancomycin (VANCOCIN) IVPB 750 mg/150 ml premix  Status:  Discontinued     (On MAR Hold since 07/02/13 1304)   750 mg 150 mL/hr over 60 Minutes Intravenous Every 8 hours 07/01/13 1147 07/02/13 1602   07/01/13 1130  piperacillin-tazobactam (ZOSYN) IVPB 3.375 g     3.375 g 100 mL/hr over 30 Minutes Intravenous STAT 07/01/13 1118 07/01/13 1217   07/01/13 0545  vancomycin (VANCOCIN) IVPB 1000 mg/200 mL premix     1,000 mg 200 mL/hr over 60 Minutes Intravenous  Once 07/01/13 0542 07/01/13 0649      Medications: Scheduled Meds: . cefTRIAXone (ROCEPHIN)  IV  2 g Intravenous Q24H  . heparin  5,000 Units Subcutaneous Q8H  . linezolid  600 mg Intravenous Q12H   Continuous Infusions:  PRN Meds:.sodium chloride, acetaminophen, fentaNYL, ibuprofen, influenza vac split quadrivalent PF, MUSCLE RUB, ondansetron (ZOFRAN) IV   Objective: Weight change: -8 lb 6 oz (-3.8 kg)  Intake/Output Summary (Last 24 hours) at 07/04/13 1301 Last data filed at 07/04/13 0900  Gross per 24 hour  Intake    620 ml  Output      0 ml  Net    620 ml   Blood pressure 113/75, pulse 98, temperature 97.5 F (36.4 C), temperature source Oral, resp. rate 18, height 5\' 6"   (1.676 m), weight 163 lb 5.8 oz (74.1 kg), last menstrual period 07/04/2013, SpO2 93.00%. Temp:  [97.5 F (36.4 C)-101.1 F (38.4 C)] 97.5 F (36.4 C) (11/21 0556) Pulse Rate:  [97-109] 98 (11/21 0556) Resp:  [18-22] 18 (11/21 0556) BP: (111-129)/(75-88) 113/75 mmHg (11/21 0556) SpO2:  [93 %-97 %] 93 % (11/21 0556) Weight:  [163 lb 5.8 oz (74.1 kg)] 163 lb 5.8 oz (74.1 kg) (11/21 0556)  Physical Exam: General: Alert and awake, oriented x3, flushed  HEENT: anicteric sclera, pupils reactive to light and accommodation, EOMI, oropharynx with "strawberry tongue"      CVS regular rate, normal r,  no murmur rubs or gallops Chest: no wheezing, rales or rhonchi Abdomen:  nondistended, normal bowel sounds,  EXt: right foot with sutures in place, see picture, bullous lesions are coalescing on right lateral foot and new ones on the left             Neuro: nonfocal  Lab Results:  Recent Labs  07/03/13 0500 07/04/13 0714  WBC 15.1* 10.4  HGB 9.1* 9.3*  HCT 27.0* 26.3*  PLT 137* 150    BMET  Recent Labs  07/03/13 0500 07/04/13 0714  NA 137 139  K 3.9 3.3*  CL  109 104  CO2 22 26  GLUCOSE 94 103*  BUN 11 10  CREATININE 0.70 0.72  CALCIUM 7.5* 7.8*    Micro Results: Recent Results (from the past 240 hour(s))  WOUND CULTURE     Status: None   Collection Time    07/01/13  5:59 AM      Result Value Range Status   Specimen Description WOUND RIGHT FOOT   Final   Special Requests RIGHT FOOT GREAT TOE   Final   Gram Stain     Final   Value: MODERATE WBC PRESENT, PREDOMINANTLY PMN     NO SQUAMOUS EPITHELIAL CELLS SEEN     ABUNDANT GRAM POSITIVE COCCI IN PAIRS     Performed at [facility]   Culture     Final   Value: ABUNDANT GROUP A STREP (S.PYOGENES) ISOLATED     Performed at [facility]   Report Status 07/03/2013 FINAL   Final  CULTURE, BLOOD (ROUTINE X 2)     Status: None   Collection Time    07/01/13 10:00 AM      Result Value Range  Status   Specimen Description BLOOD LEFT ANTECUBITAL   Final   Special Requests BOTTLES DRAWN AEROBIC ONLY 10CC   Final   Culture  Setup Time     Final   Value: 07/01/2013 15:03     Performed at [facility]   Culture     Final   Value:        BLOOD CULTURE RECEIVED NO GROWTH TO DATE CULTURE WILL BE HELD FOR 5 DAYS BEFORE ISSUING A FINAL NEGATIVE REPORT     Performed at [facility]   Report Status PENDING   Incomplete  CULTURE, BLOOD (ROUTINE X 2)     Status: None   Collection Time    07/01/13 10:10 AM      Result Value Range Status   Specimen Description BLOOD ARM LEFT   Final   Special Requests BOTTLES DRAWN AEROBIC AND ANAEROBIC 10CC   Final   Culture  Setup Time     Final   Value: 07/01/2013 15:04     Performed at [facility]   Culture     Final   Value:        BLOOD CULTURE RECEIVED NO GROWTH TO DATE CULTURE WILL BE HELD FOR 5 DAYS BEFORE ISSUING A FINAL NEGATIVE REPORT     Performed at [facility]   Report Status PENDING   Incomplete  URINE CULTURE     Status: None   Collection Time    07/01/13 12:13 PM      Result Value Range Status   Specimen Description URINE, CLEAN CATCH   Final   Special Requests NONE   Final   Culture  Setup Time     Final   Value: 07/01/2013 18:10     Performed at [facility] Count     Final   Value: NO GROWTH     Performed at [facility]   Culture     Final   Value: NO GROWTH     Performed at [facility]   Report Status 07/02/2013 FINAL   Final  MRSA PCR SCREENING     Status: None   Collection Time    07/01/13 12:14 PM      Result Value Range Status   MRSA by PCR NEGATIVE  NEGATIVE Final   Comment:  The GeneXpert MRSA Assay (FDA     approved for NASAL specimens     only), is one component of a     comprehensive MRSA colonization     surveillance program. It is not     intended to diagnose MRSA     infection nor to guide or     monitor treatment for      MRSA infections.    Studies/Results: Dg Chest 2 View  07/04/2013   CLINICAL DATA:  Rule out neoplasm.  EXAM: CHEST  2 VIEW  COMPARISON:  Chest x-ray from the same day at 5:56 a.m.  FINDINGS: Slight improvement in pulmonary edema, with still patchy perihilar predominant interstitial and airspace opacities. There may be small pleural effusions. No change in heart size.  IMPRESSION: 1. Improved pulmonary edema. 2. Cannot exclude pulmonary nodules until after there has been complete clearance of edema.   Electronically Signed   By: Tiburcio Pea M.D.   On: 07/04/2013 05:24   Mr Foot Right W Wo Contrast  07/03/2013   ADDENDUM REPORT: 07/03/2013 16:11  ADDENDUM: Correction:  Impression #1 should read, " "Increased cellulitis and infiltrating abscess in the subcutaneous fat of the dorsum of the RIGHT foot."   Electronically Signed   By: Geanie Cooley M.D.   On: 07/03/2013 16:11   07/03/2013   CLINICAL DATA:  Septic shock.  Right foot infection.  EXAM: MRI OF THE RIGHT FOREFOOT WITHOUT AND WITH CONTRAST  TECHNIQUE: Multiplanar, multisequence MR imaging was performed both before and after administration of intravenous contrast.  CONTRAST:  15mL MULTIHANCE GADOBENATE DIMEGLUMINE 529 MG/ML IV SOLN  COMPARISON:  Radiographs dated 07/02/2013 and MRI dated 07/01/2013  FINDINGS: Since the prior examination the patient has developed increased abnormal fluid in the subcutaneous fat of the dorsum of the foot with adjacent edema surrounding these more abnormal fluid collections. This is consistent with extension of the soft tissue abscess more proximally than on the prior study. The abnormal fluid extends across the dorsum of the foot particularly more laterally than previously and extends at least to the level of the distal talus. The more proximal foot is not visible on this exam.  Again noted are postsurgical changes at the 1st metatarsophalangeal joint. The edema in the head of the 1st metatarsal is consistent with a  prior surgery and is not suggestive of osteomyelitis. There is no fluid in the 1st metatarsal phalangeal joint is suggested septic joint.  IMPRESSION: 1. Increased cellulitis and infiltrating abscess in the subcutaneous fat of the dorsum of the left foot. 2. Stable postsurgical changes of the 1st metatarsal phalangeal joint.  Electronically Signed: By: Geanie Cooley M.D. On: 07/03/2013 14:23   Dg Chest Port 1 View  07/04/2013   CLINICAL DATA:  Shortness of breath, increased heart rate with ambulation, postprocedure  EXAM: PORTABLE CHEST - 1 VIEW  COMPARISON:  Portable exam 0723 hr compared to 07/03/2013  FINDINGS: Normal heart size and mediastinal contours for technique.  Interval improvement of bilateral diffuse pulmonary infiltrates question edema though infection is not excluded.  Atelectasis and probable small effusion at left lung base, though cannot exclude underlying infiltrate or nodule.  No pneumothorax.  Prior cervical spine fusion.  IMPRESSION: Interval improvement of diffuse bilateral pulmonary infiltrates question edema versus infection.  Persistent atelectasis and question small effusion at left lung base.   Electronically Signed   By: Ulyses Southward M.D.   On: 07/04/2013 07:52   Dg Chest Port 1 View  07/03/2013  CLINICAL DATA:  Chest tightness and cough.  EXAM: PORTABLE CHEST - 1 VIEW  COMPARISON:  07/01/2013  FINDINGS: Development of diffuse interstitial and patchy airspace densities. This is a marked change from the prior examination. Right jugular central venous catheter in the lower SVC region. The left hemidiaphragm is obscured and cannot exclude consolidation or pleural fluid in this area. Heart size is grossly stable.  IMPRESSION: Development of diffuse interstitial and airspace densities. Findings are most compatible with pulmonary edema.  Consolidation or pleural fluid at the left lung base.   Electronically Signed   By: Richarda Overlie M.D.   On: 07/03/2013 07:56   Dg Foot Complete  Right  07/02/2013   CLINICAL DATA:  Postop right great toe infection  EXAM: RIGHT FOOT COMPLETE - 3+ VIEW  COMPARISON:  MRI 07/01/2013, plain film 07/01/2013  FINDINGS: No osseous abnormality of the 1st digit. No soft tissue abnormality. Mild swelling of dorsum of foot.  IMPRESSION: No osseous abnormality. Swelling over the dorsum of the foot similar to prior. .   Electronically Signed   By: Genevive Bi M.D.   On: 07/02/2013 17:52      Assessment/Plan: Shelly [name] is a 42 y.o. female with   with foot abscess with GAS abscess in foot with apparent Toxic shock due to Group A Strep:   #1 GAS infection with toxic shock: Her bullous lesions on her foot are coalescing on the right side she is new in the left side. I believe this is just simply the natural progression of her reaction to this toxin mediated pathology.  I am skeptical that these new areas represent actual infection or abscess. Orthopedic surgery could stick a needle into one of the lesions and aspirated to see if there is actual pus versus the clear fluid that I expect to be present.  --I will narrow to zyvox alone  --change dressing BID to avoid having any dressing there for prolonged period of time, ? Even be without a dressing her wound is clean  Orthopedics to see  Elevate foot with 3-4 pillows  I spent greater than 45 minutes with the patient including greater than 50% of time in face to face counsel of the patient and in coordination of their care and husband requested that I visit her again later this afternoon  #2 Screening:  HIV, hep panel negative  #3 Lung findingso n CXR likley due to large IVF she received   LOS: 3 days   Paulette Blanch Dam 07/04/2013, 1:01 PM

## 2013-07-04 NOTE — Progress Notes (Addendum)
[facility] PROGRESS NOTE  Libra Gatz UJW:[id number] DOB: 01-May-1971 DOA: 07/01/2013 PCP: Hannah Beat, MD  Brief summary. 42 y.o. F who underwent right great toe chilectomy 1 month ago (Dr. Eulah Pont). Had stitches removed post op week 2 - 3, then wound began to dehisce. On 11/17, pt began to have fever/chills and generalized weakness/not feeling well. Came to ER 11/18, found to have Sepsis/hypotension  Assessment/Plan:  1. S/p septic shock; toxic shock with strep A abscess; of pressors; improving   2. Possible pneumonia BL; CXR: Development of diffuse interstitial and airspace densities -clinically better; cont IV atx, bronchodilators, oxygen;  -obtain echo r/o cardiac cause for edema   3. R foot abscess; s/p I&D +GROUP A STREP (S.PYOGENES)  -cont IV atx; blood, urine c/s NTD   4. Anemia likely dilutional; I/O + 10. L; no s/sof acute bleeding;  -cont monitor;   Code Status: full Family Communication: none at teh bedside (indicate person spoken with, relationship, and if by phone, the number) Disposition Plan: home in 24-48 hours    Consultants:  ID, ortho    10/20 Right great toe chilectomy  11/17 Began to experience fever/chills/weakness.  11/18 Presented to ER, found to be hypotensive and tachycardic. XRay of R great toe showed no acute fracture or evidence of osteo. MRI of R great toe showed no evidence of osteo/tenosynovitis/septic joint. Did show dorsal soft tissue abscess, 6cm diameter. Levo started.  11/19 To OR for I&D of right toe/foot  LINES / TUBES:  Right IJ TLC 11/18 >>>  CULTURES:  Blood 11/18 >>>  Urine 11/18 >>> neg  Wound 11/18 >>>  ANTIBIOTICS:  Vanc 11/18 >>> 11/19  Zosyn 11/18 >>> 11/19  Ceftriaxone 11/19 >>>  Zyvox 11/19 >>>   HPI/Subjective: Alert, feeling better   Objective: Filed Vitals:   07/04/13 0556  BP: 113/75  Pulse: 98  Temp: 97.5 F (36.4 C)  Resp: 18    Intake/Output Summary (Last 24 hours) at 07/04/13 0939 Last  data filed at 07/04/13 0500  Gross per 24 hour  Intake   1000 ml  Output   1800 ml  Net   -800 ml   Filed Weights   07/02/13 0500 07/03/13 0500 07/04/13 0556  Weight: 78.8 kg (173 lb 11.6 oz) 77.9 kg (171 lb 11.8 oz) 74.1 kg (163 lb 5.8 oz)    Exam:   General:  alert  Cardiovascular: S1, S2 RRR  Respiratory: LL crackles   Abdomen: soft, nt ,nd   Musculoskeletal: surgical wound dressed    Data Reviewed: Basic Metabolic Panel:  Recent Labs Lab 07/01/13 0549 07/01/13 1300 07/01/13 1734 07/02/13 0415 07/03/13 0500 07/04/13 0714  NA 139  --  138 139 137 139  K 4.3  --  3.9 3.7 3.9 3.3*  CL 102  --  107 109 109 104  CO2  --   --  18* 19 22 26   GLUCOSE 120*  --  129* 132* 94 103*  BUN 18  --  16 16 11 10   CREATININE 1.00 1.03 1.06 0.95 0.70 0.72  CALCIUM  --   --  6.8* 6.4* 7.5* 7.8*  MG  --   --  1.0* 2.1  --   --   PHOS  --   --  3.2 3.1  --   --    Liver Function Tests:  Recent Labs Lab 07/01/13 1734  AST 18  ALT 17  ALKPHOS 26*  BILITOT 0.4  PROT 4.9*  ALBUMIN 2.5*   No  results found for this basename: LIPASE, AMYLASE,  in the last 168 hours No results found for this basename: AMMONIA,  in the last 168 hours CBC:  Recent Labs Lab 07/01/13 0540  07/01/13 1300 07/01/13 1734 07/02/13 0415 07/03/13 0500 07/04/13 0714  WBC 11.0*  --  16.8* 23.7* 24.2* 15.1* 10.4  NEUTROABS 10.5*  --   --  22.8*  --   --   --   HGB 12.4  < > 9.7* 10.4* 9.7* 9.1* 9.3*  HCT 36.6  < > 28.8* 29.3* 28.0* 27.0* 26.3*  MCV 88.8  --  88.1 87.5 88.9 88.2 85.9  PLT 228  --  179 191 167 137* 150  < > = values in this interval not displayed. Cardiac Enzymes:  Recent Labs Lab 07/01/13 1734 07/02/13 0415 07/02/13 0945  TROPONINI <0.30 <0.30 <0.30   BNP (last 3 results) No results found for this basename: PROBNP,  in the last 8760 hours CBG:  Recent Labs Lab 07/01/13 1213 07/01/13 1948 07/02/13 1212  GLUCAP 104* 113* 94    Recent Results (from the past 240  hour(s))  WOUND CULTURE     Status: None   Collection Time    07/01/13  5:59 AM      Result Value Range Status   Specimen Description WOUND RIGHT FOOT   Final   Special Requests RIGHT FOOT GREAT TOE   Final   Gram Stain     Final   Value: MODERATE WBC PRESENT, PREDOMINANTLY PMN     NO SQUAMOUS EPITHELIAL CELLS SEEN     ABUNDANT GRAM POSITIVE COCCI IN PAIRS     Performed at [facility]   Culture     Final   Value: ABUNDANT GROUP A STREP (S.PYOGENES) ISOLATED     Performed at [facility]   Report Status 07/03/2013 FINAL   Final  CULTURE, BLOOD (ROUTINE X 2)     Status: None   Collection Time    07/01/13 10:00 AM      Result Value Range Status   Specimen Description BLOOD LEFT ANTECUBITAL   Final   Special Requests BOTTLES DRAWN AEROBIC ONLY 10CC   Final   Culture  Setup Time     Final   Value: 07/01/2013 15:03     Performed at [facility]   Culture     Final   Value:        BLOOD CULTURE RECEIVED NO GROWTH TO DATE CULTURE WILL BE HELD FOR 5 DAYS BEFORE ISSUING A FINAL NEGATIVE REPORT     Performed at [facility]   Report Status PENDING   Incomplete  CULTURE, BLOOD (ROUTINE X 2)     Status: None   Collection Time    07/01/13 10:10 AM      Result Value Range Status   Specimen Description BLOOD ARM LEFT   Final   Special Requests BOTTLES DRAWN AEROBIC AND ANAEROBIC 10CC   Final   Culture  Setup Time     Final   Value: 07/01/2013 15:04     Performed at [facility]   Culture     Final   Value:        BLOOD CULTURE RECEIVED NO GROWTH TO DATE CULTURE WILL BE HELD FOR 5 DAYS BEFORE ISSUING A FINAL NEGATIVE REPORT     Performed at [facility]   Report Status PENDING   Incomplete  URINE CULTURE     Status: None   Collection  Time    07/01/13 12:13 PM      Result Value Range Status   Specimen Description URINE, CLEAN CATCH   Final   Special Requests NONE   Final   Culture  Setup Time     Final   Value: 07/01/2013 18:10      Performed at [facility] Count     Final   Value: NO GROWTH     Performed at [facility]   Culture     Final   Value: NO GROWTH     Performed at [facility]   Report Status 07/02/2013 FINAL   Final  MRSA PCR SCREENING     Status: None   Collection Time    07/01/13 12:14 PM      Result Value Range Status   MRSA by PCR NEGATIVE  NEGATIVE Final   Comment:            The GeneXpert MRSA Assay (FDA     approved for NASAL specimens     only), is one component of a     comprehensive MRSA colonization     surveillance program. It is not     intended to diagnose MRSA     infection nor to guide or     monitor treatment for     MRSA infections.     Studies: Dg Chest 2 View  07/04/2013   CLINICAL DATA:  Rule out neoplasm.  EXAM: CHEST  2 VIEW  COMPARISON:  Chest x-ray from the same day at 5:56 a.m.  FINDINGS: Slight improvement in pulmonary edema, with still patchy perihilar predominant interstitial and airspace opacities. There may be small pleural effusions. No change in heart size.  IMPRESSION: 1. Improved pulmonary edema. 2. Cannot exclude pulmonary nodules until after there has been complete clearance of edema.   Electronically Signed   By: Tiburcio Pea M.D.   On: 07/04/2013 05:24   Mr Foot Right W Wo Contrast  07/03/2013   ADDENDUM REPORT: 07/03/2013 16:11  ADDENDUM: Correction:  Impression #1 should read, " "Increased cellulitis and infiltrating abscess in the subcutaneous fat of the dorsum of the RIGHT foot."   Electronically Signed   By: Geanie Cooley M.D.   On: 07/03/2013 16:11   07/03/2013   CLINICAL DATA:  Septic shock.  Right foot infection.  EXAM: MRI OF THE RIGHT FOREFOOT WITHOUT AND WITH CONTRAST  TECHNIQUE: Multiplanar, multisequence MR imaging was performed both before and after administration of intravenous contrast.  CONTRAST:  15mL MULTIHANCE GADOBENATE DIMEGLUMINE 529 MG/ML IV SOLN  COMPARISON:  Radiographs dated 07/02/2013 and MRI  dated 07/01/2013  FINDINGS: Since the prior examination the patient has developed increased abnormal fluid in the subcutaneous fat of the dorsum of the foot with adjacent edema surrounding these more abnormal fluid collections. This is consistent with extension of the soft tissue abscess more proximally than on the prior study. The abnormal fluid extends across the dorsum of the foot particularly more laterally than previously and extends at least to the level of the distal talus. The more proximal foot is not visible on this exam.  Again noted are postsurgical changes at the 1st metatarsophalangeal joint. The edema in the head of the 1st metatarsal is consistent with a prior surgery and is not suggestive of osteomyelitis. There is no fluid in the 1st metatarsal phalangeal joint is suggested septic joint.  IMPRESSION: 1. Increased cellulitis and infiltrating abscess in the subcutaneous fat of the  dorsum of the left foot. 2. Stable postsurgical changes of the 1st metatarsal phalangeal joint.  Electronically Signed: By: Geanie Cooley M.D. On: 07/03/2013 14:23   Dg Chest Port 1 View  07/04/2013   CLINICAL DATA:  Shortness of breath, increased heart rate with ambulation, postprocedure  EXAM: PORTABLE CHEST - 1 VIEW  COMPARISON:  Portable exam 0723 hr compared to 07/03/2013  FINDINGS: Normal heart size and mediastinal contours for technique.  Interval improvement of bilateral diffuse pulmonary infiltrates question edema though infection is not excluded.  Atelectasis and probable small effusion at left lung base, though cannot exclude underlying infiltrate or nodule.  No pneumothorax.  Prior cervical spine fusion.  IMPRESSION: Interval improvement of diffuse bilateral pulmonary infiltrates question edema versus infection.  Persistent atelectasis and question small effusion at left lung base.   Electronically Signed   By: Ulyses Southward M.D.   On: 07/04/2013 07:52   Dg Chest Port 1 View  07/03/2013   CLINICAL DATA:   Chest tightness and cough.  EXAM: PORTABLE CHEST - 1 VIEW  COMPARISON:  07/01/2013  FINDINGS: Development of diffuse interstitial and patchy airspace densities. This is a marked change from the prior examination. Right jugular central venous catheter in the lower SVC region. The left hemidiaphragm is obscured and cannot exclude consolidation or pleural fluid in this area. Heart size is grossly stable.  IMPRESSION: Development of diffuse interstitial and airspace densities. Findings are most compatible with pulmonary edema.  Consolidation or pleural fluid at the left lung base.   Electronically Signed   By: Richarda Overlie M.D.   On: 07/03/2013 07:56   Dg Foot Complete Right  07/02/2013   CLINICAL DATA:  Postop right great toe infection  EXAM: RIGHT FOOT COMPLETE - 3+ VIEW  COMPARISON:  MRI 07/01/2013, plain film 07/01/2013  FINDINGS: No osseous abnormality of the 1st digit. No soft tissue abnormality. Mild swelling of dorsum of foot.  IMPRESSION: No osseous abnormality. Swelling over the dorsum of the foot similar to prior. .   Electronically Signed   By: Genevive Bi M.D.   On: 07/02/2013 17:52    Scheduled Meds: . cefTRIAXone (ROCEPHIN)  IV  2 g Intravenous Q24H  . heparin  5,000 Units Subcutaneous Q8H  . linezolid  600 mg Intravenous Q12H   Continuous Infusions: . lactated ringers 50 mL/hr at 07/03/13 1634    Principal Problem:   SIRS (systemic inflammatory response syndrome) Active Problems:   Right foot infection   Hypotension   Tachycardia   Septic shock    Time spent: >35 minutes     Esperanza Sheets  [facility] Pager [phone]. If 7PM-7AM, please contact night-coverage at [email or web address], password [facility] 07/04/2013, 9:39 AM  LOS: 3 days

## 2013-07-05 DIAGNOSIS — I309 Acute pericarditis, unspecified: Secondary | ICD-10-CM

## 2013-07-05 DIAGNOSIS — I5021 Acute systolic (congestive) heart failure: Secondary | ICD-10-CM

## 2013-07-05 DIAGNOSIS — I509 Heart failure, unspecified: Secondary | ICD-10-CM

## 2013-07-05 LAB — BASIC METABOLIC PANEL
BUN: 9 mg/dL (ref 6–23)
Calcium: 8 mg/dL — ABNORMAL LOW (ref 8.4–10.5)
Chloride: 103 mEq/L (ref 96–112)
Creatinine, Ser: 0.64 mg/dL (ref 0.50–1.10)
GFR calc Af Amer: >90 mL/min (ref >90)
GFR calc non Af Amer: >90 mL/min (ref >90)
Glucose, Bld: 117 mg/dL — ABNORMAL HIGH (ref 70–99)

## 2013-07-05 MED ORDER — LINEZOLID 600 MG PO TABS
600.0000 mg | ORAL_TABLET | Freq: Two times a day (BID) | ORAL | Status: DC
  Administered 2013-07-05 – 2013-07-08 (×6): 600 mg via ORAL
  Filled 2013-07-05 (×9): qty 1

## 2013-07-05 MED ORDER — FUROSEMIDE 10 MG/ML IJ SOLN
20.0000 mg | Freq: Every day | INTRAMUSCULAR | Status: DC
  Administered 2013-07-06 – 2013-07-07 (×2): 20 mg via INTRAVENOUS
  Filled 2013-07-05 (×6): qty 2

## 2013-07-05 MED ORDER — FUROSEMIDE 10 MG/ML IJ SOLN
INTRAMUSCULAR | Status: AC
  Administered 2013-07-05: 20 mg
  Filled 2013-07-05: qty 4

## 2013-07-05 MED ORDER — IBUPROFEN 600 MG PO TABS
600.0000 mg | ORAL_TABLET | Freq: Three times a day (TID) | ORAL | Status: DC
  Administered 2013-07-05 – 2013-07-08 (×7): 600 mg via ORAL
  Filled 2013-07-05 (×12): qty 1

## 2013-07-05 MED ORDER — POTASSIUM CHLORIDE CRYS ER 20 MEQ PO TBCR
20.0000 meq | EXTENDED_RELEASE_TABLET | Freq: Two times a day (BID) | ORAL | Status: DC
  Administered 2013-07-05 (×2): 20 meq via ORAL
  Filled 2013-07-05 (×6): qty 1

## 2013-07-05 NOTE — Progress Notes (Signed)
[facility]    Day # 4 zyvox   Subjective: feels better    Antibiotics:  Anti-infectives   Start     Dose/Rate Route Frequency Ordered Stop   07/05/13 1000  linezolid (ZYVOX) tablet 600 mg     600 mg Oral Every 12 hours 07/05/13 0955     07/02/13 2200  linezolid (ZYVOX) IVPB 600 mg  Status:  Discontinued     600 mg 300 mL/hr over 60 Minutes Intravenous Every 12 hours 07/02/13 1602 07/05/13 0955   07/02/13 1615  cefTRIAXone (ROCEPHIN) 2 g in dextrose 5 % 50 mL IVPB  Status:  Discontinued     2 g 100 mL/hr over 30 Minutes Intravenous Every 24 hours 07/02/13 1602 07/04/13 1308   07/01/13 2200  [MAR Hold]  piperacillin-tazobactam (ZOSYN) IVPB 3.375 g  Status:  Discontinued     (On MAR Hold since 07/02/13 1304)   3.375 g 12.5 mL/hr over 240 Minutes Intravenous 3 times per day 07/01/13 1147 07/02/13 1602   07/01/13 1400  [MAR Hold]  vancomycin (VANCOCIN) IVPB 750 mg/150 ml premix  Status:  Discontinued     (On MAR Hold since 07/02/13 1304)   750 mg 150 mL/hr over 60 Minutes Intravenous Every 8 hours 07/01/13 1147 07/02/13 1602   07/01/13 1130  piperacillin-tazobactam (ZOSYN) IVPB 3.375 g     3.375 g 100 mL/hr over 30 Minutes Intravenous STAT 07/01/13 1118 07/01/13 1217   07/01/13 0545  vancomycin (VANCOCIN) IVPB 1000 mg/200 mL premix     1,000 mg 200 mL/hr over 60 Minutes Intravenous  Once 07/01/13 0542 07/01/13 0649      Medications: Scheduled Meds: . furosemide  20 mg Intravenous Daily  . heparin  5,000 Units Subcutaneous Q8H  . linezolid  600 mg Oral Q12H  . potassium chloride  20 mEq Oral BID   Continuous Infusions:  PRN Meds:.sodium chloride, acetaminophen, fentaNYL, ibuprofen, influenza vac split quadrivalent PF, MUSCLE RUB, ondansetron (ZOFRAN) IV   Objective: Weight change:   Intake/Output Summary (Last 24 hours) at 07/05/13 0956 Last data filed at 07/04/13 1902  Gross per 24 hour  Intake    130 ml  Output    500 ml  Net   -370  ml   Blood pressure 111/86, pulse 82, temperature 97.9 F (36.6 C), temperature source Oral, resp. rate 18, height 5\' 6"  (1.676 m), weight 163 lb 5.8 oz (74.1 kg), last menstrual period 07/04/2013, SpO2 97.00%. Temp:  [97.9 F (36.6 C)-98.2 F (36.8 C)] 97.9 F (36.6 C) (11/22 4098) Pulse Rate:  [82-89] 82 (11/22 0608) Resp:  [18-20] 18 (11/22 1191) BP: (111-122)/(74-86) 111/86 mmHg (11/22 0608) SpO2:  [94 %-97 %] 97 % (11/22 4782)  Physical Exam: General: Alert and awake, oriented x3, flushed  HEENT: anicteric sclera, pupils reactive to light and accommodation, EOMI, oropharynx with "strawberry tongue"better  CVS regular rate, normal r,  no murmur rubs or gallops Chest: no wheezing, rales or rhonchi Abdomen:  nondistended, normal bowel sounds,  EXt: right foot with sutures in place, see picture, bullous lesions are coalescing on right lateral foot and new ones on the left all progressing. Some more purplish likely due to bruising      Neuro: nonfocal  Lab Results:  Recent Labs  07/03/13 0500 07/04/13 0714  WBC 15.1* 10.4  HGB 9.1* 9.3*  HCT 27.0* 26.3*  PLT 137* 150    BMET  Recent Labs  07/03/13 0500 07/04/13 0714  NA 137  139  K 3.9 3.3*  CL 109 104  CO2 22 26  GLUCOSE 94 103*  BUN 11 10  CREATININE 0.70 0.72  CALCIUM 7.5* 7.8*    Micro Results: Recent Results (from the past 240 hour(s))  WOUND CULTURE     Status: None   Collection Time    07/01/13  5:59 AM      Result Value Range Status   Specimen Description WOUND RIGHT FOOT   Final   Special Requests RIGHT FOOT GREAT TOE   Final   Gram Stain     Final   Value: MODERATE WBC PRESENT, PREDOMINANTLY PMN     NO SQUAMOUS EPITHELIAL CELLS SEEN     ABUNDANT GRAM POSITIVE COCCI IN PAIRS     Performed at [facility]   Culture     Final   Value: ABUNDANT GROUP A STREP (S.PYOGENES) ISOLATED     Performed at [facility]   Report Status 07/03/2013 FINAL   Final  CULTURE, BLOOD  (ROUTINE X 2)     Status: None   Collection Time    07/01/13 10:00 AM      Result Value Range Status   Specimen Description BLOOD LEFT ANTECUBITAL   Final   Special Requests BOTTLES DRAWN AEROBIC ONLY 10CC   Final   Culture  Setup Time     Final   Value: 07/01/2013 15:03     Performed at [facility]   Culture     Final   Value:        BLOOD CULTURE RECEIVED NO GROWTH TO DATE CULTURE WILL BE HELD FOR 5 DAYS BEFORE ISSUING A FINAL NEGATIVE REPORT     Performed at [facility]   Report Status PENDING   Incomplete  CULTURE, BLOOD (ROUTINE X 2)     Status: None   Collection Time    07/01/13 10:10 AM      Result Value Range Status   Specimen Description BLOOD ARM LEFT   Final   Special Requests BOTTLES DRAWN AEROBIC AND ANAEROBIC 10CC   Final   Culture  Setup Time     Final   Value: 07/01/2013 15:04     Performed at [facility]   Culture     Final   Value:        BLOOD CULTURE RECEIVED NO GROWTH TO DATE CULTURE WILL BE HELD FOR 5 DAYS BEFORE ISSUING A FINAL NEGATIVE REPORT     Performed at [facility]   Report Status PENDING   Incomplete  URINE CULTURE     Status: None   Collection Time    07/01/13 12:13 PM      Result Value Range Status   Specimen Description URINE, CLEAN CATCH   Final   Special Requests NONE   Final   Culture  Setup Time     Final   Value: 07/01/2013 18:10     Performed at [facility] Count     Final   Value: NO GROWTH     Performed at [facility]   Culture     Final   Value: NO GROWTH     Performed at [facility]   Report Status 07/02/2013 FINAL   Final  MRSA PCR SCREENING     Status: None   Collection Time    07/01/13 12:14 PM      Result Value Range Status   MRSA by PCR NEGATIVE  NEGATIVE Final   Comment:            The GeneXpert MRSA Assay (FDA     approved for NASAL specimens     only), is one component of a     comprehensive MRSA colonization     surveillance program. It is  not     intended to diagnose MRSA     infection nor to guide or     monitor treatment for     MRSA infections.    Studies/Results: Dg Chest 2 View  07/04/2013   CLINICAL DATA:  Rule out neoplasm.  EXAM: CHEST  2 VIEW  COMPARISON:  Chest x-ray from the same day at 5:56 a.m.  FINDINGS: Slight improvement in pulmonary edema, with still patchy perihilar predominant interstitial and airspace opacities. There may be small pleural effusions. No change in heart size.  IMPRESSION: 1. Improved pulmonary edema. 2. Cannot exclude pulmonary nodules until after there has been complete clearance of edema.   Electronically Signed   By: Tiburcio Pea M.D.   On: 07/04/2013 05:24   Mr Foot Right W Wo Contrast  07/03/2013   ADDENDUM REPORT: 07/03/2013 16:11  ADDENDUM: Correction:  Impression #1 should read, " "Increased cellulitis and infiltrating abscess in the subcutaneous fat of the dorsum of the RIGHT foot."   Electronically Signed   By: Geanie Cooley M.D.   On: 07/03/2013 16:11   07/03/2013   CLINICAL DATA:  Septic shock.  Right foot infection.  EXAM: MRI OF THE RIGHT FOREFOOT WITHOUT AND WITH CONTRAST  TECHNIQUE: Multiplanar, multisequence MR imaging was performed both before and after administration of intravenous contrast.  CONTRAST:  15mL MULTIHANCE GADOBENATE DIMEGLUMINE 529 MG/ML IV SOLN  COMPARISON:  Radiographs dated 07/02/2013 and MRI dated 07/01/2013  FINDINGS: Since the prior examination the patient has developed increased abnormal fluid in the subcutaneous fat of the dorsum of the foot with adjacent edema surrounding these more abnormal fluid collections. This is consistent with extension of the soft tissue abscess more proximally than on the prior study. The abnormal fluid extends across the dorsum of the foot particularly more laterally than previously and extends at least to the level of the distal talus. The more proximal foot is not visible on this exam.  Again noted are postsurgical changes at  the 1st metatarsophalangeal joint. The edema in the head of the 1st metatarsal is consistent with a prior surgery and is not suggestive of osteomyelitis. There is no fluid in the 1st metatarsal phalangeal joint is suggested septic joint.  IMPRESSION: 1. Increased cellulitis and infiltrating abscess in the subcutaneous fat of the dorsum of the left foot. 2. Stable postsurgical changes of the 1st metatarsal phalangeal joint.  Electronically Signed: By: Geanie Cooley M.D. On: 07/03/2013 14:23   Dg Chest Port 1 View  07/04/2013   CLINICAL DATA:  Shortness of breath, increased heart rate with ambulation, postprocedure  EXAM: PORTABLE CHEST - 1 VIEW  COMPARISON:  Portable exam 0723 hr compared to 07/03/2013  FINDINGS: Normal heart size and mediastinal contours for technique.  Interval improvement of bilateral diffuse pulmonary infiltrates question edema though infection is not excluded.  Atelectasis and probable small effusion at left lung base, though cannot exclude underlying infiltrate or nodule.  No pneumothorax.  Prior cervical spine fusion.  IMPRESSION: Interval improvement of diffuse bilateral pulmonary infiltrates question edema versus infection.  Persistent atelectasis and question small effusion at left lung base.   Electronically Signed   By: Ulyses Southward  M.D.   On: 07/04/2013 07:52      Assessment/Plan: Shelly [name] is a 42 y.o. female with   with foot abscess with GAS abscess in foot with apparent Toxic shock due to Group A Strep:   #1 GAS infection with toxic shock: Her bullous lesions on her foot are coalescing on the right side she is new in the left side. I believe this is just simply the natural progression of her reaction to this toxin mediated pathology.  I am skeptical that these new areas represent actual infection or abscess. Orthopedic surgery could stick a needle into one of the lesions and aspirated to see if there is actual pus versus the clear fluid that I expect to be  present.  --I will change to ORALzyvox alone  --Elevate foot with 3-4 pillows  --I would like for the pt to have 10-14 days postoperative antibiotics  This could be with oral zyvox IF her insurance will cover this  OR it could be with   Oral clindamycin 450mg  QID  I think IF surgery IS not planning more interventions she could e dc to home with po abx and elevation of her leg  I will arrange HSFU in my clinic   LOS: 4 days   Acey Lav 07/05/2013, 9:56 AM

## 2013-07-05 NOTE — Consult Note (Signed)
Admit date: 07/01/2013 Referring Physician  Dr. York Spaniel Primary Physician Hannah Beat, MD Primary Cardiologist  None Reason for Consultation  EF 18%  HPI: 42 year-old female with foot abscess with group A strep and apparent toxic shock currently recuperating on IV antibiotics with newly diagnosed ejection fraction of 45% with mildly dilated left ventricle, diffuse hypokinesia. Left pleural effusion was also seen on echocardiogram.  Prior to this admission, she was quite active, aerobics for instance. In late October 2014 she underwent right great toe procedure, had stitches removed postop week 2-3 then her wound began to dehisce. On 11/17 she began to have fever, chills, weakness and general malaise. On November 18 she was found to have hypotension/sepsis/vasodilatory shock.  Currently other than generalized weakness. She does feel some shortness of breath. She also feels as though when she tries to take in a deep breath, she feels subtle chest discomfort mid chest wall. She also feels slightly more uncomfortable when laying on her left side but when laying on her back she is fine.  She was placed given low-dose Lasix and EKG showed sinus rhythm with no other abnormalities.  PMH:   Past Medical History  Diagnosis Date  . GERD (gastroesophageal reflux disease)   . Medical history non-contributory     PSH:   Past Surgical History  Procedure Laterality Date  . Cesarean section  2008    complete placenta pervia, hemmorage  . Knee surgery  [phone]    7 surgeries left knee(2 lateral releases, 1 tibial tubercle oxtomy, 1 patellectomy)  . Cervical fusion  2011  . Cheilectomy Left 05/01/2013    Procedure: LEFT GREAT TOE HALLUX RIGIDUS WITH CHEILECTOMY 1ST METATARSOPHALANGEAL;  Surgeon: Loreta Ave, MD;  Location: [facility];  Service: Orthopedics;  Laterality: Left;  . Carpal tunnel release Left   . Cheilectomy Right 06/02/2013    procedule:right great toe  chielectomy  . I&d extremity Right 07/02/2013    Procedure: IRRIGATION AND DEBRIDEMENT RIGHT 1ST Metaphalangeal JOINT;  Surgeon: Loreta Ave, MD;  Location: [facility] OR;  Service: Orthopedics;  Laterality: Right;   Allergies:  Dilaudid and Codeine Prior to Admit Meds:   Prior to Admission medications   Medication Sig Start Date End Date Taking? Authorizing Provider  acetaminophen (TYLENOL) 500 MG tablet Take 1,000 mg by mouth every 6 (six) hours as needed for mild pain.   Yes Historical Provider, MD  ibuprofen (ADVIL,MOTRIN) 200 MG tablet Take 400 mg by mouth every 6 (six) hours as needed for moderate pain.    Yes Historical Provider, MD  Multiple Vitamin (MULTIVITAMIN) tablet Take 1 tablet by mouth daily.     Yes Historical Provider, MD   Fam HX:    Family History  Problem Relation Age of Onset  . Hypertension Mother   . Hyperlipidemia Mother   . Hypertension Father   . Alzheimer's disease Father   . Migraines Sister   . Kidney disease Maternal Grandmother   . Heart attack Maternal Grandfather 44   Social HX:    History   Social History  . Marital Status: Married    Spouse Name: N/A    Number of Children: 4  . Years of Education: N/A   Occupational History  . stay at home mom    Social History Main Topics  . Smoking status: Former Smoker    Quit date: 11/03/1994  . Smokeless tobacco: Not on file  . Alcohol Use: No  . Drug Use: No  .  Sexual Activity: Not on file   Other Topics Concern  . Not on file   Social History Narrative   Exercise 2 times weekly      Diet : Fruit, veggies, water     ROS: Denies any chest pain All 11 ROS were addressed and are negative except what is stated in the HPI   Physical Exam: Blood pressure 111/86, pulse 82, temperature 97.9 F (36.6 C), temperature source Oral, resp. rate 18, height 5\' 6"  (1.676 m), weight 163 lb 5.8 oz (74.1 kg), last menstrual period 07/04/2013, SpO2 97.00%.   General: Well developed, well nourished, in no  acute distress Head: Eyes PERRLA, No xanthomas.   Normal cephalic and atramatic  Lungs:   Clear bilaterally to auscultation and percussion. Normal respiratory effort. No wheezes, no rales. Heart:   HRRR S1 S2 Pulses are 2+ & equal. No murmur, subtle rub, gallops.  No carotid bruit. No JVD.  No abdominal bruits.  Abdomen: Bowel sounds are positive, abdomen soft and non-tender without masses. No hepatosplenomegaly. Msk:  Back normal. Normal strength and tone for age. Extremities:  Foot with erythema Neuro: Alert and oriented X 3, non-focal, MAE x 4 GU: Deferred Rectal: Deferred Psych:  Good affect, responds appropriately      Labs: Lab Results  Component Value Date   WBC 10.4 07/04/2013   HGB 9.3* 07/04/2013   HCT 26.3* 07/04/2013   MCV 85.9 07/04/2013   PLT 150 07/04/2013     Recent Labs Lab 07/01/13 1734  07/05/13 1020  NA 138  < > 139  K 3.9  < > 2.9*  CL 107  < > 103  CO2 18*  < > 28  BUN 16  < > 9  CREATININE 1.06  < > 0.64  CALCIUM 6.8*  < > 8.0*  PROT 4.9*  --   --   BILITOT 0.4  --   --   ALKPHOS 26*  --   --   ALT 17  --   --   AST 18  --   --   GLUCOSE 129*  < > 117*  < > = values in this interval not displayed. No results found for this basename: CKTOTAL, CKMB, TROPONINI,  in the last 72 hours Lab Results  Component Value Date   CHOL 191 09/01/2010   HDL 64.60 09/01/2010   LDLCALC 116* 09/01/2010   TRIG 54.0 09/01/2010   No results found for this basename: DDIMER     Radiology:  Dg Chest 2 View  07/04/2013   CLINICAL DATA:  Rule out neoplasm.  EXAM: CHEST  2 VIEW  COMPARISON:  Chest x-ray from the same day at 5:56 a.m.  FINDINGS: Slight improvement in pulmonary edema, with still patchy perihilar predominant interstitial and airspace opacities. There may be small pleural effusions. No change in heart size.  IMPRESSION: 1. Improved pulmonary edema. 2. Cannot exclude pulmonary nodules until after there has been complete clearance of edema.   Electronically  Signed   By: Tiburcio Pea M.D.   On: 07/04/2013 05:24   Dg Chest Port 1 View  07/04/2013   CLINICAL DATA:  Shortness of breath, increased heart rate with ambulation, postprocedure  EXAM: PORTABLE CHEST - 1 VIEW  COMPARISON:  Portable exam 0723 hr compared to 07/03/2013  FINDINGS: Normal heart size and mediastinal contours for technique.  Interval improvement of bilateral diffuse pulmonary infiltrates question edema though infection is not excluded.  Atelectasis and probable small effusion at left lung base,  though cannot exclude underlying infiltrate or nodule.  No pneumothorax.  Prior cervical spine fusion.  IMPRESSION: Interval improvement of diffuse bilateral pulmonary infiltrates question edema versus infection.  Persistent atelectasis and question small effusion at left lung base.   Electronically Signed   By: Ulyses Southward M.D.   On: 07/04/2013 07:52   Personally viewed.  EKG:  Sinus rhythm, no other abnormalities. Personally viewed.   ASSESSMENT/PLAN:    42 year old female with recent episode of vasodilatory shock secondary to group A strep septic infection with newly discovered ejection fraction mildly reduced at 45%, global hypokinesia with left pleural effusion, subtle cardiac rub.  1. Mildly reduced left ventricular ejection fraction/systolic function-this is likely secondary to episode of septic shock/vasodilatory shock and inflammatory cytokines. Hopefully over the next several days to weeks, her ejection fraction will return to normal.  In addition, with her mild cardiac rub heard on exam and left pleural effusion, as well as some positional chest discomfort as well as pleuritic chest pain with deep breath she may have a component of pericarditis. She has ibuprofen written for when necessary. I will write for her to get ibuprofen 600 mg by mouth Q8 hours. She should take this with meals. We will give this to her for the next 7 days.  With her relative hypotension, I do not want to  initiate any ACE inhibitor or beta blocker. We will continue to monitor.  2. Hypokalemia-she's having her potassium repleted. Telemetry shows no adverse arrhythmias.  3. Group A strep foot infection. IV antibiotics-infectious disease/orthopedics.  We will continue to follow.  Donato Schultz, MD  07/05/2013  4:56 PM

## 2013-07-05 NOTE — Progress Notes (Signed)
SPORTS MEDICINE AND JOINT REPLACEMENT  Georgena Spurling, MD   Altamese Cabal, PA-C [address] [place], [place], [place]  [address]                             [phone]   PROGRESS NOTE  Subjective:  negative for Chest Pain  negative for Shortness of Breath  negative for Nausea/Vomiting   negative for Calf Pain  negative for Bowel Movement   Tolerating Diet: yes         Patient reports pain as 1 on 0-10 scale.    Objective: Vital signs in last 24 hours:   Patient Vitals for the past 24 hrs:  BP Temp Temp src Pulse Resp SpO2  07/05/13 0608 111/86 mmHg 97.9 F (36.6 C) Oral 82 18 97 %  07/04/13 2124 122/74 mmHg 98.2 F (36.8 C) Oral 85 18 97 %  07/04/13 1616 - 98.1 F (36.7 C) Oral - - -  07/04/13 1300 116/77 mmHg 98.2 F (36.8 C) Oral 89 20 94 %    @flow {1959:LAST@   Intake/Output from previous day:   11/21 0701 - 11/22 0700 In: 250 [P.O.:250] Out: 500 [Urine:500]   Intake/Output this shift:       Intake/Output     11/21 0701 - 11/22 0700 11/22 0701 - 11/23 0700   P.O. 250    I.V. (mL/kg)     IV Piggyback     Total Intake(mL/kg) 250 (3.4)    Urine (mL/kg/hr) 500 (0.3)    Total Output 500     Net -250          Urine Occurrence 1 x       LABORATORY DATA:  Recent Labs  07/01/13 0540 07/01/13 0549 07/01/13 1300 07/01/13 1734 07/02/13 0415 07/03/13 0500 07/04/13 0714  WBC 11.0*  --  16.8* 23.7* 24.2* 15.1* 10.4  HGB 12.4 12.6 9.7* 10.4* 9.7* 9.1* 9.3*  HCT 36.6 37.0 28.8* 29.3* 28.0* 27.0* 26.3*  PLT 228  --  179 191 167 137* 150    Recent Labs  07/01/13 0549 07/01/13 1300 07/01/13 1734 07/02/13 0415 07/03/13 0500 07/04/13 0714  NA 139  --  138 139 137 139  K 4.3  --  3.9 3.7 3.9 3.3*  CL 102  --  107 109 109 104  CO2  --   --  18* 19 22 26   BUN 18  --  16 16 11 10   CREATININE 1.00 1.03 1.06 0.95 0.70 0.72  GLUCOSE 120*  --  129* 132* 94 103*  CALCIUM  --   --  6.8* 6.4* 7.5* 7.8*   No results found for this basename: INR, PROTIME     Examination:  General appearance: alert, cooperative and no distress Extremities: Homans sign is negative, no sign of DVT  Wound Exam: clean, dry, intact   Drainage:  None: wound tissue dry  Motor Exam: EHL and FHL Intact  Sensory Exam: Deep Peroneal normal   Assessment:    3 Days Post-Op  Procedure(s) (LRB): IRRIGATION AND DEBRIDEMENT RIGHT 1ST Metaphalangeal JOINT (Right)  ADDITIONAL DIAGNOSIS:  Principal Problem:   SIRS (systemic inflammatory response syndrome) Active Problems:   Right foot infection   Hypotension   Tachycardia   Septic shock     Plan:    Continue ABX therapy due to Post-op infection   EKG ordered for f/u tachycardia  Novie Maggio 07/05/2013, 11:13 AM

## 2013-07-05 NOTE — Progress Notes (Addendum)
[facility] PROGRESS NOTE  Lorisa Scheid ZOX:[id number] DOB: 1970-10-06 DOA: 07/01/2013 PCP: Hannah Beat, MD  Brief summary. 42 y.o. F who underwent right great toe chilectomy 1 month ago (Dr. Eulah Pont). Had stitches removed post op week 2 - 3, then wound began to dehisce. On 11/17, pt began to have fever/chills and generalized weakness/not feeling well. Came to ER 11/18, found to have Sepsis/hypotension  Assessment/Plan:  1. S/p septic shock; toxic shock with strep A abscess; of pressors; improving   2. Possible pneumonia BL; CXR: Development of diffuse interstitial and airspace densities -clinically better; cont IV atx, bronchodilators, oxygen;   3. New CHF, systolic HF ? Sepsis induced; trop neg;  -echo: LVEF 45%, mild dilated LV, diffuse hypokinesia;  Obtain ECG; d/w cardiology; who kindly agreed to f/u   -start lasix daily; replace lytes;   4. R foot abscess; s/p I&D +GROUP A STREP (S.PYOGENES)  -cont IV atx; blood, urine c/s NTD   5. Anemia likely dilutional; I/O + 10. L; no s/sof acute bleeding;  -cont monitor;   Code Status: full Family Communication: husband at the bedside (indicate person spoken with, relationship, and if by phone, the number) Disposition Plan: home in 24-48 hours    Consultants:  ID, ortho    10/20 Right great toe chilectomy  11/17 Began to experience fever/chills/weakness.  11/18 Presented to ER, found to be hypotensive and tachycardic. XRay of R great toe showed no acute fracture or evidence of osteo. MRI of R great toe showed no evidence of osteo/tenosynovitis/septic joint. Did show dorsal soft tissue abscess, 6cm diameter. Levo started.  11/19 To OR for I&D of right toe/foot  LINES / TUBES:  Right IJ TLC 11/18 >>>  CULTURES:  Blood 11/18 >>>  Urine 11/18 >>> neg  Wound 11/18 >>>  ANTIBIOTICS:  Vanc 11/18 >>> 11/19  Zosyn 11/18 >>> 11/19  Ceftriaxone 11/19 >>>  Zyvox 11/19 >>>   HPI/Subjective: Alert, feeling better    Objective: Filed Vitals:   07/05/13 0608  BP: 111/86  Pulse: 82  Temp: 97.9 F (36.6 C)  Resp: 18    Intake/Output Summary (Last 24 hours) at 07/05/13 0919 Last data filed at 07/04/13 1902  Gross per 24 hour  Intake    130 ml  Output    500 ml  Net   -370 ml   Filed Weights   07/02/13 0500 07/03/13 0500 07/04/13 0556  Weight: 78.8 kg (173 lb 11.6 oz) 77.9 kg (171 lb 11.8 oz) 74.1 kg (163 lb 5.8 oz)    Exam:   General:  alert  Cardiovascular: S1, S2 RRR  Respiratory: LL crackles   Abdomen: soft, nt ,nd   Musculoskeletal: surgical wound dressed    Data Reviewed: Basic Metabolic Panel:  Recent Labs Lab 07/01/13 0549 07/01/13 1300 07/01/13 1734 07/02/13 0415 07/03/13 0500 07/04/13 0714  NA 139  --  138 139 137 139  K 4.3  --  3.9 3.7 3.9 3.3*  CL 102  --  107 109 109 104  CO2  --   --  18* 19 22 26   GLUCOSE 120*  --  129* 132* 94 103*  BUN 18  --  16 16 11 10   CREATININE 1.00 1.03 1.06 0.95 0.70 0.72  CALCIUM  --   --  6.8* 6.4* 7.5* 7.8*  MG  --   --  1.0* 2.1  --   --   PHOS  --   --  3.2 3.1  --   --  Liver Function Tests:  Recent Labs Lab 07/01/13 1734  AST 18  ALT 17  ALKPHOS 26*  BILITOT 0.4  PROT 4.9*  ALBUMIN 2.5*   No results found for this basename: LIPASE, AMYLASE,  in the last 168 hours No results found for this basename: AMMONIA,  in the last 168 hours CBC:  Recent Labs Lab 07/01/13 0540  07/01/13 1300 07/01/13 1734 07/02/13 0415 07/03/13 0500 07/04/13 0714  WBC 11.0*  --  16.8* 23.7* 24.2* 15.1* 10.4  NEUTROABS 10.5*  --   --  22.8*  --   --   --   HGB 12.4  < > 9.7* 10.4* 9.7* 9.1* 9.3*  HCT 36.6  < > 28.8* 29.3* 28.0* 27.0* 26.3*  MCV 88.8  --  88.1 87.5 88.9 88.2 85.9  PLT 228  --  179 191 167 137* 150  < > = values in this interval not displayed. Cardiac Enzymes:  Recent Labs Lab 07/01/13 1734 07/02/13 0415 07/02/13 0945  TROPONINI <0.30 <0.30 <0.30   BNP (last 3 results) No results found for this  basename: PROBNP,  in the last 8760 hours CBG:  Recent Labs Lab 07/01/13 1213 07/01/13 1948 07/02/13 1212  GLUCAP 104* 113* 94    Recent Results (from the past 240 hour(s))  WOUND CULTURE     Status: None   Collection Time    07/01/13  5:59 AM      Result Value Range Status   Specimen Description WOUND RIGHT FOOT   Final   Special Requests RIGHT FOOT GREAT TOE   Final   Gram Stain     Final   Value: MODERATE WBC PRESENT, PREDOMINANTLY PMN     NO SQUAMOUS EPITHELIAL CELLS SEEN     ABUNDANT GRAM POSITIVE COCCI IN PAIRS     Performed at [facility]   Culture     Final   Value: ABUNDANT GROUP A STREP (S.PYOGENES) ISOLATED     Performed at [facility]   Report Status 07/03/2013 FINAL   Final  CULTURE, BLOOD (ROUTINE X 2)     Status: None   Collection Time    07/01/13 10:00 AM      Result Value Range Status   Specimen Description BLOOD LEFT ANTECUBITAL   Final   Special Requests BOTTLES DRAWN AEROBIC ONLY 10CC   Final   Culture  Setup Time     Final   Value: 07/01/2013 15:03     Performed at [facility]   Culture     Final   Value:        BLOOD CULTURE RECEIVED NO GROWTH TO DATE CULTURE WILL BE HELD FOR 5 DAYS BEFORE ISSUING A FINAL NEGATIVE REPORT     Performed at [facility]   Report Status PENDING   Incomplete  CULTURE, BLOOD (ROUTINE X 2)     Status: None   Collection Time    07/01/13 10:10 AM      Result Value Range Status   Specimen Description BLOOD ARM LEFT   Final   Special Requests BOTTLES DRAWN AEROBIC AND ANAEROBIC 10CC   Final   Culture  Setup Time     Final   Value: 07/01/2013 15:04     Performed at [facility]   Culture     Final   Value:        BLOOD CULTURE RECEIVED NO GROWTH TO DATE CULTURE WILL BE HELD FOR 5 DAYS BEFORE ISSUING A FINAL NEGATIVE  REPORT     Performed at [facility]   Report Status PENDING   Incomplete  URINE CULTURE     Status: None   Collection Time    07/01/13 12:13 PM       Result Value Range Status   Specimen Description URINE, CLEAN CATCH   Final   Special Requests NONE   Final   Culture  Setup Time     Final   Value: 07/01/2013 18:10     Performed at [facility] Count     Final   Value: NO GROWTH     Performed at [facility]   Culture     Final   Value: NO GROWTH     Performed at [facility]   Report Status 07/02/2013 FINAL   Final  MRSA PCR SCREENING     Status: None   Collection Time    07/01/13 12:14 PM      Result Value Range Status   MRSA by PCR NEGATIVE  NEGATIVE Final   Comment:            The GeneXpert MRSA Assay (FDA     approved for NASAL specimens     only), is one component of a     comprehensive MRSA colonization     surveillance program. It is not     intended to diagnose MRSA     infection nor to guide or     monitor treatment for     MRSA infections.     Studies: Dg Chest 2 View  07/04/2013   CLINICAL DATA:  Rule out neoplasm.  EXAM: CHEST  2 VIEW  COMPARISON:  Chest x-ray from the same day at 5:56 a.m.  FINDINGS: Slight improvement in pulmonary edema, with still patchy perihilar predominant interstitial and airspace opacities. There may be small pleural effusions. No change in heart size.  IMPRESSION: 1. Improved pulmonary edema. 2. Cannot exclude pulmonary nodules until after there has been complete clearance of edema.   Electronically Signed   By: Tiburcio Pea M.D.   On: 07/04/2013 05:24   Mr Foot Right W Wo Contrast  07/03/2013   ADDENDUM REPORT: 07/03/2013 16:11  ADDENDUM: Correction:  Impression #1 should read, " "Increased cellulitis and infiltrating abscess in the subcutaneous fat of the dorsum of the RIGHT foot."   Electronically Signed   By: Geanie Cooley M.D.   On: 07/03/2013 16:11   07/03/2013   CLINICAL DATA:  Septic shock.  Right foot infection.  EXAM: MRI OF THE RIGHT FOREFOOT WITHOUT AND WITH CONTRAST  TECHNIQUE: Multiplanar, multisequence MR imaging was performed both before  and after administration of intravenous contrast.  CONTRAST:  15mL MULTIHANCE GADOBENATE DIMEGLUMINE 529 MG/ML IV SOLN  COMPARISON:  Radiographs dated 07/02/2013 and MRI dated 07/01/2013  FINDINGS: Since the prior examination the patient has developed increased abnormal fluid in the subcutaneous fat of the dorsum of the foot with adjacent edema surrounding these more abnormal fluid collections. This is consistent with extension of the soft tissue abscess more proximally than on the prior study. The abnormal fluid extends across the dorsum of the foot particularly more laterally than previously and extends at least to the level of the distal talus. The more proximal foot is not visible on this exam.  Again noted are postsurgical changes at the 1st metatarsophalangeal joint. The edema in the head of the 1st metatarsal is consistent with a prior surgery and is not suggestive  of osteomyelitis. There is no fluid in the 1st metatarsal phalangeal joint is suggested septic joint.  IMPRESSION: 1. Increased cellulitis and infiltrating abscess in the subcutaneous fat of the dorsum of the left foot. 2. Stable postsurgical changes of the 1st metatarsal phalangeal joint.  Electronically Signed: By: Geanie Cooley M.D. On: 07/03/2013 14:23   Dg Chest Port 1 View  07/04/2013   CLINICAL DATA:  Shortness of breath, increased heart rate with ambulation, postprocedure  EXAM: PORTABLE CHEST - 1 VIEW  COMPARISON:  Portable exam 0723 hr compared to 07/03/2013  FINDINGS: Normal heart size and mediastinal contours for technique.  Interval improvement of bilateral diffuse pulmonary infiltrates question edema though infection is not excluded.  Atelectasis and probable small effusion at left lung base, though cannot exclude underlying infiltrate or nodule.  No pneumothorax.  Prior cervical spine fusion.  IMPRESSION: Interval improvement of diffuse bilateral pulmonary infiltrates question edema versus infection.  Persistent atelectasis and  question small effusion at left lung base.   Electronically Signed   By: Ulyses Southward M.D.   On: 07/04/2013 07:52    Scheduled Meds: . heparin  5,000 Units Subcutaneous Q8H  . linezolid  600 mg Intravenous Q12H   Continuous Infusions:    Principal Problem:   SIRS (systemic inflammatory response syndrome) Active Problems:   Right foot infection   Hypotension   Tachycardia   Septic shock    Time spent: >35 minutes     Esperanza Sheets  [facility] Pager [phone]. If 7PM-7AM, please contact night-coverage at [email or web address], password [facility] 07/05/2013, 9:19 AM  LOS: 4 days

## 2013-07-06 DIAGNOSIS — I519 Heart disease, unspecified: Secondary | ICD-10-CM

## 2013-07-06 DIAGNOSIS — I319 Disease of pericardium, unspecified: Secondary | ICD-10-CM

## 2013-07-06 DIAGNOSIS — J90 Pleural effusion, not elsewhere classified: Secondary | ICD-10-CM

## 2013-07-06 LAB — CBC
MCH: 30.1 pg (ref 26.0–34.0)
MCV: 88.5 fL (ref 78.0–100.0)
Platelets: 266 10*3/uL (ref 150–400)
RDW: 13.6 % (ref 11.5–15.5)
WBC: 8.2 10*3/uL (ref 4.0–10.5)

## 2013-07-06 LAB — BASIC METABOLIC PANEL
CO2: 30 mEq/L (ref 19–32)
Calcium: 8 mg/dL — ABNORMAL LOW (ref 8.4–10.5)
Creatinine, Ser: 0.6 mg/dL (ref 0.50–1.10)
GFR calc Af Amer: >90 mL/min (ref >90)

## 2013-07-06 MED ORDER — POTASSIUM CHLORIDE CRYS ER 20 MEQ PO TBCR
60.0000 meq | EXTENDED_RELEASE_TABLET | Freq: Two times a day (BID) | ORAL | Status: DC
  Administered 2013-07-06 – 2013-07-08 (×4): 60 meq via ORAL
  Filled 2013-07-06: qty 6
  Filled 2013-07-06 (×6): qty 3

## 2013-07-06 NOTE — Progress Notes (Addendum)
[facility]     Day # 5 zyvox   Subjective: concerned re color changes of skin and one of the more proximal lesions    Antibiotics:  Anti-infectives   Start     Dose/Rate Route Frequency Ordered Stop   07/05/13 1000  linezolid (ZYVOX) tablet 600 mg     600 mg Oral Every 12 hours 07/05/13 0955     07/02/13 2200  linezolid (ZYVOX) IVPB 600 mg  Status:  Discontinued     600 mg 300 mL/hr over 60 Minutes Intravenous Every 12 hours 07/02/13 1602 07/05/13 0955   07/02/13 1615  cefTRIAXone (ROCEPHIN) 2 g in dextrose 5 % 50 mL IVPB  Status:  Discontinued     2 g 100 mL/hr over 30 Minutes Intravenous Every 24 hours 07/02/13 1602 07/04/13 1308   07/01/13 2200  [MAR Hold]  piperacillin-tazobactam (ZOSYN) IVPB 3.375 g  Status:  Discontinued     (On MAR Hold since 07/02/13 1304)   3.375 g 12.5 mL/hr over 240 Minutes Intravenous 3 times per day 07/01/13 1147 07/02/13 1602   07/01/13 1400  [MAR Hold]  vancomycin (VANCOCIN) IVPB 750 mg/150 ml premix  Status:  Discontinued     (On MAR Hold since 07/02/13 1304)   750 mg 150 mL/hr over 60 Minutes Intravenous Every 8 hours 07/01/13 1147 07/02/13 1602   07/01/13 1130  piperacillin-tazobactam (ZOSYN) IVPB 3.375 g     3.375 g 100 mL/hr over 30 Minutes Intravenous STAT 07/01/13 1118 07/01/13 1217   07/01/13 0545  vancomycin (VANCOCIN) IVPB 1000 mg/200 mL premix     1,000 mg 200 mL/hr over 60 Minutes Intravenous  Once 07/01/13 0542 07/01/13 0649      Medications: Scheduled Meds: . furosemide  20 mg Intravenous Q breakfast  . heparin  5,000 Units Subcutaneous Q8H  . ibuprofen  600 mg Oral TID  . linezolid  600 mg Oral Q12H  . potassium chloride  60 mEq Oral BID   Continuous Infusions:  PRN Meds:.sodium chloride, acetaminophen, fentaNYL, ibuprofen, influenza vac split quadrivalent PF, MUSCLE RUB, ondansetron (ZOFRAN) IV   Objective: Weight change:   Intake/Output Summary (Last 24 hours) at 07/06/13 1827 Last  data filed at 07/06/13 1300  Gross per 24 hour  Intake    240 ml  Output      0 ml  Net    240 ml   Blood pressure 109/70, pulse 74, temperature 98.1 F (36.7 C), temperature source Oral, resp. rate 18, height 5\' 6"  (1.676 m), weight 163 lb 5.8 oz (74.1 kg), last menstrual period 07/04/2013, SpO2 100.00%. Temp:  [98 F (36.7 C)-98.1 F (36.7 C)] 98.1 F (36.7 C) (11/23 1415) Pulse Rate:  [72-74] 74 (11/23 1415) Resp:  [18] 18 (11/23 1415) BP: (109-120)/(70-82) 109/70 mmHg (11/23 1415) SpO2:  [100 %] 100 % (11/23 1415)  Physical Exam: General: Alert and awake, oriented x3, flushed  HEENT: anicteric sclera, pupils reactive to light and accommodation, EOMI, oropharynx with "strawberry tongue"better  CVS regular rate, normal r,  no murmur rubs or gallops Chest: no wheezing, rales or rhonchi Abdomen:  nondistended, normal bowel sounds,  EXt: right foot with sutures in place, see picture, bullous lesions are almost all coalescing becoming more purple dark red ? Small amount of blood into lesions.   See sequential pictures below:  November 18TH:        November 21ST            November 22ND  November 23RD:                  Neuro: nonfocal  Lab Results:  Recent Labs  07/04/13 0714 07/06/13 1715  WBC 10.4 8.2  HGB 9.3* 10.2*  HCT 26.3* 30.0*  PLT 150 266    BMET  Recent Labs  07/05/13 1020 07/06/13 0710  NA 139 139  K 2.9* 3.6  CL 103 103  CO2 28 30  GLUCOSE 117* 94  BUN 9 10  CREATININE 0.64 0.60  CALCIUM 8.0* 8.0*    Micro Results: Recent Results (from the past 240 hour(s))  WOUND CULTURE     Status: None   Collection Time    07/01/13  5:59 AM      Result Value Range Status   Specimen Description WOUND RIGHT FOOT   Final   Special Requests RIGHT FOOT GREAT TOE   Final   Gram Stain     Final   Value: MODERATE WBC PRESENT, PREDOMINANTLY PMN     NO SQUAMOUS EPITHELIAL CELLS SEEN     ABUNDANT GRAM  POSITIVE COCCI IN PAIRS     Performed at [facility]   Culture     Final   Value: ABUNDANT GROUP A STREP (S.PYOGENES) ISOLATED     Performed at [facility]   Report Status 07/03/2013 FINAL   Final  CULTURE, BLOOD (ROUTINE X 2)     Status: None   Collection Time    07/01/13 10:00 AM      Result Value Range Status   Specimen Description BLOOD LEFT ANTECUBITAL   Final   Special Requests BOTTLES DRAWN AEROBIC ONLY 10CC   Final   Culture  Setup Time     Final   Value: 07/01/2013 15:03     Performed at [facility]   Culture     Final   Value:        BLOOD CULTURE RECEIVED NO GROWTH TO DATE CULTURE WILL BE HELD FOR 5 DAYS BEFORE ISSUING A FINAL NEGATIVE REPORT     Performed at [facility]   Report Status PENDING   Incomplete  CULTURE, BLOOD (ROUTINE X 2)     Status: None   Collection Time    07/01/13 10:10 AM      Result Value Range Status   Specimen Description BLOOD ARM LEFT   Final   Special Requests BOTTLES DRAWN AEROBIC AND ANAEROBIC 10CC   Final   Culture  Setup Time     Final   Value: 07/01/2013 15:04     Performed at [facility]   Culture     Final   Value:        BLOOD CULTURE RECEIVED NO GROWTH TO DATE CULTURE WILL BE HELD FOR 5 DAYS BEFORE ISSUING A FINAL NEGATIVE REPORT     Performed at [facility]   Report Status PENDING   Incomplete  URINE CULTURE     Status: None   Collection Time    07/01/13 12:13 PM      Result Value Range Status   Specimen Description URINE, CLEAN CATCH   Final   Special Requests NONE   Final   Culture  Setup Time     Final   Value: 07/01/2013 18:10     Performed at [facility] Count     Final   Value: NO GROWTH     Performed at [facility]  Final   Value: NO GROWTH     Performed at [facility]   Report Status 07/02/2013 FINAL   Final  MRSA PCR SCREENING     Status: None   Collection Time    07/01/13 12:14 PM      Result Value Range  Status   MRSA by PCR NEGATIVE  NEGATIVE Final   Comment:            The GeneXpert MRSA Assay (FDA     approved for NASAL specimens     only), is one component of a     comprehensive MRSA colonization     surveillance program. It is not     intended to diagnose MRSA     infection nor to guide or     monitor treatment for     MRSA infections.    Studies/Results: No results found.    Assessment/Plan: Shelly [name] is a 43 y.o. female with   with foot abscess with GAS abscess in foot with apparent Toxic shock due to Group A Strep:   #1 GAS infection with toxic shock: Her bullous lesions on her foot are coalescing . She has one additional lesion that is more proximal  I am skeptical that these new areas represent actual infection or abscess but rather evolution of her reaction to toxic shock  One could certainly ask a Dermatologist to see her (if they conould be convinced to come into the hospital or at minimum review the images)   --continue  ORALzyvox alone  --Elevate foot with 3-4 pillows  --I would like for the pt to have 10-14 days postoperative antibiotics  This could be with oral zyvox IF her insurance will cover this  OR it could be with   Oral clindamycin 450mg  QID  I will add b6   Check cbc daily while in house and on zyvox  #2 Small pericardial effusion and pleural effusion with mild LV systolic dysfunction. I agree with Dr Anne Fu that this would again seem to be part of her TOxin mediated SIRS  --would recheck CXR 2 view tomorrow and LEft lateral decubitus   LOS: 5 days   Acey Lav 07/06/2013, 6:27 PM

## 2013-07-06 NOTE — Progress Notes (Signed)
[facility] PROGRESS NOTE  Shelly [name] UJW:[id number] DOB: 06-Jan-1971 DOA: 07/01/2013 PCP: Hannah Beat, MD  Brief summary. 42 y.o. F who underwent right great toe chilectomy 1 month ago (Dr. Eulah Pont). Had stitches removed post op week 2 - 3, then wound began to dehisce. On 11/17, pt began to have fever/chills and generalized weakness/not feeling well. Came to ER 11/18, found to have Sepsis/hypotension  Assessment/Plan:  1. S/p septic shock; toxic shock with strep A abscess; off pressors; improved   2. Possible pneumonia BL; CXR: Development of diffuse interstitial and airspace densities -clinically better; cont IV atx, bronchodilators, oxygen;   3. New CHF, systolic HF ? Sepsis induced; trop neg; possible pericarditis  -echo: LVEF 45%, mild dilated LV, diffuse hypokinesia;   -cont lasix daily; replace lytes; NSAID for possible pericarditis per cardiology; appreciate cardiology input   4. R foot abscess; s/p I&D +GROUP A STREP (S.PYOGENES)  -cont atx; blood, urine c/s NTD; appreciate ID input; need atx 10-14 days post op; zyvox or clindamycin   5. Anemia likely dilutional; I/O + 10. L; no s/sof acute bleeding;  -cont monitor;   Code Status: full Family Communication: husband at the bedside (indicate person spoken with, relationship, and if by phone, the number) Disposition Plan: home in 24-48 hours    Consultants:  ID, ortho    10/20 Right great toe chilectomy  11/17 Began to experience fever/chills/weakness.  11/18 Presented to ER, found to be hypotensive and tachycardic. XRay of R great toe showed no acute fracture or evidence of osteo. MRI of R great toe showed no evidence of osteo/tenosynovitis/septic joint. Did show dorsal soft tissue abscess, 6cm diameter. Levo started.  11/19 To OR for I&D of right toe/foot  LINES / TUBES:  Right IJ TLC 11/18 >>>  CULTURES:  Blood 11/18 >>>  Urine 11/18 >>> neg  Wound 11/18 >>>  ANTIBIOTICS:  Vanc 11/18 >>> 11/19  Zosyn  11/18 >>> 11/19  Ceftriaxone 11/19 >>>  Zyvox 11/19 >>>   HPI/Subjective: Alert, feeling better   Objective: Filed Vitals:   07/05/13 2134  BP: 120/82  Pulse: 72  Temp: 98 F (36.7 C)  Resp: 18    Intake/Output Summary (Last 24 hours) at 07/06/13 1001 Last data filed at 07/05/13 1500  Gross per 24 hour  Intake    240 ml  Output   1600 ml  Net  -1360 ml   Filed Weights   07/02/13 0500 07/03/13 0500 07/04/13 0556  Weight: 78.8 kg (173 lb 11.6 oz) 77.9 kg (171 lb 11.8 oz) 74.1 kg (163 lb 5.8 oz)    Exam:   General:  alert  Cardiovascular: S1, S2 RRR  Respiratory: LL crackles   Abdomen: soft, nt ,nd   Musculoskeletal: surgical wound dressed    Data Reviewed: Basic Metabolic Panel:  Recent Labs Lab 07/01/13 0549  07/01/13 1734 07/02/13 0415 07/03/13 0500 07/04/13 0714 07/05/13 1020 07/06/13 0710  NA 139  --  138 139 137 139 139 139  K 4.3  --  3.9 3.7 3.9 3.3* 2.9* 3.6  CL 102  --  107 109 109 104 103 103  CO2  --   < > 18* 19 22 26 28 30   GLUCOSE 120*  --  129* 132* 94 103* 117* 94  BUN 18  --  16 16 11 10 9 10   CREATININE 1.00  < > 1.06 0.95 0.70 0.72 0.64 0.60  CALCIUM  --   < > 6.8* 6.4* 7.5* 7.8* 8.0* 8.0*  MG  --   --  1.0* 2.1  --   --   --   --   PHOS  --   --  3.2 3.1  --   --   --   --   < > = values in this interval not displayed. Liver Function Tests:  Recent Labs Lab 07/01/13 1734  AST 18  ALT 17  ALKPHOS 26*  BILITOT 0.4  PROT 4.9*  ALBUMIN 2.5*   No results found for this basename: LIPASE, AMYLASE,  in the last 168 hours No results found for this basename: AMMONIA,  in the last 168 hours CBC:  Recent Labs Lab 07/01/13 0540  07/01/13 1300 07/01/13 1734 07/02/13 0415 07/03/13 0500 07/04/13 0714  WBC 11.0*  --  16.8* 23.7* 24.2* 15.1* 10.4  NEUTROABS 10.5*  --   --  22.8*  --   --   --   HGB 12.4  < > 9.7* 10.4* 9.7* 9.1* 9.3*  HCT 36.6  < > 28.8* 29.3* 28.0* 27.0* 26.3*  MCV 88.8  --  88.1 87.5 88.9 88.2 85.9  PLT  228  --  179 191 167 137* 150  < > = values in this interval not displayed. Cardiac Enzymes:  Recent Labs Lab 07/01/13 1734 07/02/13 0415 07/02/13 0945  TROPONINI <0.30 <0.30 <0.30   BNP (last 3 results) No results found for this basename: PROBNP,  in the last 8760 hours CBG:  Recent Labs Lab 07/01/13 1213 07/01/13 1948 07/02/13 1212  GLUCAP 104* 113* 94    Recent Results (from the past 240 hour(s))  WOUND CULTURE     Status: None   Collection Time    07/01/13  5:59 AM      Result Value Range Status   Specimen Description WOUND RIGHT FOOT   Final   Special Requests RIGHT FOOT GREAT TOE   Final   Gram Stain     Final   Value: MODERATE WBC PRESENT, PREDOMINANTLY PMN     NO SQUAMOUS EPITHELIAL CELLS SEEN     ABUNDANT GRAM POSITIVE COCCI IN PAIRS     Performed at [facility]   Culture     Final   Value: ABUNDANT GROUP A STREP (S.PYOGENES) ISOLATED     Performed at [facility]   Report Status 07/03/2013 FINAL   Final  CULTURE, BLOOD (ROUTINE X 2)     Status: None   Collection Time    07/01/13 10:00 AM      Result Value Range Status   Specimen Description BLOOD LEFT ANTECUBITAL   Final   Special Requests BOTTLES DRAWN AEROBIC ONLY 10CC   Final   Culture  Setup Time     Final   Value: 07/01/2013 15:03     Performed at [facility]   Culture     Final   Value:        BLOOD CULTURE RECEIVED NO GROWTH TO DATE CULTURE WILL BE HELD FOR 5 DAYS BEFORE ISSUING A FINAL NEGATIVE REPORT     Performed at [facility]   Report Status PENDING   Incomplete  CULTURE, BLOOD (ROUTINE X 2)     Status: None   Collection Time    07/01/13 10:10 AM      Result Value Range Status   Specimen Description BLOOD ARM LEFT   Final   Special Requests BOTTLES DRAWN AEROBIC AND ANAEROBIC 10CC   Final   Culture  Setup Time  Final   Value: 07/01/2013 15:04     Performed at [facility]   Culture     Final   Value:        BLOOD CULTURE RECEIVED NO  GROWTH TO DATE CULTURE WILL BE HELD FOR 5 DAYS BEFORE ISSUING A FINAL NEGATIVE REPORT     Performed at [facility]   Report Status PENDING   Incomplete  URINE CULTURE     Status: None   Collection Time    07/01/13 12:13 PM      Result Value Range Status   Specimen Description URINE, CLEAN CATCH   Final   Special Requests NONE   Final   Culture  Setup Time     Final   Value: 07/01/2013 18:10     Performed at [facility] Count     Final   Value: NO GROWTH     Performed at [facility]   Culture     Final   Value: NO GROWTH     Performed at [facility]   Report Status 07/02/2013 FINAL   Final  MRSA PCR SCREENING     Status: None   Collection Time    07/01/13 12:14 PM      Result Value Range Status   MRSA by PCR NEGATIVE  NEGATIVE Final   Comment:            The GeneXpert MRSA Assay (FDA     approved for NASAL specimens     only), is one component of a     comprehensive MRSA colonization     surveillance program. It is not     intended to diagnose MRSA     infection nor to guide or     monitor treatment for     MRSA infections.     Studies: No results found.  Scheduled Meds: . furosemide  20 mg Intravenous Q breakfast  . heparin  5,000 Units Subcutaneous Q8H  . ibuprofen  600 mg Oral TID  . linezolid  600 mg Oral Q12H  . potassium chloride  60 mEq Oral BID   Continuous Infusions:    Principal Problem:   SIRS (systemic inflammatory response syndrome) Active Problems:   Right foot infection   Hypotension   Tachycardia   Septic shock    Time spent: >35 minutes     Esperanza Sheets  [facility] Pager [phone]. If 7PM-7AM, please contact night-coverage at [email or web address], password [facility] 07/06/2013, 10:01 AM  LOS: 5 days

## 2013-07-06 NOTE — Progress Notes (Signed)
SPORTS MEDICINE AND JOINT REPLACEMENT  Shelly Spurling, MD   Altamese Cabal, PA-C [address] [place], [place], [place]  [address]                             [phone]   PROGRESS NOTE  Subjective:   Chest Pain resolving  negative for Shortness of Breath  negative for Nausea/Vomiting   negative for Calf Pain  negative for Bowel Movement   Tolerating Diet: yes         Patient reports pain as 3 on 0-10 scale.    Objective: Vital signs in last 24 hours:   Patient Vitals for the past 24 hrs:  BP Temp Temp src Pulse Resp SpO2  07/05/13 2134 120/82 mmHg 98 F (36.7 C) Oral 72 18 100 %  07/05/13 1400 114/75 mmHg 98 F (36.7 C) Oral 78 18 100 %    @flow {1959:LAST@   Intake/Output from previous day:   11/22 0701 - 11/23 0700 In: 240 [P.O.:240] Out: 1600 [Urine:1600]   Intake/Output this shift:       Intake/Output     11/22 0701 - 11/23 0700 11/23 0701 - 11/24 0700   P.O. 240    Total Intake(mL/kg) 240 (3.2)    Urine (mL/kg/hr) 1600 (0.9)    Total Output 1600     Net -1360             LABORATORY DATA:  Recent Labs  07/01/13 0540 07/01/13 0549 07/01/13 1300 07/01/13 1734 07/02/13 0415 07/03/13 0500 07/04/13 0714  WBC 11.0*  --  16.8* 23.7* 24.2* 15.1* 10.4  HGB 12.4 12.6 9.7* 10.4* 9.7* 9.1* 9.3*  HCT 36.6 37.0 28.8* 29.3* 28.0* 27.0* 26.3*  PLT 228  --  179 191 167 137* 150    Recent Labs  07/01/13 0549 07/01/13 1300 07/01/13 1734 07/02/13 0415 07/03/13 0500 07/04/13 0714 07/05/13 1020 07/06/13 0710  NA 139  --  138 139 137 139 139 139  K 4.3  --  3.9 3.7 3.9 3.3* 2.9* 3.6  CL 102  --  107 109 109 104 103 103  CO2  --   --  18* 19 22 26 28 30   BUN 18  --  16 16 11 10 9 10   CREATININE 1.00 1.03 1.06 0.95 0.70 0.72 0.64 0.60  GLUCOSE 120*  --  129* 132* 94 103* 117* 94  CALCIUM  --   --  6.8* 6.4* 7.5* 7.8* 8.0* 8.0*   No results found for this basename: INR, PROTIME    Examination:  General appearance: alert, cooperative and no  distress Extremities: Homans sign is negative, no sign of DVT  Wound Exam: clean, dry, intact   Drainage:  None: wound tissue dry  Motor Exam: EHL and FHL Intact  Sensory Exam: Deep Peroneal normal   Assessment:    4 Days Post-Op  Procedure(s) (LRB): IRRIGATION AND DEBRIDEMENT RIGHT 1ST Metaphalangeal JOINT (Right)  ADDITIONAL DIAGNOSIS:  Principal Problem:   SIRS (systemic inflammatory response syndrome) Active Problems:   Right foot infection   Hypotension   Tachycardia   Septic shock     Plan: Continue ABX therapy due to Post-op infection           Shelly [name] 07/06/2013, 10:45 AM

## 2013-07-06 NOTE — Progress Notes (Signed)
    Subjective:  Improving. Pleuritic pain improved.   Objective:  Vital Signs in the last 24 hours: Temp:  [98 F (36.7 C)] 98 F (36.7 C) (11/22 2134) Pulse Rate:  [72-78] 72 (11/22 2134) Resp:  [18] 18 (11/22 2134) BP: (114-120)/(75-82) 120/82 mmHg (11/22 2134) SpO2:  [100 %] 100 % (11/22 2134)  Intake/Output from previous day: 11/22 0701 - 11/23 0700 In: 240 [P.O.:240] Out: 1600 [Urine:1600]   Physical Exam: General: Well developed, well nourished, in no acute distress. Head:  Normocephalic and atraumatic. Lungs: Clear to auscultation and percussion. Heart: Normal S1 and S2.  No murmur,no rub today no gallops.  Abdomen: soft, non-tender, positive bowel sounds. Extremities: Left leg wrapped. Neurologic: Alert and oriented x 3.    Lab Results:  Recent Labs  07/04/13 0714  WBC 10.4  HGB 9.3*  PLT 150    Recent Labs  07/05/13 1020 07/06/13 0710  NA 139 139  K 2.9* 3.6  CL 103 103  CO2 28 30  GLUCOSE 117* 94  BUN 9 10  CREATININE 0.64 0.60   Telemetry: NSR Personally viewed.   Assessment/Plan:  Principal Problem:   SIRS (systemic inflammatory response syndrome) Active Problems:   Right foot infection   Hypotension   Tachycardia   Septic shock   1) Mild left ventricular systolic dysfunction  - likely related to SIRS  - should continue to improve with time  2) Pericarditis/pleuritis  - effusion left pleural   - Ibuprofen 600 TID for 7 days total  - Feel less positional pain.   - No rub today.   Will see back in clinic in about a month.   Please call if any questions.   Shelly [name] 07/06/2013, 11:23 AM

## 2013-07-07 LAB — BASIC METABOLIC PANEL
Calcium: 8.2 mg/dL — ABNORMAL LOW (ref 8.4–10.5)
Chloride: 102 mEq/L (ref 96–112)
Creatinine, Ser: 0.71 mg/dL (ref 0.50–1.10)
GFR calc Af Amer: >90 mL/min (ref >90)
Potassium: 2.9 mEq/L — ABNORMAL LOW (ref 3.5–5.1)

## 2013-07-07 LAB — CULTURE, BLOOD (ROUTINE X 2): Culture: NO GROWTH

## 2013-07-07 LAB — CBC
HCT: 30.1 % — ABNORMAL LOW (ref 36.0–46.0)
Hemoglobin: 10.1 g/dL — ABNORMAL LOW (ref 12.0–15.0)
Platelets: 307 10*3/uL (ref 150–400)
RDW: 13.5 % (ref 11.5–15.5)
WBC: 8.8 10*3/uL (ref 4.0–10.5)

## 2013-07-07 MED ORDER — MAGNESIUM SULFATE 40 MG/ML IJ SOLN
2.0000 g | Freq: Once | INTRAMUSCULAR | Status: AC
  Administered 2013-07-07: 2 g via INTRAVENOUS
  Filled 2013-07-07: qty 50

## 2013-07-07 MED ORDER — POTASSIUM CHLORIDE CRYS ER 20 MEQ PO TBCR
40.0000 meq | EXTENDED_RELEASE_TABLET | Freq: Once | ORAL | Status: AC
  Administered 2013-07-07: 40 meq via ORAL
  Filled 2013-07-07: qty 2

## 2013-07-07 NOTE — Care Management Note (Signed)
  Page 1 of 1   07/07/2013     10:56:36 AM   CARE MANAGEMENT NOTE 07/07/2013  Patient:  Shelly [name], Shelly [name]   Account Number:  [id number]  Date Initiated:  07/01/2013  Documentation initiated by:  Avie Arenas  Subjective/Objective Assessment:   Admitted with sepsis.     Action/Plan:   Anticipated DC Date:  07/04/2013   Anticipated DC Plan:  HOME/SELF CARE      DC Planning Services  CM consult      Choice offered to / List presented to:             Status of service:  In process, will continue to follow Medicare Important Message given?   (If response is "NO", the following Medicare IM given date fields will be blank) Date Medicare IM given:   Date Additional Medicare IM given:    Discharge Disposition:    Per UR Regulation:  Reviewed for med. necessity/level of care/duration of stay  If discussed at Long Length of Stay Meetings, dates discussed:    Comments:  Contact:  Perra,Matthew Other [phone]  07-07-13 Patient on Zyvox 600 mg PO Q 12 hours needs 10 to 14 days post op , patient has prescription coverage through [facility] [employer] [facility] . Total cost of medicine before insurance is $3300 , patient's co pay is 1330.35 .  Called Zyvox assistance program patient is eligible ( with her [employer] / [employer] insurance) , program will pay $1000 of her co pay , patient will be responsible for roughly $330.35 . Patient stated that she can afford $330.35 . Zyvox assistance card given to patient explained she needs to call to activate .  Ronny Flurry RN BSN [phone]

## 2013-07-07 NOTE — Progress Notes (Signed)
[facility]   Day # 6 zyvox      Antibiotics:  Anti-infectives   Start     Dose/Rate Route Frequency Ordered Stop   07/05/13 1000  linezolid (ZYVOX) tablet 600 mg     600 mg Oral Every 12 hours 07/05/13 0955     07/02/13 2200  linezolid (ZYVOX) IVPB 600 mg  Status:  Discontinued     600 mg 300 mL/hr over 60 Minutes Intravenous Every 12 hours 07/02/13 1602 07/05/13 0955   07/02/13 1615  cefTRIAXone (ROCEPHIN) 2 g in dextrose 5 % 50 mL IVPB  Status:  Discontinued     2 g 100 mL/hr over 30 Minutes Intravenous Every 24 hours 07/02/13 1602 07/04/13 1308   07/01/13 2200  [MAR Hold]  piperacillin-tazobactam (ZOSYN) IVPB 3.375 g  Status:  Discontinued     (On MAR Hold since 07/02/13 1304)   3.375 g 12.5 mL/hr over 240 Minutes Intravenous 3 times per day 07/01/13 1147 07/02/13 1602   07/01/13 1400  [MAR Hold]  vancomycin (VANCOCIN) IVPB 750 mg/150 ml premix  Status:  Discontinued     (On MAR Hold since 07/02/13 1304)   750 mg 150 mL/hr over 60 Minutes Intravenous Every 8 hours 07/01/13 1147 07/02/13 1602   07/01/13 1130  piperacillin-tazobactam (ZOSYN) IVPB 3.375 g     3.375 g 100 mL/hr over 30 Minutes Intravenous STAT 07/01/13 1118 07/01/13 1217   07/01/13 0545  vancomycin (VANCOCIN) IVPB 1000 mg/200 mL premix     1,000 mg 200 mL/hr over 60 Minutes Intravenous  Once 07/01/13 0542 07/01/13 0649      Medications: Scheduled Meds: . furosemide  20 mg Intravenous Q breakfast  . heparin  5,000 Units Subcutaneous Q8H  . ibuprofen  600 mg Oral TID  . linezolid  600 mg Oral Q12H  . potassium chloride  60 mEq Oral BID   Continuous Infusions:  PRN Meds:.sodium chloride, acetaminophen, fentaNYL, ibuprofen, influenza vac split quadrivalent PF, MUSCLE RUB, ondansetron (ZOFRAN) IV   Objective: Weight change:   Intake/Output Summary (Last 24 hours) at 07/07/13 1215 Last data filed at 07/07/13 1100  Gross per 24 hour  Intake    480 ml  Output      0 ml  Net     480 ml   Blood pressure 121/69, pulse 70, temperature 98.8 F (37.1 C), temperature source Oral, resp. rate 20, height 5\' 6"  (1.676 m), weight 161 lb 3.2 oz (73.12 kg), last menstrual period 07/04/2013, SpO2 97.00%. Temp:  [98.1 F (36.7 C)-98.8 F (37.1 C)] 98.8 F (37.1 C) (11/24 0601) Pulse Rate:  [60-74] 70 (11/24 0601) Resp:  [18-20] 20 (11/24 0601) BP: (109-121)/(69-74) 121/69 mmHg (11/24 0601) SpO2:  [97 %-100 %] 97 % (11/24 0601) Weight:  [161 lb 3.2 oz (73.12 kg)] 161 lb 3.2 oz (73.12 kg) (11/24 0601)  Physical Exam: General: Alert and awake, oriented x3, flushed  HEENT: anicteric sclera, pupils reactive to light and accommodation, EOMI, oropharynx with "strawberry tongue"better  CVS regular rate, normal r,  no murmur rubs or gallops Chest: no wheezing, rales or rhonchi Abdomen:  nondistended, normal bowel sounds,  EXt: right foot with sutures in place, see picture, bullous lesions are almost all coalescing becoming more purple dark red ? Small amount of blood into lesions.    My note yesterday shows sequential images, today just todays images             Neuro: nonfocal  Lab Results:  Recent Labs  07/06/13 1715 07/07/13 0320  WBC 8.2 8.8  HGB 10.2* 10.1*  HCT 30.0* 30.1*  PLT 266 307    BMET  Recent Labs  07/06/13 0710 07/07/13 0320  NA 139 139  K 3.6 2.9*  CL 103 102  CO2 30 27  GLUCOSE 94 100*  BUN 10 17  CREATININE 0.60 0.71  CALCIUM 8.0* 8.2*    Micro Results: Recent Results (from the past 240 hour(s))  WOUND CULTURE     Status: None   Collection Time    07/01/13  5:59 AM      Result Value Range Status   Specimen Description WOUND RIGHT FOOT   Final   Special Requests RIGHT FOOT GREAT TOE   Final   Gram Stain     Final   Value: MODERATE WBC PRESENT, PREDOMINANTLY PMN     NO SQUAMOUS EPITHELIAL CELLS SEEN     ABUNDANT GRAM POSITIVE COCCI IN PAIRS     Performed at [facility]   Culture     Final   Value:  ABUNDANT GROUP A STREP (S.PYOGENES) ISOLATED     Performed at [facility]   Report Status 07/03/2013 FINAL   Final  CULTURE, BLOOD (ROUTINE X 2)     Status: None   Collection Time    07/01/13 10:00 AM      Result Value Range Status   Specimen Description BLOOD LEFT ANTECUBITAL   Final   Special Requests BOTTLES DRAWN AEROBIC ONLY 10CC   Final   Culture  Setup Time     Final   Value: 07/01/2013 15:03     Performed at [facility]   Culture     Final   Value: NO GROWTH 5 DAYS     Performed at [facility]   Report Status 07/07/2013 FINAL   Final  CULTURE, BLOOD (ROUTINE X 2)     Status: None   Collection Time    07/01/13 10:10 AM      Result Value Range Status   Specimen Description BLOOD ARM LEFT   Final   Special Requests BOTTLES DRAWN AEROBIC AND ANAEROBIC 10CC   Final   Culture  Setup Time     Final   Value: 07/01/2013 15:04     Performed at [facility]   Culture     Final   Value: NO GROWTH 5 DAYS     Performed at [facility]   Report Status 07/07/2013 FINAL   Final  URINE CULTURE     Status: None   Collection Time    07/01/13 12:13 PM      Result Value Range Status   Specimen Description URINE, CLEAN CATCH   Final   Special Requests NONE   Final   Culture  Setup Time     Final   Value: 07/01/2013 18:10     Performed at [facility] Count     Final   Value: NO GROWTH     Performed at [facility]   Culture     Final   Value: NO GROWTH     Performed at [facility]   Report Status 07/02/2013 FINAL   Final  MRSA PCR SCREENING     Status: None   Collection Time    07/01/13 12:14 PM      Result Value Range Status   MRSA by PCR NEGATIVE  NEGATIVE Final   Comment:  The GeneXpert MRSA Assay (FDA     approved for NASAL specimens     only), is one component of a     comprehensive MRSA colonization     surveillance program. It is not     intended to diagnose MRSA     infection nor  to guide or     monitor treatment for     MRSA infections.    Studies/Results: No results found.    Assessment/Plan: Shelly [name] is a 42 y.o. female with   with foot abscess with GAS abscess in foot with apparent Toxic shock due to Group A Strep:   #1 GAS infection with toxic shock: Her bullous lesions on her foot are coalescing . She has one additional lesion that is more proximal  I am skeptical that these new areas represent actual infection or abscess but rather evolution of her reaction to toxic shock  One could certainly ask a Dermatologist to see her (if they conould be convinced to come into the hospital or at minimum review the images)   --continue  ORALzyvox alone  --Elevate foot with 3-4 pillows  --I would like for the pt to have 14 days postoperative antibiotics (8 more days)  I understand with zyvox assistance she would have to pay $300, if pt does not want to pay such a large sum of money clindamycin 300 mg po qid would be perfectly acceptable  Check cbc daily while in house and on zyvox  #2 Small pericardial effusion and pleural effusion with mild LV systolic dysfunction. I agree with Dr Anne Fu that this would again seem to be part of her TOxin mediated SIRS  --consider CXR 2 view and LEft lateral decubitus   LOS: 6 days   Acey Lav 07/07/2013, 12:15 PM

## 2013-07-07 NOTE — Progress Notes (Signed)
Subjective: 5 Days Post-Op Procedure(s) (LRB): IRRIGATION AND DEBRIDEMENT RIGHT 1ST Metaphalangeal JOINT (Right) Patient reports pain as 2 on 0-10 scale.  Patient is doing very well.  Intraoperative blisters have coalesced but have rescinded.  Patient denies any fever, chills, nausea, or vomiting.  Denies calf pain or tenderness bilaterally.  Good bowel and bladder function.  She is on a heart-healthy diet and is tolerating it well.  She continues to be monitored by infectious disease who switched her to PO antibiotics for 10-14 days.    Objective: Vital signs in last 24 hours: Temp:  [98.1 F (36.7 C)-98.8 F (37.1 C)] 98.8 F (37.1 C) (11/24 0601) Pulse Rate:  [60-74] 70 (11/24 0601) Resp:  [18-20] 20 (11/24 0601) BP: (109-121)/(69-74) 121/69 mmHg (11/24 0601) SpO2:  [97 %-100 %] 97 % (11/24 0601) Weight:  [73.12 kg (161 lb 3.2 oz)] 73.12 kg (161 lb 3.2 oz) (11/24 0601)  Intake/Output from previous day: 11/23 0701 - 11/24 0700 In: 240 [P.O.:240] Out: -  Intake/Output this shift:     Recent Labs  07/06/13 1715 07/07/13 0320  HGB 10.2* 10.1*    Recent Labs  07/06/13 1715 07/07/13 0320  WBC 8.2 8.8  RBC 3.39* 3.42*  HCT 30.0* 30.1*  PLT 266 307    Recent Labs  07/06/13 0710 07/07/13 0320  NA 139 139  K 3.6 2.9*  CL 103 102  CO2 30 27  BUN 10 17  CREATININE 0.60 0.71  GLUCOSE 94 100*  CALCIUM 8.0* 8.2*   No results found for this basename: LABPT, INR,  in the last 72 hours  Neurologically intact ABD soft Neurovascular intact Sensation intact distally Intact pulses distally Dorsiflexion/Plantar flexion intact Incision: dressing C/D/I and no drainage Compartment soft Ecchymosis noted to medial and lateral aspect of right foot and ankle Negative for calf pain.  Negative Homan's sign.     Assessment/Plan: 5 Days Post-Op Procedure(s) (LRB): IRRIGATION AND DEBRIDEMENT RIGHT 1ST Metaphalangeal JOINT (Right) Continue ABX therapy due to Post-op infection.     Shelly [name] F 07/07/2013, 7:45 AM

## 2013-07-07 NOTE — Progress Notes (Signed)
[facility] PROGRESS NOTE  Shelly [name] ZOX:[id number] DOB: [birth year] DOA: 07/01/2013 PCP: Hannah Beat, MD  Brief summary. 42 y.o. F who underwent right great toe chilectomy 1 month ago (Dr. Eulah Pont). Had stitches removed post op week 2 - 3, then wound began to dehisce. On 11/17, pt began to have fever/chills and generalized weakness/not feeling well. Came to ER 11/18, found to have Sepsis/hypotension, and CHF  Assessment/Plan:  1. S/p septic shock; toxic shock with strep A abscess; off pressors; improved   2. Possible pneumonia BL; CXR: Development of diffuse interstitial and airspace densities -clinically better; cont atx, bronchodilators, oxygen;   3. New CHF, systolic HF ? Sepsis induced; trop neg; possible pericarditis  -echo: LVEF 45%, mild dilated LV, diffuse hypokinesia;   -cont lasix daily; replace lytes;  -NSAID for possible pericarditis per cardiology; appreciate cardiology input; f/u outpatient in 1 month   4. R foot abscess; s/p I&D +GROUP A STREP (S.PYOGENES)  -cont atx changed to PO; blood, urine c/s NTD; appreciate ID input; need atx 10-14 days post op; zyvox or clindamycin   5. Anemia likely dilutional; I/O + 10. L; no s/sof acute bleeding;  -cont monitor;   Code Status: full Family Communication: husband at the bedside (indicate person spoken with, relationship, and if by phone, the number) Disposition Plan: home 07/07/13  Consultants:  ID, ortho    10/20 Right great toe chilectomy  11/17 Began to experience fever/chills/weakness.  11/18 Presented to ER, found to be hypotensive and tachycardic. XRay of R great toe showed no acute fracture or evidence of osteo. MRI of R great toe showed no evidence of osteo/tenosynovitis/septic joint. Did show dorsal soft tissue abscess, 6cm diameter. Levo started.  11/19 To OR for I&D of right toe/foot  LINES / TUBES:  Right IJ TLC 11/18 >>>  CULTURES:  Blood 11/18 >>>  Urine 11/18 >>> neg  Wound 11/18 >>>   ANTIBIOTICS:  Vanc 11/18 >>> 11/19  Zosyn 11/18 >>> 11/19  Ceftriaxone 11/19 >>>  Zyvox 11/19 >>>   HPI/Subjective: Alert, feeling better   Objective: Filed Vitals:   07/07/13 0601  BP: 121/69  Pulse: 70  Temp: 98.8 F (37.1 C)  Resp: 20    Intake/Output Summary (Last 24 hours) at 07/07/13 0916 Last data filed at 07/06/13 1300  Gross per 24 hour  Intake    240 ml  Output      0 ml  Net    240 ml   Filed Weights   07/03/13 0500 07/04/13 0556 07/07/13 0601  Weight: 77.9 kg (171 lb 11.8 oz) 74.1 kg (163 lb 5.8 oz) 73.12 kg (161 lb 3.2 oz)    Exam:   General:  alert  Cardiovascular: S1, S2 RRR  Respiratory: LL crackles   Abdomen: soft, nt ,nd   Musculoskeletal: surgical wound dressed    Data Reviewed: Basic Metabolic Panel:  Recent Labs Lab 07/01/13 0549  07/01/13 1734 07/02/13 0415 07/03/13 0500 07/04/13 0714 07/05/13 1020 07/06/13 0710 07/07/13 0320  NA 139  --  138 139 137 139 139 139 139  K 4.3  --  3.9 3.7 3.9 3.3* 2.9* 3.6 2.9*  CL 102  --  107 109 109 104 103 103 102  CO2  --   < > 18* 19 22 26 28 30 27   GLUCOSE 120*  --  129* 132* 94 103* 117* 94 100*  BUN 18  --  16 16 11 10 9 10 17   CREATININE 1.00  < > 1.06 0.95  0.70 0.72 0.64 0.60 0.71  CALCIUM  --   < > 6.8* 6.4* 7.5* 7.8* 8.0* 8.0* 8.2*  MG  --   --  1.0* 2.1  --   --   --   --   --   PHOS  --   --  3.2 3.1  --   --   --   --   --   < > = values in this interval not displayed. Liver Function Tests:  Recent Labs Lab 07/01/13 1734  AST 18  ALT 17  ALKPHOS 26*  BILITOT 0.4  PROT 4.9*  ALBUMIN 2.5*   No results found for this basename: LIPASE, AMYLASE,  in the last 168 hours No results found for this basename: AMMONIA,  in the last 168 hours CBC:  Recent Labs Lab 07/01/13 0540  07/01/13 1734 07/02/13 0415 07/03/13 0500 07/04/13 0714 07/06/13 1715 07/07/13 0320  WBC 11.0*  < > 23.7* 24.2* 15.1* 10.4 8.2 8.8  NEUTROABS 10.5*  --  22.8*  --   --   --   --   --   HGB  12.4  < > 10.4* 9.7* 9.1* 9.3* 10.2* 10.1*  HCT 36.6  < > 29.3* 28.0* 27.0* 26.3* 30.0* 30.1*  MCV 88.8  < > 87.5 88.9 88.2 85.9 88.5 88.0  PLT 228  < > 191 167 137* 150 266 307  < > = values in this interval not displayed. Cardiac Enzymes:  Recent Labs Lab 07/01/13 1734 07/02/13 0415 07/02/13 0945  TROPONINI <0.30 <0.30 <0.30   BNP (last 3 results) No results found for this basename: PROBNP,  in the last 8760 hours CBG:  Recent Labs Lab 07/01/13 1213 07/01/13 1948 07/02/13 1212  GLUCAP 104* 113* 94    Recent Results (from the past 240 hour(s))  WOUND CULTURE     Status: None   Collection Time    07/01/13  5:59 AM      Result Value Range Status   Specimen Description WOUND RIGHT FOOT   Final   Special Requests RIGHT FOOT GREAT TOE   Final   Gram Stain     Final   Value: MODERATE WBC PRESENT, PREDOMINANTLY PMN     NO SQUAMOUS EPITHELIAL CELLS SEEN     ABUNDANT GRAM POSITIVE COCCI IN PAIRS     Performed at [facility]   Culture     Final   Value: ABUNDANT GROUP A STREP (S.PYOGENES) ISOLATED     Performed at [facility]   Report Status 07/03/2013 FINAL   Final  CULTURE, BLOOD (ROUTINE X 2)     Status: None   Collection Time    07/01/13 10:00 AM      Result Value Range Status   Specimen Description BLOOD LEFT ANTECUBITAL   Final   Special Requests BOTTLES DRAWN AEROBIC ONLY 10CC   Final   Culture  Setup Time     Final   Value: 07/01/2013 15:03     Performed at [facility]   Culture     Final   Value: NO GROWTH 5 DAYS     Performed at [facility]   Report Status 07/07/2013 FINAL   Final  CULTURE, BLOOD (ROUTINE X 2)     Status: None   Collection Time    07/01/13 10:10 AM      Result Value Range Status   Specimen Description BLOOD ARM LEFT   Final   Special Requests BOTTLES DRAWN AEROBIC  AND ANAEROBIC 10CC   Final   Culture  Setup Time     Final   Value: 07/01/2013 15:04     Performed at [facility]   Culture      Final   Value: NO GROWTH 5 DAYS     Performed at [facility]   Report Status 07/07/2013 FINAL   Final  URINE CULTURE     Status: None   Collection Time    07/01/13 12:13 PM      Result Value Range Status   Specimen Description URINE, CLEAN CATCH   Final   Special Requests NONE   Final   Culture  Setup Time     Final   Value: 07/01/2013 18:10     Performed at [facility] Count     Final   Value: NO GROWTH     Performed at [facility]   Culture     Final   Value: NO GROWTH     Performed at [facility]   Report Status 07/02/2013 FINAL   Final  MRSA PCR SCREENING     Status: None   Collection Time    07/01/13 12:14 PM      Result Value Range Status   MRSA by PCR NEGATIVE  NEGATIVE Final   Comment:            The GeneXpert MRSA Assay (FDA     approved for NASAL specimens     only), is one component of a     comprehensive MRSA colonization     surveillance program. It is not     intended to diagnose MRSA     infection nor to guide or     monitor treatment for     MRSA infections.     Studies: No results found.  Scheduled Meds: . furosemide  20 mg Intravenous Q breakfast  . heparin  5,000 Units Subcutaneous Q8H  . ibuprofen  600 mg Oral TID  . linezolid  600 mg Oral Q12H  . magnesium sulfate 1 - 4 g bolus IVPB  2 g Intravenous Once  . potassium chloride  60 mEq Oral BID   Continuous Infusions:    Principal Problem:   SIRS (systemic inflammatory response syndrome) Active Problems:   Right foot infection   Hypotension   Tachycardia   Septic shock    Time spent: >35 minutes     Esperanza Sheets  [facility] Pager [phone]. If 7PM-7AM, please contact night-coverage at [email or web address], password [facility] 07/07/2013, 9:16 AM  LOS: 6 days

## 2013-07-08 LAB — BASIC METABOLIC PANEL
BUN: 14 mg/dL (ref 6–23)
Chloride: 103 mEq/L (ref 96–112)
GFR calc Af Amer: >90 mL/min (ref >90)
Potassium: 4.2 mEq/L (ref 3.5–5.1)
Sodium: 139 mEq/L (ref 135–145)

## 2013-07-08 MED ORDER — ACETAMINOPHEN 325 MG PO TABS: 650.0000 mg | ORAL_TABLET | ORAL | Status: DC | PRN

## 2013-07-08 MED ORDER — LINEZOLID 600 MG PO TABS: 600.0000 mg | ORAL_TABLET | Freq: Two times a day (BID) | ORAL | Status: DC

## 2013-07-08 MED ORDER — IBUPROFEN 600 MG PO TABS: 600.0000 mg | ORAL_TABLET | Freq: Three times a day (TID) | ORAL | Status: DC

## 2013-07-08 MED ORDER — FLUCONAZOLE 100 MG PO TABS: 100.0000 mg | ORAL_TABLET | Freq: Every day | ORAL | Status: DC

## 2013-07-08 NOTE — Progress Notes (Signed)
Orthopedic Tech Progress Note Patient Details:  Shelly [name] May 02, 1971 [id number]  Ortho Devices Type of Ortho Device: Postop shoe/boot Ortho Device/Splint Interventions: Application   Shawnie Pons 07/08/2013, 9:29 AM

## 2013-07-08 NOTE — Discharge Summary (Signed)
Physician Discharge Summary  Shelly [name] WGN:[id number] DOB: 03/11/1971 DOA: 07/01/2013  PCP: Shelly Beat, MD  Admit date: 07/01/2013 Discharge date: 07/08/2013  Time spent: >35 minutes  Recommendations for Outpatient Follow-up:  F/u with ortho nextweek  F/u with cardiology in 2-3 weeks F/u with PCP in 1 weeks as needed Discharge Diagnoses:  Principal Problem:   SIRS (systemic inflammatory response syndrome) Active Problems:   Right foot infection   Hypotension   Tachycardia   Septic shock   Discharge Condition: stable  Diet recommendation: heart healthy  Filed Weights   07/04/13 0556 07/07/13 0601 07/08/13 0500  Weight: 74.1 kg (163 lb 5.8 oz) 73.12 kg (161 lb 3.2 oz) 72.576 kg (160 lb)    History of present illness:  42 y.o. F who underwent right great toe chilectomy 1 month ago (Dr. Eulah Pont). Had stitches removed post op week 2 - 3, then wound began to dehisce. On 11/17, pt began to have fever/chills and generalized weakness/not feeling well. Came to ER 11/18, found to have Sepsis/hypotension, and CHF   Hospital Course:  1. S/p septic shock; toxic shock with strep A abscess; off pressors;resolved  2. Possible pneumonia BL; CXR: Development of diffuse interstitial and airspace densities  -resolved on atx 3. New CHF, systolic HF ? Sepsis induced; trop neg; possible pericarditis  -echo: LVEF 45%, mild dilated LV, diffuse hypokinesia;  -symptoms resolved on diuresis; no diuretics upon discharge,likely CHF was due to sepsis   -NSAID for possible pericarditis per cardiology; appreciate cardiology input; f/u outpatient in 1 month  4. R foot abscess; s/p I&D +GROUP A STREP (S.PYOGENES)  -cont atx changed to PO; blood, urine c/s NTD; per ID need atx 10-14 days post op; zyvox PO given upon d/c  5. Anemia likely dilutional; I/O + 10. L; no s/sof acute bleeding;     Procedures:  I&D foot (i.e. Studies not automatically included, echos, thoracentesis, etc; not  x-rays)  Consultations:  Ortho, cardiology, infectious disease   Discharge Exam: Filed Vitals:   07/08/13 0554  BP: 111/64  Pulse: 61  Temp: 97.8 F (36.6 C)  Resp: 18    General: alert Cardiovascular: S1,S2, rrr Respiratory: CTA BL   Discharge Instructions  Discharge Orders   Future Appointments Provider Department Dept Phone   08/04/2013 11:30 AM Donato Schultz, MD [facility] [phone]   Future Orders Complete By Expires   Diet - low sodium heart healthy  As directed    Discharge instructions  As directed    Comments:     Please follow up with orthopedics Please follow up with cardiology in 1 month   Increase activity slowly  As directed        Medication List         acetaminophen 325 MG tablet  Commonly known as:  TYLENOL  Take 2 tablets (650 mg total) by mouth every 4 (four) hours as needed for fever or mild pain.     fluconazole 100 MG tablet  Commonly known as:  DIFLUCAN  Take 1 tablet (100 mg total) by mouth daily.     ibuprofen 600 MG tablet  Commonly known as:  ADVIL,MOTRIN  Take 1 tablet (600 mg total) by mouth 3 (three) times daily.     linezolid 600 MG tablet  Commonly known as:  ZYVOX  Take 1 tablet (600 mg total) by mouth every 12 (twelve) hours.     multivitamin tablet  Take 1 tablet by mouth daily.  Allergies  Allergen Reactions  . Dilaudid [Hydromorphone Hcl]   . Codeine     REACTION: Hives       Follow-up Information   Follow up with Shelly Apley, MD Today.   Specialty:  Orthopedic Surgery   Contact information:   [address] [place] [address] [phone]       Follow up with Donato Schultz, MD In 1 month.   Specialty:  Cardiology   Contact information:   [address] [place] [address] [phone]       Follow up with Shelly Beat, MD In 5 days.   Specialty:  Family Medicine   Contact information:   [address] [place] [address]  [place] [address] [phone]        The results of significant diagnostics from this hospitalization (including imaging, microbiology, ancillary and laboratory) are listed below for reference.    Significant Diagnostic Studies: Dg Chest 2 View  07/04/2013   CLINICAL DATA:  Rule out neoplasm.  EXAM: CHEST  2 VIEW  COMPARISON:  Chest x-ray from the same day at 5:56 a.m.  FINDINGS: Slight improvement in pulmonary edema, with still patchy perihilar predominant interstitial and airspace opacities. There may be small pleural effusions. No change in heart size.  IMPRESSION: 1. Improved pulmonary edema. 2. Cannot exclude pulmonary nodules until after there has been complete clearance of edema.   Electronically Signed   By: Tiburcio Pea M.D.   On: 07/04/2013 05:24   Mr Foot Right W Wo Contrast  07/03/2013   ADDENDUM REPORT: 07/03/2013 16:11  ADDENDUM: Correction:  Impression #1 should read, " "Increased cellulitis and infiltrating abscess in the subcutaneous fat of the dorsum of the RIGHT foot."   Electronically Signed   By: Geanie Cooley M.D.   On: 07/03/2013 16:11   07/03/2013   CLINICAL DATA:  Septic shock.  Right foot infection.  EXAM: MRI OF THE RIGHT FOREFOOT WITHOUT AND WITH CONTRAST  TECHNIQUE: Multiplanar, multisequence MR imaging was performed both before and after administration of intravenous contrast.  CONTRAST:  15mL MULTIHANCE GADOBENATE DIMEGLUMINE 529 MG/ML IV SOLN  COMPARISON:  Radiographs dated 07/02/2013 and MRI dated 07/01/2013  FINDINGS: Since the prior examination the patient has developed increased abnormal fluid in the subcutaneous fat of the dorsum of the foot with adjacent edema surrounding these more abnormal fluid collections. This is consistent with extension of the soft tissue abscess more proximally than on the prior study. The abnormal fluid extends across the dorsum of the foot particularly more laterally than previously and extends at least to the  level of the distal talus. The more proximal foot is not visible on this exam.  Again noted are postsurgical changes at the 1st metatarsophalangeal joint. The edema in the head of the 1st metatarsal is consistent with a prior surgery and is not suggestive of osteomyelitis. There is no fluid in the 1st metatarsal phalangeal joint is suggested septic joint.  IMPRESSION: 1. Increased cellulitis and infiltrating abscess in the subcutaneous fat of the dorsum of the left foot. 2. Stable postsurgical changes of the 1st metatarsal phalangeal joint.  Electronically Signed: By: Geanie Cooley M.D. On: 07/03/2013 14:23   Mr Foot Right W Wo Contrast  07/01/2013   CLINICAL DATA:  Large nonhealing wound on the dorsal aspect of the foot.  EXAM: MRI OF THE RIGHT FOREFOOT WITHOUT AND WITH CONTRAST  TECHNIQUE: Multiplanar, multisequence MR imaging was performed both before  and after administration of intravenous contrast.  CONTRAST:  14mL MULTIHANCE GADOBENATE DIMEGLUMINE 529 MG/ML IV SOLN  COMPARISON:  Radiographs dated 07/01/2013  FINDINGS: Soft tissue wound is seen in the subcutaneous fat of the dorsum of the foot overlying the head of the 1st metatarsal. There is no significant joint effusion. Minimal edema in the head of the 1st metatarsal is consistent with recent surgery. The finding is not suggestive of osteomyelitis.  There is an abscess which extends laterally across the dorsum of the foot in the subcutaneous fat. There is extensive nonenhancing fluid in the dorsum of the foot which could represent edema or pus. This is contiguous with the abscess which extends medially from the soft tissue wound. This collection of fluid extends over a 6 cm diameter area on the dorsum of the foot.  The other visualized bones of the forefoot are normal. Muscles and tendons appear normal.  After contrast administration there is enhancement of the soft tissues around the dorsal aspect of the 1st metatarsal phalangeal joint consistent with  cellulitis.  IMPRESSION: 1. No evidence of osteomyelitis or tenosynovitis. No evidence of septic 1st metatarsal phalangeal joint. 2. Dorsal soft tissue abscess extending laterally across the dorsum of the foot from the soft tissue wound. The abnormal fluid collection extends in a 6 cm diameter area. Some of this could be pus and some could be reactive edema but I cannot differentiate between the two.   Electronically Signed   By: Geanie Cooley M.D.   On: 07/01/2013 09:55   Dg Chest Port 1 View  07/04/2013   CLINICAL DATA:  Shortness of breath, increased heart rate with ambulation, postprocedure  EXAM: PORTABLE CHEST - 1 VIEW  COMPARISON:  Portable exam 0723 hr compared to 07/03/2013  FINDINGS: Normal heart size and mediastinal contours for technique.  Interval improvement of bilateral diffuse pulmonary infiltrates question edema though infection is not excluded.  Atelectasis and probable small effusion at left lung base, though cannot exclude underlying infiltrate or nodule.  No pneumothorax.  Prior cervical spine fusion.  IMPRESSION: Interval improvement of diffuse bilateral pulmonary infiltrates question edema versus infection.  Persistent atelectasis and question small effusion at left lung base.   Electronically Signed   By: Ulyses Southward M.D.   On: 07/04/2013 07:52   Dg Chest Port 1 View  07/03/2013   ADDENDUM REPORT: 07/03/2013 11:27  ADDENDUM: Because there is a question of a possible nodule versus nipple shadow over the right lung base, recommend PA and lateral chest radiographs with nipple markers in place after the patient recovers from the current illness, to exclude underlying possible neoplasm. If there is persistent nodularity in the right lung base separate from the right nipple, then chest CT on a nonemergent basis as an outpatient would be recommended for further evaluation. It is possible this finding represents asymmetric edema given the finding on subsequent radiographs, however followup  imaging is recommended to ensure lack of underlying mass. These results will be called to the ordering clinician or representative by the Radiologist Assistant, and communication documented in the PACS Dashboard.   Electronically Signed   By: Christiana Pellant M.D.   On: 07/03/2013 11:27   07/03/2013   CLINICAL DATA:  Chest tightness  EXAM: PORTABLE CHEST - 1 VIEW  COMPARISON:  07/01/2013, 11/15/2009  FINDINGS: Cervical fusion hardware partly visualized. Right IJ central line tip terminates over the distal SVC. Heart size upper limits of normal. Nipple shadow reidentified over the right lung base. No new pulmonary  opacity or pleural effusion.  IMPRESSION: No new acute finding.  Electronically Signed: By: Christiana Pellant M.D. On: 07/01/2013 18:29   Dg Chest Port 1 View  07/03/2013   CLINICAL DATA:  Chest tightness and cough.  EXAM: PORTABLE CHEST - 1 VIEW  COMPARISON:  07/01/2013  FINDINGS: Development of diffuse interstitial and patchy airspace densities. This is a marked change from the prior examination. Right jugular central venous catheter in the lower SVC region. The left hemidiaphragm is obscured and cannot exclude consolidation or pleural fluid in this area. Heart size is grossly stable.  IMPRESSION: Development of diffuse interstitial and airspace densities. Findings are most compatible with pulmonary edema.  Consolidation or pleural fluid at the left lung base.   Electronically Signed   By: Richarda Overlie M.D.   On: 07/03/2013 07:56   Dg Chest Port 1 View  07/01/2013   CLINICAL DATA:  42 year old female with central line placement and fever. Initial encounter.  EXAM: PORTABLE CHEST - 1 VIEW  COMPARISON:  11/15/2009.  FINDINGS: Portable AP semi upright view at at 1316 hrs. Right IJ approach central line. Catheter tip at the lower SVC level between the carinal a and cavoatrial junction. Mildly lower lung volumes. Normal cardiac size and mediastinal contours. Visualized tracheal air column is within normal  limits. Cervical ACDF hardware partially visible. Allowing for portable technique, the lungs are clear. No pneumothorax.  IMPRESSION: Right IJ central line placed, tip at the lower SVC level. No pneumothorax or acute cardiopulmonary abnormality.   Electronically Signed   By: Augusto Gamble M.D.   On: 07/01/2013 13:34   Dg Foot Complete Right  07/02/2013   CLINICAL DATA:  Postop right great toe infection  EXAM: RIGHT FOOT COMPLETE - 3+ VIEW  COMPARISON:  MRI 07/01/2013, plain film 07/01/2013  FINDINGS: No osseous abnormality of the 1st digit. No soft tissue abnormality. Mild swelling of dorsum of foot.  IMPRESSION: No osseous abnormality. Swelling over the dorsum of the foot similar to prior. .   Electronically Signed   By: Genevive Bi M.D.   On: 07/02/2013 17:52   Dg Foot Complete Right  07/01/2013   CLINICAL DATA:  Swelling under postoperative 2nd metatarsal site. Evaluate for osteomyelitis.  EXAM: RIGHT FOOT COMPLETE - 3+ VIEW  COMPARISON:  None available for comparison at time of study interpretation.  FINDINGS: No acute fracture deformity or dislocation. Mild 1st metatarsophalangeal joint space narrowing with periarticular sclerosis and marginal spurring consistent with osteoarthrosis. Joint space intact without erosions. No destructive bony lesions. Soft tissue planes are not suspicious.  IMPRESSION: No acute fracture deformity or dislocation. No radiographic findings of osteomyelitis though, if suspicion persists, MRI would be more sensitive.  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   Electronically Signed   By: Awilda Metro   On: 07/01/2013 06:42    Microbiology: Recent Results (from the past 240 hour(s))  WOUND CULTURE     Status: None   Collection Time    07/01/13  5:59 AM      Result Value Range Status   Specimen Description WOUND RIGHT FOOT   Final   Special Requests RIGHT FOOT GREAT TOE   Final   Gram Stain     Final   Value: MODERATE WBC PRESENT, PREDOMINANTLY PMN     NO SQUAMOUS  EPITHELIAL CELLS SEEN     ABUNDANT GRAM POSITIVE COCCI IN PAIRS     Performed at [facility]   Culture     Final   Value:  ABUNDANT GROUP A STREP (S.PYOGENES) ISOLATED     Performed at [facility]   Report Status 07/03/2013 FINAL   Final  CULTURE, BLOOD (ROUTINE X 2)     Status: None   Collection Time    07/01/13 10:00 AM      Result Value Range Status   Specimen Description BLOOD LEFT ANTECUBITAL   Final   Special Requests BOTTLES DRAWN AEROBIC ONLY 10CC   Final   Culture  Setup Time     Final   Value: 07/01/2013 15:03     Performed at [facility]   Culture     Final   Value: NO GROWTH 5 DAYS     Performed at [facility]   Report Status 07/07/2013 FINAL   Final  CULTURE, BLOOD (ROUTINE X 2)     Status: None   Collection Time    07/01/13 10:10 AM      Result Value Range Status   Specimen Description BLOOD ARM LEFT   Final   Special Requests BOTTLES DRAWN AEROBIC AND ANAEROBIC 10CC   Final   Culture  Setup Time     Final   Value: 07/01/2013 15:04     Performed at [facility]   Culture     Final   Value: NO GROWTH 5 DAYS     Performed at [facility]   Report Status 07/07/2013 FINAL   Final  URINE CULTURE     Status: None   Collection Time    07/01/13 12:13 PM      Result Value Range Status   Specimen Description URINE, CLEAN CATCH   Final   Special Requests NONE   Final   Culture  Setup Time     Final   Value: 07/01/2013 18:10     Performed at [facility] Count     Final   Value: NO GROWTH     Performed at [facility]   Culture     Final   Value: NO GROWTH     Performed at [facility]   Report Status 07/02/2013 FINAL   Final  MRSA PCR SCREENING     Status: None   Collection Time    07/01/13 12:14 PM      Result Value Range Status   MRSA by PCR NEGATIVE  NEGATIVE Final   Comment:            The GeneXpert MRSA Assay (FDA     approved for NASAL specimens     only), is one  component of a     comprehensive MRSA colonization     surveillance program. It is not     intended to diagnose MRSA     infection nor to guide or     monitor treatment for     MRSA infections.     Labs: Basic Metabolic Panel:  Recent Labs Lab 07/01/13 1734 07/02/13 0415  07/04/13 0714 07/05/13 1020 07/06/13 0710 07/07/13 0320 07/08/13 0630  NA 138 139  < > 139 139 139 139 139  K 3.9 3.7  < > 3.3* 2.9* 3.6 2.9* 4.2  CL 107 109  < > 104 103 103 102 103  CO2 18* 19  < > 26 28 30 27 26   GLUCOSE 129* 132*  < > 103* 117* 94 100* 87  BUN 16 16  < > 10 9 10 17 14   CREATININE 1.06 0.95  < >  0.72 0.64 0.60 0.71 0.69  CALCIUM 6.8* 6.4*  < > 7.8* 8.0* 8.0* 8.2* 8.4  MG 1.0* 2.1  --   --   --   --   --   --   PHOS 3.2 3.1  --   --   --   --   --   --   < > = values in this interval not displayed. Liver Function Tests:  Recent Labs Lab 07/01/13 1734  AST 18  ALT 17  ALKPHOS 26*  BILITOT 0.4  PROT 4.9*  ALBUMIN 2.5*   No results found for this basename: LIPASE, AMYLASE,  in the last 168 hours No results found for this basename: AMMONIA,  in the last 168 hours CBC:  Recent Labs Lab 07/01/13 1734 07/02/13 0415 07/03/13 0500 07/04/13 0714 07/06/13 1715 07/07/13 0320  WBC 23.7* 24.2* 15.1* 10.4 8.2 8.8  NEUTROABS 22.8*  --   --   --   --   --   HGB 10.4* 9.7* 9.1* 9.3* 10.2* 10.1*  HCT 29.3* 28.0* 27.0* 26.3* 30.0* 30.1*  MCV 87.5 88.9 88.2 85.9 88.5 88.0  PLT 191 167 137* 150 266 307   Cardiac Enzymes:  Recent Labs Lab 07/01/13 1734 07/02/13 0415 07/02/13 0945  TROPONINI <0.30 <0.30 <0.30   BNP: BNP (last 3 results) No results found for this basename: PROBNP,  in the last 8760 hours CBG:  Recent Labs Lab 07/01/13 1213 07/01/13 1948 07/02/13 1212  GLUCAP 104* 113* 94       Signed:  Serena Petterson N  [facility] 07/08/2013, 10:32 AM

## 2013-07-08 NOTE — Progress Notes (Signed)
Subjective: 6 Days Post-Op Procedure(s) (LRB): IRRIGATION AND DEBRIDEMENT RIGHT 1ST Metaphalangeal JOINT (Right) Patient reports pain as 1 on 0-10 scale.  Patient is doing very well today.  No fever, chills, nausea, or vomiting.  She has normal bowel and bladder function.  Tolerating heart-healthy diet.  She was switched over to PO abx and will likely be discharged home today.  Weight bearing as tolerated in post-op shoe.    Objective: Vital signs in last 24 hours: Temp:  [97.8 F (36.6 C)-98.2 F (36.8 C)] 97.8 F (36.6 C) (11/25 0554) Pulse Rate:  [61-79] 61 (11/25 0554) Resp:  [18] 18 (11/25 0554) BP: (106-111)/(61-64) 111/64 mmHg (11/25 0554) SpO2:  [96 %-97 %] 97 % (11/25 0554) Weight:  [72.576 kg (160 lb)] 72.576 kg (160 lb) (11/25 0500)  Intake/Output from previous day: 11/24 0701 - 11/25 0700 In: 720 [P.O.:720] Out: -  Intake/Output this shift:     Recent Labs  07/06/13 1715 07/07/13 0320  HGB 10.2* 10.1*    Recent Labs  07/06/13 1715 07/07/13 0320  WBC 8.2 8.8  RBC 3.39* 3.42*  HCT 30.0* 30.1*  PLT 266 307    Recent Labs  07/06/13 0710 07/07/13 0320  NA 139 139  K 3.6 2.9*  CL 103 102  CO2 30 27  BUN 10 17  CREATININE 0.60 0.71  GLUCOSE 94 100*  CALCIUM 8.0* 8.2*   No results found for this basename: LABPT, INR,  in the last 72 hours  Neurologically intact ABD soft Neurovascular intact Sensation intact distally Intact pulses distally Dorsiflexion/Plantar flexion intact Compartment soft Incision looks good.  Minimal erythema, no purulent discharge noted.   Assessment/Plan: 6 Days Post-Op Procedure(s) (LRB): IRRIGATION AND DEBRIDEMENT RIGHT 1ST Metaphalangeal JOINT (Right) Advance diet Weight bearing as tolerated in post-op shoe. Follow-up in office next week.    Nolon Yellin F 07/08/2013, 8:05 AM

## 2013-07-08 NOTE — Progress Notes (Signed)
[facility]    Day # 7 zyvox      Antibiotics:  Anti-infectives   Start     Dose/Rate Route Frequency Ordered Stop   07/08/13 0000  linezolid (ZYVOX) 600 MG tablet     600 mg Oral Every 12 hours 07/08/13 1031     07/08/13 0000  fluconazole (DIFLUCAN) 100 MG tablet     100 mg Oral Daily 07/08/13 1032     07/05/13 1000  linezolid (ZYVOX) tablet 600 mg     600 mg Oral Every 12 hours 07/05/13 0955     07/02/13 2200  linezolid (ZYVOX) IVPB 600 mg  Status:  Discontinued     600 mg 300 mL/hr over 60 Minutes Intravenous Every 12 hours 07/02/13 1602 07/05/13 0955   07/02/13 1615  cefTRIAXone (ROCEPHIN) 2 g in dextrose 5 % 50 mL IVPB  Status:  Discontinued     2 g 100 mL/hr over 30 Minutes Intravenous Every 24 hours 07/02/13 1602 07/04/13 1308   07/01/13 2200  [MAR Hold]  piperacillin-tazobactam (ZOSYN) IVPB 3.375 g  Status:  Discontinued     (On MAR Hold since 07/02/13 1304)   3.375 g 12.5 mL/hr over 240 Minutes Intravenous 3 times per day 07/01/13 1147 07/02/13 1602   07/01/13 1400  [MAR Hold]  vancomycin (VANCOCIN) IVPB 750 mg/150 ml premix  Status:  Discontinued     (On MAR Hold since 07/02/13 1304)   750 mg 150 mL/hr over 60 Minutes Intravenous Every 8 hours 07/01/13 1147 07/02/13 1602   07/01/13 1130  piperacillin-tazobactam (ZOSYN) IVPB 3.375 g     3.375 g 100 mL/hr over 30 Minutes Intravenous STAT 07/01/13 1118 07/01/13 1217   07/01/13 0545  vancomycin (VANCOCIN) IVPB 1000 mg/200 mL premix     1,000 mg 200 mL/hr over 60 Minutes Intravenous  Once 07/01/13 0542 07/01/13 0649      Medications: Scheduled Meds: . furosemide  20 mg Intravenous Q breakfast  . heparin  5,000 Units Subcutaneous Q8H  . ibuprofen  600 mg Oral TID  . linezolid  600 mg Oral Q12H  . potassium chloride  60 mEq Oral BID   Continuous Infusions:  PRN Meds:.sodium chloride, acetaminophen, fentaNYL, ibuprofen, influenza vac split quadrivalent PF, MUSCLE RUB, ondansetron  (ZOFRAN) IV   Objective: Weight change: -1 lb 3.2 oz (-0.544 kg)  Intake/Output Summary (Last 24 hours) at 07/08/13 1147 Last data filed at 07/07/13 1853  Gross per 24 hour  Intake    480 ml  Output      0 ml  Net    480 ml   Blood pressure 111/64, pulse 61, temperature 97.8 F (36.6 C), temperature source Oral, resp. rate 18, height 5\' 6"  (1.676 m), weight 160 lb (72.576 kg), last menstrual period 07/04/2013, SpO2 97.00%. Temp:  [97.8 F (36.6 C)-98.2 F (36.8 C)] 97.8 F (36.6 C) (11/25 0554) Pulse Rate:  [61-79] 61 (11/25 0554) Resp:  [18] 18 (11/25 0554) BP: (106-111)/(61-64) 111/64 mmHg (11/25 0554) SpO2:  [96 %-97 %] 97 % (11/25 0554) Weight:  [160 lb (72.576 kg)] 160 lb (72.576 kg) (11/25 0500)  Physical Exam: General: Alert and awake, oriented x3, flushed  HEENT: anicteric sclera, pupils reactive to light and accommodation, EOMI, oropharynx with "strawberry tongue"better  CVS regular rate, normal r,  no murmur rubs or gallops Chest: no wheezing, rales or rhonchi Abdomen:  nondistended, normal bowel sounds,  EXt: foot bandaged, rash on leg has not extended  My  note 2 days ago shows sequential images, today just todays images  Neuro: nonfocal  Lab Results:  Recent Labs  07/06/13 1715 07/07/13 0320  WBC 8.2 8.8  HGB 10.2* 10.1*  HCT 30.0* 30.1*  PLT 266 307    BMET  Recent Labs  07/07/13 0320 07/08/13 0630  NA 139 139  K 2.9* 4.2  CL 102 103  CO2 27 26  GLUCOSE 100* 87  BUN 17 14  CREATININE 0.71 0.69  CALCIUM 8.2* 8.4    Micro Results: Recent Results (from the past 240 hour(s))  WOUND CULTURE     Status: None   Collection Time    07/01/13  5:59 AM      Result Value Range Status   Specimen Description WOUND RIGHT FOOT   Final   Special Requests RIGHT FOOT GREAT TOE   Final   Gram Stain     Final   Value: MODERATE WBC PRESENT, PREDOMINANTLY PMN     NO SQUAMOUS EPITHELIAL CELLS SEEN     ABUNDANT GRAM POSITIVE COCCI IN PAIRS      Performed at [facility]   Culture     Final   Value: ABUNDANT GROUP A STREP (S.PYOGENES) ISOLATED     Performed at [facility]   Report Status 07/03/2013 FINAL   Final  CULTURE, BLOOD (ROUTINE X 2)     Status: None   Collection Time    07/01/13 10:00 AM      Result Value Range Status   Specimen Description BLOOD LEFT ANTECUBITAL   Final   Special Requests BOTTLES DRAWN AEROBIC ONLY 10CC   Final   Culture  Setup Time     Final   Value: 07/01/2013 15:03     Performed at [facility]   Culture     Final   Value: NO GROWTH 5 DAYS     Performed at [facility]   Report Status 07/07/2013 FINAL   Final  CULTURE, BLOOD (ROUTINE X 2)     Status: None   Collection Time    07/01/13 10:10 AM      Result Value Range Status   Specimen Description BLOOD ARM LEFT   Final   Special Requests BOTTLES DRAWN AEROBIC AND ANAEROBIC 10CC   Final   Culture  Setup Time     Final   Value: 07/01/2013 15:04     Performed at [facility]   Culture     Final   Value: NO GROWTH 5 DAYS     Performed at [facility]   Report Status 07/07/2013 FINAL   Final  URINE CULTURE     Status: None   Collection Time    07/01/13 12:13 PM      Result Value Range Status   Specimen Description URINE, CLEAN CATCH   Final   Special Requests NONE   Final   Culture  Setup Time     Final   Value: 07/01/2013 18:10     Performed at [facility] Count     Final   Value: NO GROWTH     Performed at [facility]   Culture     Final   Value: NO GROWTH     Performed at [facility]   Report Status 07/02/2013 FINAL   Final  MRSA PCR SCREENING     Status: None   Collection Time    07/01/13 12:14 PM  Result Value Range Status   MRSA by PCR NEGATIVE  NEGATIVE Final   Comment:            The GeneXpert MRSA Assay (FDA     approved for NASAL specimens     only), is one component of a     comprehensive MRSA colonization     surveillance  program. It is not     intended to diagnose MRSA     infection nor to guide or     monitor treatment for     MRSA infections.    Studies/Results: No results found.    Assessment/Plan: Shelly [name] is a 42 y.o. female with   with foot abscess with GAS abscess in foot with apparent Toxic shock due to Group A Strep:   #1 GAS infection with toxic shock: Her bullous lesions on her foot are coalescing . She has one additional lesion that is more proximal  I am skeptical that these new areas represent actual infection or abscess but rather evolution of her reaction to toxic shock  One could certainly ask a Dermatologist to see her (if they conould be convinced to come into the hospital or at minimum review the images)   --continue  ORALzyvox alone  --Elevate foot with 3-4 pillows  --I would like for the pt to have 14 days postoperative antibiotics (7 more days)  I understand with zyvox assistance she would have to pay $300, if pt does not want to pay such a large sum of money clindamycin 300 mg po qid would be perfectly acceptable   #2 Small pericardial effusion and pleural effusion with mild LV systolic dysfunction. I agree with Dr Anne Fu that this would again seem to be part of her TOxin mediated SIRS  I will arrange HSFU for her with [place] in ID clinic   LOS: 7 days   Acey Lav 07/08/2013, 11:47 AM

## 2013-07-15 ENCOUNTER — Telehealth: Payer: Self-pay | Admitting: Family Medicine

## 2013-07-15 NOTE — Telephone Encounter (Signed)
Left vm for pt to return call to scheduled hospital f/u with Dr. Patsy Lager.

## 2013-07-17 ENCOUNTER — Ambulatory Visit (HOSPITAL_BASED_OUTPATIENT_CLINIC_OR_DEPARTMENT_OTHER): Admission: RE | Admit: 2013-07-17 | Payer: Self-pay | Source: Ambulatory Visit | Admitting: Orthopedic Surgery

## 2013-07-17 ENCOUNTER — Encounter (HOSPITAL_BASED_OUTPATIENT_CLINIC_OR_DEPARTMENT_OTHER): Admission: RE | Payer: Self-pay | Source: Ambulatory Visit

## 2013-07-17 ENCOUNTER — Telehealth: Payer: Self-pay | Admitting: *Deleted

## 2013-07-17 ENCOUNTER — Encounter: Payer: Self-pay | Admitting: Infectious Disease

## 2013-07-17 ENCOUNTER — Ambulatory Visit (INDEPENDENT_AMBULATORY_CARE_PROVIDER_SITE_OTHER): Payer: BC Managed Care – PPO | Admitting: Infectious Disease

## 2013-07-17 VITALS — BP 113/79 | HR 94 | Temp 97.9°F | Wt 145.8 lb

## 2013-07-17 DIAGNOSIS — I429 Cardiomyopathy, unspecified: Secondary | ICD-10-CM

## 2013-07-17 DIAGNOSIS — A483 Toxic shock syndrome: Secondary | ICD-10-CM | POA: Insufficient documentation

## 2013-07-17 DIAGNOSIS — Z23 Encounter for immunization: Secondary | ICD-10-CM

## 2013-07-17 DIAGNOSIS — R42 Dizziness and giddiness: Secondary | ICD-10-CM

## 2013-07-17 DIAGNOSIS — I319 Disease of pericardium, unspecified: Secondary | ICD-10-CM

## 2013-07-17 DIAGNOSIS — I3139 Other pericardial effusion (noninflammatory): Secondary | ICD-10-CM | POA: Insufficient documentation

## 2013-07-17 DIAGNOSIS — I313 Pericardial effusion (noninflammatory): Secondary | ICD-10-CM

## 2013-07-17 DIAGNOSIS — B950 Streptococcus, group A, as the cause of diseases classified elsewhere: Secondary | ICD-10-CM | POA: Insufficient documentation

## 2013-07-17 DIAGNOSIS — L089 Local infection of the skin and subcutaneous tissue, unspecified: Secondary | ICD-10-CM

## 2013-07-17 LAB — CBC WITH DIFFERENTIAL/PLATELET
Hemoglobin: 12.1 g/dL (ref 12.0–15.0)
Lymphocytes Relative: 42 % (ref 12–46)
Lymphs Abs: 2.3 10*3/uL (ref 0.7–4.0)
Monocytes Relative: 7 % (ref 3–12)
Neutro Abs: 2.6 10*3/uL (ref 1.7–7.7)
Neutrophils Relative %: 47 % (ref 43–77)
Platelets: 368 10*3/uL (ref 150–400)
RBC: 4.05 MIL/uL (ref 3.87–5.11)
RDW: 14 % (ref 11.5–15.5)
WBC: 5.6 10*3/uL (ref 4.0–10.5)

## 2013-07-17 LAB — BASIC METABOLIC PANEL WITH GFR
CO2: 30 mEq/L (ref 19–32)
Calcium: 9.6 mg/dL (ref 8.4–10.5)
Chloride: 101 mEq/L (ref 96–112)
Creat: 0.71 mg/dL (ref 0.50–1.10)
Glucose, Bld: 79 mg/dL (ref 70–99)
Potassium: 4.6 mEq/L (ref 3.5–5.3)
Sodium: 137 mEq/L (ref 135–145)

## 2013-07-17 LAB — SEDIMENTATION RATE: Sed Rate: 9 mm/hr (ref 0–22)

## 2013-07-17 SURGERY — CHEILECTOMY
Anesthesia: General | Site: Foot | Laterality: Right

## 2013-07-17 MED ORDER — AMOXICILLIN 500 MG PO CAPS: 500.0000 mg | ORAL_CAPSULE | Freq: Three times a day (TID) | ORAL | Status: DC

## 2013-07-17 NOTE — Progress Notes (Signed)
Subjective:    Patient ID: Shelly [name], female    DOB: June 23, 1971, 42 y.o.   MRN: [id number]  HPI  42 y.o. female.who underwent right great toe chilectomy 1 month ago (Dr. Eulah Pont). Had stitches removed post op week 2 - 3, then wound began to dehisce. On 11/17, pt began to have fever/chills and generalized weakness/not feeling well. Came to ER 11/18 fevers,with what turns out to have been toxic shock. She underwent Incision and debridement by Dr. Eulah Pont of her toe and he debris did a purulent area of air. Cultures that had been taken from pus emanating from the toe ultimately grew group A streptococci. While the patient was being closed in the operating room she developed blistering erythema at this lesions that began to march up the side of her leg. As mentioned she was hypotensive and there was concern for toxic shock versus septic shock from aggressive infection. She was worked up aggressively in the hospital with an MRI of the foot which failed to show any evidence of abscess or osteomyelitis. She was treated with Zyvox as an inpatient and then has now completed additional antibiotics to Zyvox as an outpatient. Her lesions around her foot progressed on and became more purplish and had appearance as if there had been some bleeding into some of these lesions she initially had also classic findings of a strawberry tongue and now is finally had some desquamation of some of her skin on her hands. She had some degree of acute heart failure thought to be do to her systemic inflammatory response also with a small pericardial effusion was also seen by Dr. Serena Croissant with cardiology. She has in general felt much better since leaving the hospital. She has noted a small area adjacent to the sutures where there was apparently some purulence and began to emerge approximately 5 days ago and subsided approximately one to 2 days ago. She has not yet seen her orthopedic surgeon in followup and has an appointment on  Tuesday. She has been also suffering from some chronic lightheadedness ever since leaving the hospital.    Review of Systems  Constitutional: Positive for activity change and fatigue. Negative for fever, chills, diaphoresis, appetite change and unexpected weight change.  HENT: Negative for congestion, rhinorrhea, sinus pressure, sneezing, sore throat and trouble swallowing.   Eyes: Negative for photophobia and visual disturbance.  Respiratory: Negative for cough, chest tightness, shortness of breath, wheezing and stridor.   Cardiovascular: Negative for chest pain, palpitations and leg swelling.  Gastrointestinal: Negative for nausea, vomiting, abdominal pain, diarrhea, constipation, blood in stool, abdominal distention and anal bleeding.  Genitourinary: Negative for dysuria, hematuria, flank pain and difficulty urinating.  Musculoskeletal: Negative for arthralgias, back pain, gait problem, joint swelling and myalgias.  Skin: Positive for color change, rash and wound. Negative for pallor.  Neurological: Positive for light-headedness. Negative for dizziness, tremors and weakness.  Hematological: Negative for adenopathy. Does not bruise/bleed easily.  Psychiatric/Behavioral: Negative for behavioral problems, confusion, sleep disturbance, dysphoric mood, decreased concentration and agitation.       Objective:   Physical Exam  Constitutional: She is oriented to person, place, and time. She appears well-developed and well-nourished. No distress.  HENT:  Head: Normocephalic and atraumatic.  Mouth/Throat: Oropharynx is clear and moist. No oropharyngeal exudate.  Eyes: Conjunctivae and EOM are normal.  Neck: Normal range of motion. Neck supple. No JVD present.  Cardiovascular: Normal rate, regular rhythm and normal heart sounds.  Exam reveals no friction rub.  No murmur heard. Pulmonary/Chest: Effort normal. No respiratory distress. She has no wheezes.  Abdominal: She exhibits no distension.    Musculoskeletal: She exhibits no edema and no tenderness.  Neurological: She is alert and oriented to person, place, and time. She exhibits normal muscle tone. Coordination normal.  Skin: Skin is warm and dry. She is not diaphoretic. No pallor.     Psychiatric: She has a normal mood and affect. Her behavior is normal. Judgment and thought content normal.                  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 Her toxic shock seems to have completely resolved the lesions on her skin I think are going through progression. I am quite concerned however there was some purulence coming from an area adjacent to her wound. I will place her on oral amoxicillin 500 mg 3 times daily for now but it is imperative that she follow up closely with her surgeon Dr. Mckinley Jewel. She has a followup with him on Tuesday and I put in a phone call to him today although he is currently in the operating room to 245 today.  Certainly if there is still active purulent infection with group A strep in this lesion I would like it to be explored and cleaned out thoroughly to avoid any risk of her suffering a relapse of her toxic shock response although I never seen such a case I would worry about it and also certainly worry about the potential for progression of infection if an undrained nidus is not surgically attended to.  I have given her a 10 day supply of amoxicillin and will have to have her seen back in clinic in the next 2 weeks as well.   I spent greater than 45 minutes with the patient including greater than 50% of time in face to face counsel of the patient and in coordination of their care.   #2 Dizziness: Former the setting vital signs which show that her blood pressure did not drop but her pulse did increase by 20-30 points. Will check CBC and a metabolic panel today. Certainly she could be anemic or thrombocytopenic from her Zyvox therapy. She  states she is trying to maintain adequate hydration.  #3 need for influenza vaccination we'll give a flu shot

## 2013-07-17 NOTE — Telephone Encounter (Signed)
Patient had questions after her office visit today, mostly pertaining to what she should do if the surgeon does not want to further explore the pus-like drainage from her foot.  RN spoke with Dr. Daiva Eves.  He did review the MRI reports (patient wanted to be sure, as she accessed it on mychart and was concerned) and has spoken to the patient's surgeon personally.  RN advised the patient to go ahead and start the antibiotic and continue to watch her foot for drainage over the weekend.  She has follow up with the surgeon on Tuesday.  She will call back for further advice if she has questions after that visit. Andree Coss, RN

## 2013-07-22 ENCOUNTER — Encounter: Payer: Self-pay | Admitting: Infectious Disease

## 2013-07-22 ENCOUNTER — Inpatient Hospital Stay (HOSPITAL_COMMUNITY): Admission: RE | Admit: 2013-07-22 | Payer: BC Managed Care – PPO | Source: Ambulatory Visit

## 2013-07-22 ENCOUNTER — Telehealth: Payer: Self-pay | Admitting: *Deleted

## 2013-07-22 ENCOUNTER — Other Ambulatory Visit (HOSPITAL_COMMUNITY): Payer: Self-pay | Admitting: Orthopedic Surgery

## 2013-07-22 DIAGNOSIS — L02619 Cutaneous abscess of unspecified foot: Secondary | ICD-10-CM

## 2013-07-22 NOTE — Telephone Encounter (Signed)
The patient sent the following question via myChart.  Please advise. Andree Coss, RN   "I wanted to check in with you about my visit to Dr. Eulah Pont today. When I was in to see you on 12/4, you thought that I needed to have my wound reopened and cleaned again. When I saw Dr. Eulah Pont today he said he disagreed, that he thought it looked good, I looked better, and that my lab work was steadily improving. He felt sure all infection was gone. I requested another mri. Is this satisfactory?"

## 2013-07-23 NOTE — Telephone Encounter (Signed)
I heard the MRI is going to be done this week, this will give [place] more information and inform need for surgery. I discussed case with Eulah Pont at 815 this am. Please have her let me know when the MRI has been done so I can review it

## 2013-07-24 ENCOUNTER — Ambulatory Visit (HOSPITAL_COMMUNITY)
Admission: RE | Admit: 2013-07-24 | Discharge: 2013-07-24 | Disposition: A | Discharge status: Home / Self Care | Payer: BC Managed Care – PPO | Source: Ambulatory Visit | Attending: Orthopedic Surgery | Admitting: Orthopedic Surgery

## 2013-07-24 DIAGNOSIS — Y838 Other surgical procedures as the cause of abnormal reaction of the patient, or of later complication, without mention of misadventure at the time of the procedure: Secondary | ICD-10-CM | POA: Insufficient documentation

## 2013-07-24 DIAGNOSIS — R609 Edema, unspecified: Secondary | ICD-10-CM | POA: Insufficient documentation

## 2013-07-24 DIAGNOSIS — M2020 Hallux rigidus, unspecified foot: Secondary | ICD-10-CM | POA: Insufficient documentation

## 2013-07-24 DIAGNOSIS — L02619 Cutaneous abscess of unspecified foot: Secondary | ICD-10-CM

## 2013-07-24 DIAGNOSIS — T8131XA Disruption of external operation (surgical) wound, not elsewhere classified, initial encounter: Secondary | ICD-10-CM | POA: Insufficient documentation

## 2013-07-24 MED ORDER — GADOBENATE DIMEGLUMINE 529 MG/ML IV SOLN
12.0000 mL | Freq: Once | INTRAVENOUS | Status: AC | PRN
  Administered 2013-07-24: 12 mL via INTRAVENOUS

## 2013-07-28 ENCOUNTER — Encounter: Payer: Self-pay | Admitting: *Deleted

## 2013-07-31 ENCOUNTER — Ambulatory Visit (INDEPENDENT_AMBULATORY_CARE_PROVIDER_SITE_OTHER): Payer: BC Managed Care – PPO | Admitting: Infectious Disease

## 2013-07-31 ENCOUNTER — Encounter: Payer: Self-pay | Admitting: Infectious Disease

## 2013-07-31 ENCOUNTER — Ambulatory Visit
Admission: RE | Admit: 2013-07-31 | Discharge: 2013-07-31 | Disposition: A | Discharge status: Home / Self Care | Payer: BC Managed Care – PPO | Source: Ambulatory Visit | Attending: Infectious Disease | Admitting: Infectious Disease

## 2013-07-31 VITALS — BP 112/74 | HR 82 | Temp 97.6°F | Ht 66.0 in | Wt 147.0 lb

## 2013-07-31 DIAGNOSIS — L02419 Cutaneous abscess of limb, unspecified: Secondary | ICD-10-CM

## 2013-07-31 DIAGNOSIS — B950 Streptococcus, group A, as the cause of diseases classified elsewhere: Secondary | ICD-10-CM

## 2013-07-31 DIAGNOSIS — L02619 Cutaneous abscess of unspecified foot: Secondary | ICD-10-CM

## 2013-07-31 DIAGNOSIS — A483 Toxic shock syndrome: Secondary | ICD-10-CM

## 2013-07-31 DIAGNOSIS — L02611 Cutaneous abscess of right foot: Secondary | ICD-10-CM

## 2013-07-31 DIAGNOSIS — R222 Localized swelling, mass and lump, trunk: Secondary | ICD-10-CM

## 2013-07-31 DIAGNOSIS — R918 Other nonspecific abnormal finding of lung field: Secondary | ICD-10-CM

## 2013-07-31 DIAGNOSIS — I429 Cardiomyopathy, unspecified: Secondary | ICD-10-CM

## 2013-07-31 NOTE — Progress Notes (Signed)
Subjective:    Patient ID: Shelly [name], female    DOB: Apr 25, 1971, 42 y.o.   MRN: [id number]  HPI   42 y.o. female.who underwent right great toe chilectomy 1 month ago (Dr. Eulah Pont). Had stitches removed post op week 2 - 3, then wound began to dehisce. On 11/17, pt began to have fever/chills and generalized weakness/not feeling well. Came to ER 11/18 fevers,with what turns out to have been toxic shock. She underwent Incision and debridement by Dr. Eulah Pont of her toe and he debris did a purulent area of air. Cultures that had been taken from pus emanating from the toe ultimately grew group A streptococci. While the patient was being closed in the operating room she developed blistering erythema at this lesions that began to march up the side of her leg. As mentioned she was hypotensive and there was concern for toxic shock versus septic shock from aggressive infection. She was worked up aggressively in the hospital with an MRI of the foot which failed to show any evidence of abscess or osteomyelitis. She was treated with Zyvox as an inpatient and then has now completed additional antibiotics to Zyvox as an outpatient. Her lesions around her foot progressed on and became more purplish and had appearance as if there had been some bleeding into some of these lesions she initially had also classic findings of a strawberry tongue and now is finally had some desquamation of some of her skin on her hands. She had some degree of acute heart failure thought to be do to her systemic inflammatory response also with a small pericardial effusion was also seen by Dr. Serena Croissant with cardiology. She has in general felt much better since leaving the hospital. She has noted a small area adjacent to the sutures where there was apparently some purulence and began to emerge approximately 5 days ago and subsided approximately one to 2 days ago. She has not yet seen her orthopedic surgeon in followup and has an appointment on  Tuesday. She had been also suffering from some chronic lightheadedness ever since leaving the hospital.  I saw her  Post hospital DC and we checked orthostatics which were negative. I extended her abx with oral amoxicillin and we got MRI of the foot which showed NO persistent abscess and she was seen by Dr. Eulah Pont as well with whom we coordinated tests.  Since then the scabs have largely come off of her foot and edema is resolving. Area of desquamation on hands is resolved.   She asked about the ? Pulmonary nodules seen on CXR on 07/03/13 2 view.       Review of Systems  Constitutional: Positive for activity change and fatigue. Negative for fever, chills, diaphoresis, appetite change and unexpected weight change.  HENT: Negative for congestion, rhinorrhea, sinus pressure, sneezing, sore throat and trouble swallowing.   Eyes: Negative for photophobia and visual disturbance.  Respiratory: Negative for cough, chest tightness, shortness of breath, wheezing and stridor.   Cardiovascular: Negative for chest pain, palpitations and leg swelling.  Gastrointestinal: Negative for nausea, vomiting, abdominal pain, diarrhea, constipation, blood in stool, abdominal distention and anal bleeding.  Genitourinary: Negative for dysuria, hematuria, flank pain and difficulty urinating.  Musculoskeletal: Negative for arthralgias, back pain, gait problem, joint swelling and myalgias.  Skin: Positive for color change, rash and wound. Negative for pallor.  Neurological: Negative for dizziness, tremors, weakness and light-headedness.  Hematological: Negative for adenopathy. Does not bruise/bleed easily.  Psychiatric/Behavioral: Negative for behavioral problems, confusion,  sleep disturbance, dysphoric mood, decreased concentration and agitation.       Objective:   Physical Exam  Constitutional: She is oriented to person, place, and time. She appears well-developed and well-nourished. No distress.  HENT:  Head:  Normocephalic and atraumatic.  Mouth/Throat: Oropharynx is clear and moist. No oropharyngeal exudate.  Eyes: Conjunctivae and EOM are normal.  Neck: Normal range of motion. Neck supple. No JVD present.  Cardiovascular: Normal rate, regular rhythm and normal heart sounds.  Exam reveals no friction rub.   No murmur heard. Pulmonary/Chest: Effort normal. No respiratory distress. She has no wheezes.  Abdominal: She exhibits no distension.  Musculoskeletal: She exhibits no edema and no tenderness.  Neurological: She is alert and oriented to person, place, and time. She exhibits normal muscle tone. Coordination normal.  Skin: Skin is warm and dry. She is not diaphoretic. No pallor.     Psychiatric: She has a normal mood and affect. Her behavior is normal. Judgment and thought content normal.                      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  --this has resolved and she is doing great without antibiotics  #2 Dizziness: resolved  #3 Lung nodules: I reviewed with films with Radiology and they recommended getting repeat CXR Candis Schatz, MD) so will get these. I suspect this is all pulmonary edema but will workup nodules if she reallly has them  I spent greater than 25 minutes with the patient including greater than 50% of time in face to face counsel of the patient and in coordination of their care.   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

## 2013-08-04 ENCOUNTER — Ambulatory Visit (INDEPENDENT_AMBULATORY_CARE_PROVIDER_SITE_OTHER): Payer: BC Managed Care – PPO | Admitting: Cardiology

## 2013-08-04 ENCOUNTER — Encounter: Payer: Self-pay | Admitting: Cardiology

## 2013-08-04 VITALS — BP 106/60 | HR 90 | Ht 65.0 in | Wt 146.0 lb

## 2013-08-04 DIAGNOSIS — I420 Dilated cardiomyopathy: Secondary | ICD-10-CM

## 2013-08-04 DIAGNOSIS — I517 Cardiomegaly: Secondary | ICD-10-CM

## 2013-08-04 DIAGNOSIS — I309 Acute pericarditis, unspecified: Secondary | ICD-10-CM

## 2013-08-04 DIAGNOSIS — I428 Other cardiomyopathies: Secondary | ICD-10-CM

## 2013-08-04 NOTE — Progress Notes (Signed)
[address] [place], [place]  [address] Phone: [phone] Fax:  [phone]  Date:  08/04/2013   ID:  Shelly [name], DOB 25-Sep-1970, MRN [id number]  PCP:  Hannah Beat, MD   History of Present Illness: Shelly [name] is a 42 y.o. female with group A strep infection of foot resulting in septicemia and a decreased ejection fraction on echocardiogram of approximately 45% with mildly dilated left ventricle, diffuse hypokinesia.  She had presumed pericarditis and was given ibuprofen for 7 days.  She is no longer having chest pain. She did note that sometimes prior to exercise she is to have a sensation like she needs a taken a deeper breath. This is been present for quite some time. Currently she is not vigorously exercising because of her foot infection.  Wt Readings from Last 3 Encounters:  08/04/13 146 lb (66.225 kg)  07/31/13 147 lb (66.679 kg)  07/17/13 145 lb 12 oz (66.112 kg)     Past Medical History  Diagnosis Date  . GERD (gastroesophageal reflux disease)   . Medical history non-contributory   . Toxic shock   . Toxic shock   . Group A streptococcal infection   . Pericardial effusion   . Secondary cardiomyopathy     Past Surgical History  Procedure Laterality Date  . Cesarean section  2008    complete placenta pervia, hemmorage  . Knee surgery  [phone]    7 surgeries left knee(2 lateral releases, 1 tibial tubercle oxtomy, 1 patellectomy)  . Cervical fusion  2011  . Cheilectomy Left 05/01/2013    Procedure: LEFT GREAT TOE HALLUX RIGIDUS WITH CHEILECTOMY 1ST METATARSOPHALANGEAL;  Surgeon: Loreta Ave, MD;  Location: [facility];  Service: Orthopedics;  Laterality: Left;  . Carpal tunnel release Left   . Cheilectomy Right 06/02/2013    procedule:right great toe chielectomy  . I&d extremity Right 07/02/2013    Procedure: IRRIGATION AND DEBRIDEMENT RIGHT 1ST Metaphalangeal JOINT;  Surgeon: Loreta Ave, MD;  Location: [facility] OR;   Service: Orthopedics;  Laterality: Right;    Current Outpatient Prescriptions  Medication Sig Dispense Refill  . acetaminophen (TYLENOL) 325 MG tablet Take 2 tablets (650 mg total) by mouth every 4 (four) hours as needed for fever or mild pain.      Marland Kitchen ibuprofen (ADVIL,MOTRIN) 600 MG tablet Take 1 tablet (600 mg total) by mouth 3 (three) times daily.  15 tablet  0  . Multiple Vitamin (MULTIVITAMIN) tablet Take 1 tablet by mouth daily.         No current facility-administered medications for this visit.    Allergies:    Allergies  Allergen Reactions  . Dilaudid [Hydromorphone Hcl]   . Codeine     REACTION: Hives    Social History:  The patient  reports that she quit smoking about 18 years ago. She does not have any smokeless tobacco history on file. She reports that she does not drink alcohol or use illicit drugs.   ROS:  Please see the history of present illness.   No syncope, no bleeding, no strokelike symptoms, no significant shortness of breath   PHYSICAL EXAM: VS:  BP 106/60  Pulse 90  Ht 5\' 5"  (1.651 m)  Wt 146 lb (66.225 kg)  BMI 24.30 kg/m2  SpO2 99% Well nourished, well developed, in no acute distress HEENT: normal Neck: no JVD Cardiac:  normal S1, S2; RRR; no murmur Lungs:  clear to auscultation bilaterally, no wheezing,  rhonchi or rales Abd: soft, nontender, no hepatomegaly Ext: no edema Skin: warm and dry Neuro: no focal abnormalities noted  EKG:  None today  ASSESSMENT AND PLAN:  1. Cardiomyopathy-felt to be secondary to septicemia in the setting of foot infection. Last echocardiogram on 07/04/13 showed decreased ejection fraction of 45%. I will repeat echocardiogram to ensure that ejection fraction has improved. If not, we will sit down and discuss further management. If it has improved, she can come back and see me on as-needed basis. 2. Group A strep infection-foot-infectious disease notes reviewed. She is very Adult nurse of team  work. 3. Pericarditis-improved with ibuprofen. Left pleural effusion has resolved. Chest x-ray results reviewed from 07/31/13.  Signed, Donato Schultz, MD [facility]  08/04/2013 11:54 AM

## 2013-08-04 NOTE — Patient Instructions (Signed)
Your physician recommends that you continue on your current medications as directed. Please refer to the Current Medication list given to you today.  Your physician has requested that you have an echocardiogram. Echocardiography is a painless test that uses sound waves to create images of your heart. It provides your doctor with information about the size and shape of your heart and how well your heart's chambers and valves are working. This procedure takes approximately one hour. There are no restrictions for this procedure.   Your physician recommends that you schedule a follow-up appointment as needed  

## 2013-08-12 ENCOUNTER — Ambulatory Visit (HOSPITAL_COMMUNITY): Payer: BC Managed Care – PPO | Attending: Cardiovascular Disease | Admitting: Radiology

## 2013-08-12 ENCOUNTER — Encounter: Payer: Self-pay | Admitting: Cardiovascular Disease

## 2013-08-12 DIAGNOSIS — I079 Rheumatic tricuspid valve disease, unspecified: Secondary | ICD-10-CM | POA: Insufficient documentation

## 2013-08-12 DIAGNOSIS — I059 Rheumatic mitral valve disease, unspecified: Secondary | ICD-10-CM | POA: Insufficient documentation

## 2013-08-12 DIAGNOSIS — I379 Nonrheumatic pulmonary valve disorder, unspecified: Secondary | ICD-10-CM | POA: Insufficient documentation

## 2013-08-12 DIAGNOSIS — I420 Dilated cardiomyopathy: Secondary | ICD-10-CM

## 2013-08-12 DIAGNOSIS — I428 Other cardiomyopathies: Secondary | ICD-10-CM | POA: Insufficient documentation

## 2013-08-12 NOTE — Progress Notes (Signed)
Echocardiogram performed.  

## 2013-08-19 ENCOUNTER — Telehealth: Payer: Self-pay | Admitting: Cardiology

## 2013-08-19 NOTE — Telephone Encounter (Signed)
New Message  Pt called for EKG results// please call

## 2013-08-20 ENCOUNTER — Telehealth: Payer: Self-pay | Admitting: *Deleted

## 2013-08-20 NOTE — Telephone Encounter (Signed)
lmtcb for echo results. Number provided 

## 2013-08-20 NOTE — Telephone Encounter (Signed)
Follow-up ° ° ° °Pt returning nurses phone call °

## 2013-08-21 NOTE — Telephone Encounter (Signed)
Patient aware of echo results,results sent to  PCP

## 2013-09-02 ENCOUNTER — Encounter: Payer: Self-pay | Admitting: Infectious Disease

## 2013-10-24 ENCOUNTER — Encounter: Payer: Self-pay | Admitting: Infectious Disease

## 2013-11-03 ENCOUNTER — Ambulatory Visit (INDEPENDENT_AMBULATORY_CARE_PROVIDER_SITE_OTHER): Payer: BC Managed Care – PPO | Admitting: Infectious Disease

## 2013-11-03 ENCOUNTER — Encounter: Payer: Self-pay | Admitting: Infectious Disease

## 2013-11-03 VITALS — BP 122/77 | HR 66 | Temp 97.9°F | Ht 66.0 in | Wt 157.0 lb

## 2013-11-03 DIAGNOSIS — A483 Toxic shock syndrome: Secondary | ICD-10-CM

## 2013-11-03 DIAGNOSIS — B999 Unspecified infectious disease: Secondary | ICD-10-CM

## 2013-11-03 DIAGNOSIS — R911 Solitary pulmonary nodule: Secondary | ICD-10-CM

## 2013-11-03 DIAGNOSIS — B950 Streptococcus, group A, as the cause of diseases classified elsewhere: Secondary | ICD-10-CM

## 2013-11-03 DIAGNOSIS — I429 Cardiomyopathy, unspecified: Secondary | ICD-10-CM

## 2013-11-03 MED ORDER — CHLORHEXIDINE GLUCONATE 4 % EX LIQD: Freq: Every day | CUTANEOUS | Status: DC | PRN

## 2013-11-03 MED ORDER — MUPIROCIN 2 % EX OINT: 1.0000 "application " | TOPICAL_OINTMENT | Freq: Two times a day (BID) | CUTANEOUS | Status: DC

## 2013-11-03 NOTE — Progress Notes (Signed)
Subjective:    Patient ID: Shelly [name], female    DOB: [birth year], 43 y.o.   MRN: [id number]  HPI   43 y.o. female.who underwent right great toe chilectomy 1 month ago (Dr. Eulah Pont). Had stitches removed post op week 2 - 3, then wound began to dehisce. On 11/17, pt began to have fever/chills and generalized weakness/not feeling well. Came to ER 11/18 fevers,with what turns out to have been toxic shock. She underwent Incision and debridement by Dr. Eulah Pont of her toe and he debris did a purulent area of air. Cultures that had been taken from pus emanating from the toe ultimately grew group A streptococci. While the patient was being closed in the operating room she developed blistering erythema at this lesions that began to march up the side of her leg. As mentioned she was hypotensive and there was concern for toxic shock versus septic shock from aggressive infection. She was worked up aggressively in the hospital with an MRI of the foot which failed to show any evidence of abscess or osteomyelitis. She was treated with Zyvox as an inpatient and then has now completed additional antibiotics to Zyvox as an outpatient. Her lesions around her foot progressed on and became more purplish and had appearance as if there had been some bleeding into some of these lesions she initially had also classic findings of a strawberry tongue and now is finally had some desquamation of some of her skin on her hands. She had some degree of acute heart failure thought to be do to her systemic inflammatory response also with a small pericardial effusion was also seen by Dr. Serena Croissant with cardiology. She has in general felt much better since leaving the hospital. She has noted a small area adjacent to the sutures where there was apparently some purulence and began to emerge approximately 5 days ago and subsided approximately one to 2 days ago. She has not yet seen her orthopedic surgeon in followup and has an appointment on  Tuesday. She had been also suffering from some chronic lightheadedness ever since leaving the hospital.  I saw her  Post hospital DC and we checked orthostatics which were negative. I extended her abx with oral amoxicillin and we got MRI of the foot which showed NO persistent abscess and she was seen by Dr. Eulah Pont as well with whom we coordinated tests.  I last saw her in December and at that time she was doing well. She is for for episodes of apparent streptococcal pharyngitis. She states that she had fevers and a sore throat he was seen by her primary care physician who tested her for group A strep the rapid strep testing and this test was positive. She was initially given a ten-day course of amoxicillin with immediate improvement in her symptoms. Unfortunately a proximal week or 2 after cessation of the amoxicillin she appears recurrence of sore throat and fever again tested positive for group A strep. She was again given a ten-day course of amoxicillin. She again felt better but again 1-2-3 weeks later she then had sore throat fever again diagnosis strep throat this time given clindamycin for 10 days. Again had another recurrence was given clindamycin again.  Of note there've been several family members who also been suffering from soft tissue infections and 1 with a third episode of group A strep pharyngitis. There is one child with toe infection 1 and had her fingernail infection. 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 She is being considered for elective tonsillectomy by ear nose and throat surgeon to prevent recurrences of her group A strep infection. However she had wished for another opinion here and her surgeon wanted to be reevaluated here in the infectious disease clinic prior to undergoing such surgery.  At this point in  time the patient and to her 3 children have been coming to infections that may have been due to group A strep her husband and her oldest child has not had a recent echo showed evidence of strep pharyngitis or a soft tissue infection.  This patient herself did not suffer recurrent infections as a child. Reviewed various tests he could do to assay her immune systems function that we can proceed with such tests. Also think we should try to perform a decolonization regimen at home against group A strep in case there several members of the family who are colonized. Certainly a tonsillectomy may ultimately be required for this specific patient, but it would be a bit unusual to see an adult who would require such surgery. She has not been found to have a paratonsillar abscess but no imaging has been done to date.    Review of Systems  Constitutional: Negative for fever, chills, diaphoresis, appetite change and unexpected weight change.  HENT: Negative for congestion, rhinorrhea, sinus pressure, sneezing, sore throat and trouble swallowing.   Eyes: Negative for photophobia and visual disturbance.  Respiratory: Negative for cough, chest tightness, shortness of breath, wheezing and stridor.   Cardiovascular: Negative for chest pain, palpitations and leg swelling.  Gastrointestinal: Negative for nausea, vomiting, abdominal pain, diarrhea, constipation, blood in stool, abdominal distention and anal bleeding.  Genitourinary: Negative for dysuria, hematuria, flank pain and difficulty urinating.  Musculoskeletal: Negative for arthralgias, back pain, gait problem, joint swelling and myalgias.  Skin: Negative for pallor.  Neurological: Negative for dizziness, tremors, weakness and light-headedness.  Hematological: Negative for adenopathy. Does not bruise/bleed easily.  Psychiatric/Behavioral: Negative for behavioral problems, confusion, sleep disturbance, dysphoric mood, decreased concentration and agitation.        Objective:   Physical Exam  Constitutional: She is oriented to person, place, and time. She appears well-developed and well-nourished. No distress.  HENT:  Head: Normocephalic and atraumatic.  Mouth/Throat: Uvula is midline. Posterior oropharyngeal edema present. No oropharyngeal exudate, posterior oropharyngeal erythema or tonsillar abscesses.  Eyes: Conjunctivae and EOM are normal.  Neck: Normal range of motion. Neck supple. No JVD present.  Cardiovascular: Normal rate, regular rhythm and normal heart sounds.  Exam reveals no friction rub.   No murmur heard. Pulmonary/Chest: Effort normal. No respiratory distress. She has no wheezes.  Abdominal: She exhibits no distension.  Musculoskeletal: She exhibits no edema and no tenderness.  Neurological: She is alert and oriented to person, place, and time. She exhibits normal muscle tone. Coordination normal.  Skin: Skin is warm and dry. She is not diaphoretic. No pallor.     Psychiatric: She has a normal mood and affect. Her behavior is normal. Judgment and thought content normal.   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  We will try to see if the decolonization regimen and the family stops the recurrent soft tissue infections and several children, along with mom's recurrent infections in the oropharynx.  Is possible that some of these tests could be false positives on mom if she was suffering from some other viral infection and is simply carrying the group A strep.  We'll assay her immune function by checking serum immunoglobulins complement and neutrophil burst test.  I think we should consider getting a CT of her neck with contrast as well which did not bring up with her  during the visit but which we can call her about see if she would be agreeable to having CT imaging set up to have violated for peritonsillar abscess.  Certainly very curious an unusual story . Tonsillectomy initially done for children with recurrent group A strep infection but not as much in adults absent recurrent peritonsillar abscess.  I spent greater than 40 minutes with the patient including greater than 50% of time in face to face counsel of the patient and in coordination of their care.  #2 GAS with toxic shock in foot: resolved  #3 Uterine prolapse consider Hysterectomy vs pesary. Given that pesary will not be with highly absorbent material I DO NOT think we are looking at high risk for GAS and Toxic shock associated with that    #4 Lung nodules: repeat films were normal was likely pulm edema  I

## 2013-11-05 LAB — COMPLEMENT, TOTAL: Compl, Total (CH50): 47 U/mL (ref 31–60)

## 2014-01-06 ENCOUNTER — Ambulatory Visit: Payer: BC Managed Care – PPO | Admitting: Infectious Disease

## 2014-05-29 ENCOUNTER — Other Ambulatory Visit: Payer: Self-pay

## 2015-12-28 DIAGNOSIS — R300 Dysuria: Secondary | ICD-10-CM | POA: Diagnosis not present

## 2015-12-28 DIAGNOSIS — F4323 Adjustment disorder with mixed anxiety and depressed mood: Secondary | ICD-10-CM | POA: Diagnosis not present

## 2016-01-04 DIAGNOSIS — F4323 Adjustment disorder with mixed anxiety and depressed mood: Secondary | ICD-10-CM | POA: Diagnosis not present

## 2016-01-13 DIAGNOSIS — L290 Pruritus ani: Secondary | ICD-10-CM | POA: Diagnosis not present

## 2016-01-19 DIAGNOSIS — F4323 Adjustment disorder with mixed anxiety and depressed mood: Secondary | ICD-10-CM | POA: Diagnosis not present

## 2016-01-19 DIAGNOSIS — N951 Menopausal and female climacteric states: Secondary | ICD-10-CM | POA: Diagnosis not present

## 2016-01-26 DIAGNOSIS — F4323 Adjustment disorder with mixed anxiety and depressed mood: Secondary | ICD-10-CM | POA: Diagnosis not present

## 2016-02-02 DIAGNOSIS — F4323 Adjustment disorder with mixed anxiety and depressed mood: Secondary | ICD-10-CM | POA: Diagnosis not present

## 2016-02-09 DIAGNOSIS — F4323 Adjustment disorder with mixed anxiety and depressed mood: Secondary | ICD-10-CM | POA: Diagnosis not present

## 2016-02-22 DIAGNOSIS — N898 Other specified noninflammatory disorders of vagina: Secondary | ICD-10-CM | POA: Diagnosis not present

## 2016-02-22 DIAGNOSIS — R350 Frequency of micturition: Secondary | ICD-10-CM | POA: Diagnosis not present

## 2016-02-28 DIAGNOSIS — F4323 Adjustment disorder with mixed anxiety and depressed mood: Secondary | ICD-10-CM | POA: Diagnosis not present

## 2016-04-05 DIAGNOSIS — F4323 Adjustment disorder with mixed anxiety and depressed mood: Secondary | ICD-10-CM | POA: Diagnosis not present

## 2016-04-26 DIAGNOSIS — F4323 Adjustment disorder with mixed anxiety and depressed mood: Secondary | ICD-10-CM | POA: Diagnosis not present

## 2016-05-04 DIAGNOSIS — M79672 Pain in left foot: Secondary | ICD-10-CM | POA: Diagnosis not present

## 2016-05-04 DIAGNOSIS — M2022 Hallux rigidus, left foot: Secondary | ICD-10-CM | POA: Diagnosis not present

## 2016-05-04 DIAGNOSIS — M79671 Pain in right foot: Secondary | ICD-10-CM | POA: Diagnosis not present

## 2016-05-04 DIAGNOSIS — M2021 Hallux rigidus, right foot: Secondary | ICD-10-CM | POA: Diagnosis not present

## 2016-05-08 DIAGNOSIS — F4323 Adjustment disorder with mixed anxiety and depressed mood: Secondary | ICD-10-CM | POA: Diagnosis not present

## 2016-05-22 DIAGNOSIS — F4323 Adjustment disorder with mixed anxiety and depressed mood: Secondary | ICD-10-CM | POA: Diagnosis not present

## 2016-05-23 DIAGNOSIS — R002 Palpitations: Secondary | ICD-10-CM | POA: Diagnosis not present

## 2016-05-23 DIAGNOSIS — Z0000 Encounter for general adult medical examination without abnormal findings: Secondary | ICD-10-CM | POA: Diagnosis not present

## 2016-05-23 DIAGNOSIS — F419 Anxiety disorder, unspecified: Secondary | ICD-10-CM | POA: Diagnosis not present

## 2016-05-23 DIAGNOSIS — R5383 Other fatigue: Secondary | ICD-10-CM | POA: Diagnosis not present

## 2016-05-26 DIAGNOSIS — Z79890 Hormone replacement therapy (postmenopausal): Secondary | ICD-10-CM | POA: Diagnosis not present

## 2016-05-26 DIAGNOSIS — R5383 Other fatigue: Secondary | ICD-10-CM | POA: Diagnosis not present

## 2016-05-26 DIAGNOSIS — N926 Irregular menstruation, unspecified: Secondary | ICD-10-CM | POA: Diagnosis not present

## 2016-05-26 DIAGNOSIS — Z0000 Encounter for general adult medical examination without abnormal findings: Secondary | ICD-10-CM | POA: Diagnosis not present

## 2016-05-29 DIAGNOSIS — M9901 Segmental and somatic dysfunction of cervical region: Secondary | ICD-10-CM | POA: Diagnosis not present

## 2016-05-29 DIAGNOSIS — M9903 Segmental and somatic dysfunction of lumbar region: Secondary | ICD-10-CM | POA: Diagnosis not present

## 2016-05-29 DIAGNOSIS — M9905 Segmental and somatic dysfunction of pelvic region: Secondary | ICD-10-CM | POA: Diagnosis not present

## 2016-05-29 DIAGNOSIS — M9902 Segmental and somatic dysfunction of thoracic region: Secondary | ICD-10-CM | POA: Diagnosis not present

## 2016-06-23 DIAGNOSIS — N390 Urinary tract infection, site not specified: Secondary | ICD-10-CM | POA: Diagnosis not present

## 2016-08-16 DIAGNOSIS — Z9189 Other specified personal risk factors, not elsewhere classified: Secondary | ICD-10-CM | POA: Diagnosis not present

## 2016-08-16 DIAGNOSIS — N951 Menopausal and female climacteric states: Secondary | ICD-10-CM | POA: Diagnosis not present

## 2016-08-16 DIAGNOSIS — Z01419 Encounter for gynecological examination (general) (routine) without abnormal findings: Secondary | ICD-10-CM | POA: Diagnosis not present

## 2016-08-16 DIAGNOSIS — E2839 Other primary ovarian failure: Secondary | ICD-10-CM | POA: Diagnosis not present

## 2016-08-16 DIAGNOSIS — R635 Abnormal weight gain: Secondary | ICD-10-CM | POA: Diagnosis not present

## 2016-09-06 DIAGNOSIS — M546 Pain in thoracic spine: Secondary | ICD-10-CM | POA: Diagnosis not present

## 2016-09-06 DIAGNOSIS — M545 Low back pain: Secondary | ICD-10-CM | POA: Diagnosis not present

## 2016-09-06 DIAGNOSIS — M25551 Pain in right hip: Secondary | ICD-10-CM | POA: Diagnosis not present

## 2016-09-20 DIAGNOSIS — M545 Low back pain: Secondary | ICD-10-CM | POA: Diagnosis not present

## 2016-09-20 DIAGNOSIS — M546 Pain in thoracic spine: Secondary | ICD-10-CM | POA: Diagnosis not present

## 2016-09-20 DIAGNOSIS — M25551 Pain in right hip: Secondary | ICD-10-CM | POA: Diagnosis not present

## 2016-10-04 DIAGNOSIS — R5383 Other fatigue: Secondary | ICD-10-CM | POA: Diagnosis not present

## 2016-10-04 DIAGNOSIS — R635 Abnormal weight gain: Secondary | ICD-10-CM | POA: Diagnosis not present

## 2016-10-04 DIAGNOSIS — N951 Menopausal and female climacteric states: Secondary | ICD-10-CM | POA: Diagnosis not present

## 2016-10-10 DIAGNOSIS — M25551 Pain in right hip: Secondary | ICD-10-CM | POA: Diagnosis not present

## 2016-10-10 DIAGNOSIS — M546 Pain in thoracic spine: Secondary | ICD-10-CM | POA: Diagnosis not present

## 2016-10-10 DIAGNOSIS — M545 Low back pain: Secondary | ICD-10-CM | POA: Diagnosis not present

## 2016-11-02 DIAGNOSIS — R5383 Other fatigue: Secondary | ICD-10-CM | POA: Diagnosis not present

## 2016-11-02 DIAGNOSIS — R635 Abnormal weight gain: Secondary | ICD-10-CM | POA: Diagnosis not present

## 2016-11-02 DIAGNOSIS — Z780 Asymptomatic menopausal state: Secondary | ICD-10-CM | POA: Diagnosis not present

## 2016-11-13 DIAGNOSIS — M79672 Pain in left foot: Secondary | ICD-10-CM | POA: Diagnosis not present

## 2016-11-13 DIAGNOSIS — M2022 Hallux rigidus, left foot: Secondary | ICD-10-CM | POA: Diagnosis not present

## 2016-11-13 DIAGNOSIS — M79671 Pain in right foot: Secondary | ICD-10-CM | POA: Diagnosis not present

## 2016-11-17 ENCOUNTER — Other Ambulatory Visit: Payer: Self-pay | Admitting: Orthopedic Surgery

## 2016-12-04 DIAGNOSIS — M546 Pain in thoracic spine: Secondary | ICD-10-CM | POA: Diagnosis not present

## 2016-12-04 DIAGNOSIS — M25551 Pain in right hip: Secondary | ICD-10-CM | POA: Diagnosis not present

## 2016-12-04 DIAGNOSIS — M545 Low back pain: Secondary | ICD-10-CM | POA: Diagnosis not present

## 2016-12-11 DIAGNOSIS — G4700 Insomnia, unspecified: Secondary | ICD-10-CM | POA: Diagnosis not present

## 2016-12-11 DIAGNOSIS — N951 Menopausal and female climacteric states: Secondary | ICD-10-CM | POA: Diagnosis not present

## 2016-12-11 DIAGNOSIS — Z8349 Family history of other endocrine, nutritional and metabolic diseases: Secondary | ICD-10-CM | POA: Diagnosis not present

## 2016-12-18 DIAGNOSIS — M25551 Pain in right hip: Secondary | ICD-10-CM | POA: Diagnosis not present

## 2016-12-18 DIAGNOSIS — M545 Low back pain: Secondary | ICD-10-CM | POA: Diagnosis not present

## 2016-12-18 DIAGNOSIS — M546 Pain in thoracic spine: Secondary | ICD-10-CM | POA: Diagnosis not present

## 2016-12-28 ENCOUNTER — Encounter (HOSPITAL_BASED_OUTPATIENT_CLINIC_OR_DEPARTMENT_OTHER): Payer: Self-pay | Admitting: *Deleted

## 2017-01-04 ENCOUNTER — Ambulatory Visit (HOSPITAL_BASED_OUTPATIENT_CLINIC_OR_DEPARTMENT_OTHER)
Admission: RE | Admit: 2017-01-04 | Payer: BLUE CROSS/BLUE SHIELD | Source: Ambulatory Visit | Admitting: Orthopedic Surgery

## 2017-01-04 ENCOUNTER — Encounter (HOSPITAL_BASED_OUTPATIENT_CLINIC_OR_DEPARTMENT_OTHER): Admission: RE | Payer: Self-pay | Source: Ambulatory Visit

## 2017-01-04 SURGERY — CHEILECTOMY, GREAT TOE, WITH IMPLANT INSERTION
Anesthesia: General | Laterality: Left

## 2017-01-18 DIAGNOSIS — N951 Menopausal and female climacteric states: Secondary | ICD-10-CM | POA: Diagnosis not present

## 2017-01-18 DIAGNOSIS — R5383 Other fatigue: Secondary | ICD-10-CM | POA: Diagnosis not present

## 2017-01-18 DIAGNOSIS — R635 Abnormal weight gain: Secondary | ICD-10-CM | POA: Diagnosis not present

## 2017-01-19 DIAGNOSIS — N951 Menopausal and female climacteric states: Secondary | ICD-10-CM | POA: Diagnosis not present

## 2017-01-19 DIAGNOSIS — E559 Vitamin D deficiency, unspecified: Secondary | ICD-10-CM | POA: Diagnosis not present

## 2017-01-19 DIAGNOSIS — R635 Abnormal weight gain: Secondary | ICD-10-CM | POA: Diagnosis not present

## 2017-01-19 DIAGNOSIS — R5383 Other fatigue: Secondary | ICD-10-CM | POA: Diagnosis not present

## 2017-02-13 DIAGNOSIS — M25512 Pain in left shoulder: Secondary | ICD-10-CM | POA: Diagnosis not present

## 2017-02-21 DIAGNOSIS — N951 Menopausal and female climacteric states: Secondary | ICD-10-CM | POA: Diagnosis not present

## 2017-02-21 DIAGNOSIS — R5383 Other fatigue: Secondary | ICD-10-CM | POA: Diagnosis not present

## 2017-02-21 DIAGNOSIS — R635 Abnormal weight gain: Secondary | ICD-10-CM | POA: Diagnosis not present

## 2017-02-28 DIAGNOSIS — M545 Low back pain: Secondary | ICD-10-CM | POA: Diagnosis not present

## 2017-02-28 DIAGNOSIS — M546 Pain in thoracic spine: Secondary | ICD-10-CM | POA: Diagnosis not present

## 2017-02-28 DIAGNOSIS — M25551 Pain in right hip: Secondary | ICD-10-CM | POA: Diagnosis not present

## 2017-03-14 DIAGNOSIS — M545 Low back pain: Secondary | ICD-10-CM | POA: Diagnosis not present

## 2017-03-14 DIAGNOSIS — M546 Pain in thoracic spine: Secondary | ICD-10-CM | POA: Diagnosis not present

## 2017-03-14 DIAGNOSIS — M25551 Pain in right hip: Secondary | ICD-10-CM | POA: Diagnosis not present

## 2017-04-12 DIAGNOSIS — R6882 Decreased libido: Secondary | ICD-10-CM | POA: Diagnosis not present

## 2017-04-12 DIAGNOSIS — R5383 Other fatigue: Secondary | ICD-10-CM | POA: Diagnosis not present

## 2017-04-12 DIAGNOSIS — N951 Menopausal and female climacteric states: Secondary | ICD-10-CM | POA: Diagnosis not present

## 2017-05-03 DIAGNOSIS — Z8679 Personal history of other diseases of the circulatory system: Secondary | ICD-10-CM | POA: Diagnosis not present

## 2017-05-03 DIAGNOSIS — R5383 Other fatigue: Secondary | ICD-10-CM | POA: Diagnosis not present

## 2017-05-03 DIAGNOSIS — R0789 Other chest pain: Secondary | ICD-10-CM | POA: Diagnosis not present

## 2017-05-03 DIAGNOSIS — R6889 Other general symptoms and signs: Secondary | ICD-10-CM | POA: Diagnosis not present

## 2017-05-04 DIAGNOSIS — R0789 Other chest pain: Secondary | ICD-10-CM | POA: Diagnosis not present

## 2017-05-04 DIAGNOSIS — R5383 Other fatigue: Secondary | ICD-10-CM | POA: Diagnosis not present

## 2017-05-04 DIAGNOSIS — E7800 Pure hypercholesterolemia, unspecified: Secondary | ICD-10-CM | POA: Diagnosis not present

## 2017-05-09 ENCOUNTER — Other Ambulatory Visit: Payer: Self-pay | Admitting: Internal Medicine

## 2017-05-09 DIAGNOSIS — R6889 Other general symptoms and signs: Secondary | ICD-10-CM

## 2017-05-09 DIAGNOSIS — R0789 Other chest pain: Secondary | ICD-10-CM

## 2017-05-09 DIAGNOSIS — Z8679 Personal history of other diseases of the circulatory system: Secondary | ICD-10-CM

## 2017-05-09 DIAGNOSIS — R5383 Other fatigue: Secondary | ICD-10-CM

## 2017-05-11 ENCOUNTER — Ambulatory Visit (HOSPITAL_COMMUNITY): Payer: BLUE CROSS/BLUE SHIELD | Attending: Cardiovascular Disease

## 2017-05-11 ENCOUNTER — Other Ambulatory Visit: Payer: Self-pay

## 2017-05-11 DIAGNOSIS — I071 Rheumatic tricuspid insufficiency: Secondary | ICD-10-CM | POA: Insufficient documentation

## 2017-05-11 DIAGNOSIS — R5383 Other fatigue: Secondary | ICD-10-CM | POA: Insufficient documentation

## 2017-05-11 DIAGNOSIS — Z8679 Personal history of other diseases of the circulatory system: Secondary | ICD-10-CM | POA: Insufficient documentation

## 2017-05-11 DIAGNOSIS — R0789 Other chest pain: Secondary | ICD-10-CM | POA: Diagnosis not present

## 2017-05-11 DIAGNOSIS — I420 Dilated cardiomyopathy: Secondary | ICD-10-CM | POA: Insufficient documentation

## 2017-05-11 DIAGNOSIS — R6889 Other general symptoms and signs: Secondary | ICD-10-CM | POA: Diagnosis not present

## 2017-05-15 ENCOUNTER — Encounter: Payer: Self-pay | Admitting: Cardiology

## 2017-05-15 ENCOUNTER — Ambulatory Visit (INDEPENDENT_AMBULATORY_CARE_PROVIDER_SITE_OTHER): Payer: BLUE CROSS/BLUE SHIELD | Admitting: Cardiology

## 2017-05-15 VITALS — BP 120/74 | HR 64 | Ht 66.0 in | Wt 162.6 lb

## 2017-05-15 DIAGNOSIS — R079 Chest pain, unspecified: Secondary | ICD-10-CM

## 2017-05-15 NOTE — Progress Notes (Signed)
[address] [place], [place]  [address] Phone: [phone] Fax:  [phone]  Date:  05/15/2017   ID:  Shelly [name], DOB [birth year], MRN [id number]  PCP:  Merri Brunette, MD   History of Present Illness: Shelly [name] is a 46 y.o. female with group A strep infection of foot resulting in septicemia and a decreased ejection fraction on echocardiogram of approximately 45% with mildly dilated left ventricle, diffuse hypokinesia here for cardiovascular evaluation at the request of Dr. Merri Brunette.  Has been experiencing chest tightness, has trouble catching her breath when her heart rate goes above 130 bpm. She stops exercising and resolves after 3 or 4 minutes. Personal trainer by trade. Hard to breath. Happens when mad as well.   It has been greater than 4 year since our last visit.  Previously, She had presumed pericarditis and was given ibuprofen for 7 days.  Mother - side MI's  Wt Readings from Last 3 Encounters:  05/15/17 162 lb 9.6 oz (73.8 kg)  11/03/13 157 lb (71.2 kg)  08/04/13 146 lb (66.2 kg)     Past Medical History:  Diagnosis Date  . GERD (gastroesophageal reflux disease)   . Group A streptococcal infection   . Pericardial effusion   . Secondary cardiomyopathy (HCC)   . Toxic shock (HCC)   . Toxic shock [facility])     Past Surgical History:  Procedure Laterality Date  . ANTERIOR CERVICAL DECOMP/DISCECTOMY FUSION  11/18/2009   C5-7  . CARPAL TUNNEL RELEASE Left   . CESAREAN SECTION  2008   complete placenta pervia, hemmorage  . CHEILECTOMY Left 05/01/2013   Procedure: LEFT GREAT TOE HALLUX RIGIDUS WITH CHEILECTOMY 1ST METATARSOPHALANGEAL;  Surgeon: Loreta Ave, MD;  Location: [facility];  Service: Orthopedics;  Laterality: Left;  . CHEILECTOMY Right 06/02/2013   procedule:right great toe chielectomy  . I&D EXTREMITY Right 07/02/2013   Procedure: IRRIGATION AND DEBRIDEMENT RIGHT 1ST Metaphalangeal JOINT;  Surgeon: Loreta Ave, MD;  Location: [facility] OR;  Service: Orthopedics;  Laterality: Right;  . KNEE SURGERY  [phone]   7 surgeries left knee(2 lateral releases, 1 tibial tubercle oxtomy, 1 patellectomy)    Current Outpatient Prescriptions  Medication Sig Dispense Refill  . estradiol (VIVELLE-DOT) 0.025 MG/24HR APPLY NEW PATCH TWICE A WEEK REMOVE OLD PATCH PRIOR TO APPLYING NEW ONE  2  . ibuprofen (ADVIL,MOTRIN) 600 MG tablet Take 1 tablet (600 mg total) by mouth 3 (three) times daily. 15 tablet 0  . progesterone (PROMETRIUM) 100 MG capsule Take 100 mg by mouth at bedtime.  3  . testosterone (ANDROGEL) 50 MG/5GM (1%) GEL Place 5 g onto the skin 2 (two) times daily.     No current facility-administered medications for this visit.     Allergies:    Allergies  Allergen Reactions  . Dilaudid [Hydromorphone Hcl]   . Codeine     REACTION: Hives    Social History:  The patient  reports that she quit smoking about 22 years ago. She has never used smokeless tobacco. She reports that she does not drink alcohol or use drugs.   ROS:  Please see the history of present illness.   No syncope, no bleeding, no strokelike symptoms, no significant shortness of breath   PHYSICAL EXAM: VS:  BP 120/74   Pulse 64   Ht  (1.676 m)   Wt 162 lb 9.6 oz (73.8 kg)   LMP  (LMP Unknown)  BMI 26.24 kg/m  GEN: Well nourished, well developed, in no acute distress  HEENT: normal  Neck: no JVD, carotid bruits, or masses Cardiac: RRR; no murmurs, rubs, or gallops,no edema  Respiratory:  clear to auscultation bilaterally, normal work of breathing GI: soft, nontender, nondistended, + BS MS: no deformity or atrophy  Skin: warm and dry, no rash Neuro:  Alert and Oriented x 3, Strength and sensation are intact Psych: euthymic mood, full affect   EKG:  None today. Prior personally reviewed demonstrates sinus rhythm heart rate 65 with no other abnormalities performed on 05/03/17. Personally viewed.  ECHO: 05/11/17  - Left  ventricle: The cavity size was normal. Systolic function was   normal. The estimated ejection fraction was in the range of 55%   to 60%. Wall motion was normal; there were no regional wall   motion abnormalities. Left ventricular diastolic function   parameters were normal. - Atrial septum: No defect or patent foramen ovale was identified.  Labs: Total cholesterol 242, HDL 83, LDL 142, triglycerides 76, hemoglobin 13.3, creatinine 0.9 on 11/02/16.  ASSESSMENT AND PLAN:  1. Chest pain-she is having exertional chest discomfort with vigorous exercise that is relieved with rest. Possibly concerning for coronary artery stenosis. I would like to check a coronary artery CT scan to evaluate her anatomy. Recent echocardiogram was reassuring showing normal EF. 2. Cardiomyopathy-resolved-previously felt to be secondary to septicemia in the setting of foot infection. Last echocardiogram on 07/04/13 showed decreased ejection fraction of 45%. 3. Group A strep infection-foot-infectious disease notes reviewed previously. No further infections. 4. History of presumed Pericarditis-improved with ibuprofen. Left pleural effusion has resolved. Chest x-ray results reviewed from 07/31/13. No further occurrence.  Signed, Donato Schultz, MD [facility]  05/15/2017 4:37 PM

## 2017-05-15 NOTE — Patient Instructions (Addendum)
Medication Instructions:  The current medical regimen is effective;  continue present plan and medications.  Testing/Procedures: Your physician has requested that you have cardiac CT. Cardiac computed tomography (CT) is a painless test that uses an x-ray machine to take clear, detailed pictures of your heart.  This will be completed at [facility].  Follow-Up: Follow up as needed after testing.  Thank you for choosing [facility]!!

## 2017-05-23 DIAGNOSIS — E559 Vitamin D deficiency, unspecified: Secondary | ICD-10-CM | POA: Diagnosis not present

## 2017-05-23 DIAGNOSIS — R5383 Other fatigue: Secondary | ICD-10-CM | POA: Diagnosis not present

## 2017-05-23 DIAGNOSIS — R6882 Decreased libido: Secondary | ICD-10-CM | POA: Diagnosis not present

## 2017-05-23 DIAGNOSIS — N951 Menopausal and female climacteric states: Secondary | ICD-10-CM | POA: Diagnosis not present

## 2017-05-23 DIAGNOSIS — R635 Abnormal weight gain: Secondary | ICD-10-CM | POA: Diagnosis not present

## 2017-05-25 ENCOUNTER — Other Ambulatory Visit: Payer: Self-pay | Admitting: Orthopedic Surgery

## 2017-06-04 DIAGNOSIS — Z0000 Encounter for general adult medical examination without abnormal findings: Secondary | ICD-10-CM | POA: Diagnosis not present

## 2017-06-28 ENCOUNTER — Ambulatory Visit (HOSPITAL_BASED_OUTPATIENT_CLINIC_OR_DEPARTMENT_OTHER)
Admission: RE | Admit: 2017-06-28 | Payer: BLUE CROSS/BLUE SHIELD | Source: Ambulatory Visit | Admitting: Orthopedic Surgery

## 2017-06-28 ENCOUNTER — Encounter (HOSPITAL_BASED_OUTPATIENT_CLINIC_OR_DEPARTMENT_OTHER): Admission: RE | Payer: Self-pay | Source: Ambulatory Visit

## 2017-06-28 SURGERY — CHEILECTOMY, GREAT TOE, WITH IMPLANT INSERTION
Anesthesia: General | Laterality: Left

## 2017-07-12 ENCOUNTER — Encounter: Payer: Self-pay | Admitting: Cardiology

## 2017-08-20 DIAGNOSIS — N951 Menopausal and female climacteric states: Secondary | ICD-10-CM | POA: Diagnosis not present

## 2017-08-20 DIAGNOSIS — Z01419 Encounter for gynecological examination (general) (routine) without abnormal findings: Secondary | ICD-10-CM | POA: Diagnosis not present

## 2017-08-20 DIAGNOSIS — Z124 Encounter for screening for malignant neoplasm of cervix: Secondary | ICD-10-CM | POA: Diagnosis not present

## 2017-08-20 DIAGNOSIS — Z1231 Encounter for screening mammogram for malignant neoplasm of breast: Secondary | ICD-10-CM | POA: Diagnosis not present

## 2017-09-11 DIAGNOSIS — L290 Pruritus ani: Secondary | ICD-10-CM | POA: Diagnosis not present

## 2017-09-19 DIAGNOSIS — R5383 Other fatigue: Secondary | ICD-10-CM | POA: Diagnosis not present

## 2017-09-19 DIAGNOSIS — R635 Abnormal weight gain: Secondary | ICD-10-CM | POA: Diagnosis not present

## 2017-09-19 DIAGNOSIS — R6882 Decreased libido: Secondary | ICD-10-CM | POA: Diagnosis not present

## 2017-09-19 DIAGNOSIS — R7989 Other specified abnormal findings of blood chemistry: Secondary | ICD-10-CM | POA: Diagnosis not present

## 2017-09-19 DIAGNOSIS — E039 Hypothyroidism, unspecified: Secondary | ICD-10-CM | POA: Diagnosis not present

## 2017-10-01 DIAGNOSIS — Z6826 Body mass index (BMI) 26.0-26.9, adult: Secondary | ICD-10-CM | POA: Diagnosis not present

## 2017-10-01 DIAGNOSIS — N952 Postmenopausal atrophic vaginitis: Secondary | ICD-10-CM | POA: Diagnosis not present

## 2017-10-01 DIAGNOSIS — N950 Postmenopausal bleeding: Secondary | ICD-10-CM | POA: Diagnosis not present

## 2017-10-01 DIAGNOSIS — R635 Abnormal weight gain: Secondary | ICD-10-CM | POA: Diagnosis not present

## 2017-10-09 DIAGNOSIS — E8941 Symptomatic postprocedural ovarian failure: Secondary | ICD-10-CM | POA: Diagnosis not present

## 2017-10-09 DIAGNOSIS — N841 Polyp of cervix uteri: Secondary | ICD-10-CM | POA: Diagnosis not present

## 2017-10-09 DIAGNOSIS — N950 Postmenopausal bleeding: Secondary | ICD-10-CM | POA: Diagnosis not present

## 2017-10-12 DIAGNOSIS — N950 Postmenopausal bleeding: Secondary | ICD-10-CM | POA: Diagnosis not present

## 2017-10-12 DIAGNOSIS — R635 Abnormal weight gain: Secondary | ICD-10-CM | POA: Diagnosis not present

## 2017-10-12 DIAGNOSIS — E8941 Symptomatic postprocedural ovarian failure: Secondary | ICD-10-CM | POA: Diagnosis not present

## 2017-10-12 HISTORY — PX: HYSTEROSCOPY: SHX211

## 2017-10-15 DIAGNOSIS — N950 Postmenopausal bleeding: Secondary | ICD-10-CM | POA: Diagnosis not present

## 2017-11-02 DIAGNOSIS — M545 Low back pain: Secondary | ICD-10-CM | POA: Diagnosis not present

## 2017-11-09 DIAGNOSIS — M5416 Radiculopathy, lumbar region: Secondary | ICD-10-CM | POA: Diagnosis not present

## 2017-11-12 DIAGNOSIS — M5136 Other intervertebral disc degeneration, lumbar region: Secondary | ICD-10-CM | POA: Diagnosis not present

## 2017-11-27 DIAGNOSIS — N951 Menopausal and female climacteric states: Secondary | ICD-10-CM | POA: Diagnosis not present

## 2018-02-08 DIAGNOSIS — R635 Abnormal weight gain: Secondary | ICD-10-CM | POA: Diagnosis not present

## 2018-03-05 ENCOUNTER — Other Ambulatory Visit: Payer: Self-pay

## 2018-03-05 ENCOUNTER — Ambulatory Visit (INDEPENDENT_AMBULATORY_CARE_PROVIDER_SITE_OTHER): Payer: BLUE CROSS/BLUE SHIELD | Admitting: Obstetrics and Gynecology

## 2018-03-05 ENCOUNTER — Encounter: Payer: Self-pay | Admitting: Obstetrics and Gynecology

## 2018-03-05 VITALS — BP 110/64 | HR 72 | Resp 14 | Ht 65.75 in | Wt 168.1 lb

## 2018-03-05 DIAGNOSIS — Z79890 Hormone replacement therapy (postmenopausal): Secondary | ICD-10-CM

## 2018-03-05 DIAGNOSIS — E039 Hypothyroidism, unspecified: Secondary | ICD-10-CM

## 2018-03-05 DIAGNOSIS — E28319 Asymptomatic premature menopause: Secondary | ICD-10-CM | POA: Diagnosis not present

## 2018-03-05 DIAGNOSIS — Z5181 Encounter for therapeutic drug level monitoring: Secondary | ICD-10-CM

## 2018-03-05 DIAGNOSIS — R6882 Decreased libido: Secondary | ICD-10-CM

## 2018-03-05 MED ORDER — ESTRADIOL 0.075 MG/24HR TD PTTW: 1.0000 | MEDICATED_PATCH | TRANSDERMAL | 5 refills | Status: DC

## 2018-03-05 NOTE — Progress Notes (Signed)
GYNECOLOGY  VISIT   HPI: 47 y.o.   Married  Caucasian  female   G80P4 with Patient's last menstrual period was 12/16/2013.   here for   Consult for hormones.   Menses stopped after she had toxic shock in 2015 following foot surgery. She was 47 yo at that time.  Had elevated FSH.  She has been treated with HRT since then.   Not sleeping.  Still having hot flashes.  Mood affected.   Concerns are 25 pound weight gain.  Had slightly low thyroid and started thyroid replacement.  Using compounded testosterone.  Not measuring her testosterone levels.  Reporting decreased libido.   Having vaginal dryness.   Had congestive dilated cardiomyopathy, due to septicemia, and has resolved.  GYNECOLOGIC HISTORY: Patient's last menstrual period was 12/16/2013. Contraception:  Post menopausal  Menopausal hormone therapy:  vivelle-dot, prometrium, estradiol vaginal cream Last mammogram:  08/2017 at [facility]- normal per patient  Last pap smear:   08/2017 at [facility]- normal per patient        OB History    Gravida  4   Para      Term      Preterm      AB      Living  4     SAB      TAB      Ectopic      Multiple      Live Births                 Patient Active Problem List   Diagnosis Date Noted  . Recurrent infections 11/03/2013  . Lung mass 07/31/2013  . Toxic shock (HCC) 07/17/2013  . Group A streptococcal infection   . Pericardial effusion   . SIRS (systemic inflammatory response syndrome) (HCC) 07/01/2013  . Right foot infection 07/01/2013  . Hypotension 07/01/2013  . Tachycardia 07/01/2013  . Septic shock (HCC) 07/01/2013  . Tinea versicolor 11/07/2010  . Constipation 11/07/2010  . Radiculopathy of cervical spine 11/07/2010  . OTHER MALAISE AND FATIGUE 09/01/2010  . MITRAL VALVE PROLAPSE 10/07/2009  . MIGRAINE WITH AURA 02/20/2008  . GERD 02/20/2008    Past Medical History:  Diagnosis Date  . GERD (gastroesophageal reflux disease)   . Group A streptococcal  infection   . Hormone disorder   . Pericardial effusion   . Secondary cardiomyopathy (HCC)   . Toxic shock (HCC)   . Toxic shock [facility])     Past Surgical History:  Procedure Laterality Date  . ANTERIOR CERVICAL DECOMP/DISCECTOMY FUSION  11/18/2009   C5-7  . CARPAL TUNNEL RELEASE Left   . CESAREAN SECTION  2008   complete placenta pervia, hemmorage  . CHEILECTOMY Left 05/01/2013   Procedure: LEFT GREAT TOE HALLUX RIGIDUS WITH CHEILECTOMY 1ST METATARSOPHALANGEAL;  Surgeon: Loreta Ave, MD;  Location: [facility];  Service: Orthopedics;  Laterality: Left;  . CHEILECTOMY Right 06/02/2013   procedule:right great toe chielectomy  . HYSTEROSCOPY  10/2017   done by Dr. Pennie Rushing   . I&D EXTREMITY Right 07/02/2013   Procedure: IRRIGATION AND DEBRIDEMENT RIGHT 1ST Metaphalangeal JOINT;  Surgeon: Loreta Ave, MD;  Location: [facility] OR;  Service: Orthopedics;  Laterality: Right;  . KNEE SURGERY  [phone]   7 surgeries left knee(2 lateral releases, 1 tibial tubercle oxtomy, 1 patellectomy)    Current Outpatient Medications  Medication Sig Dispense Refill  . estradiol (ESTRACE) 0.1 MG/GM vaginal cream INSERT 1/2 GRAM VAGINALLY TWICE A WEEK  12  . estradiol (  VIVELLE-DOT) 0.05 MG/24HR patch estradiol 0.05 mg/24 hr semiweekly transdermal patch  APPLY NEW PATCH EVERY 3 DAYS    . NATURE-THROID 32.5 MG tablet TAKE 1 TABLET DAILY ON EMPTY STOMACH  2  . progesterone (PROMETRIUM) 100 MG capsule Take 100 mg by mouth at bedtime.  3  . testosterone (ANDROGEL) 50 MG/5GM (1%) GEL Place 5 g onto the skin 2 (two) times daily.     No current facility-administered medications for this visit.      ALLERGIES: Dilaudid [hydromorphone hcl] and Codeine  Family History  Problem Relation Age of Onset  . Hypertension Father   . Alzheimer's disease Father   . Hypertension Mother   . Hyperlipidemia Mother   . Alzheimer's disease Mother   . Migraines Sister   . Kidney disease Maternal Grandmother    . Heart attack Maternal Grandfather 5    Social History   Socioeconomic History  . Marital status: Married    Spouse name: Not on file  . Number of children: 4  . Years of education: Not on file  . Highest education level: Not on file  Occupational History  . Occupation: stay at home mom    Employer: UNEMPLOYED  Social Needs  . Financial resource strain: Not on file  . Food insecurity:    Worry: Not on file    Inability: Not on file  . Transportation needs:    Medical: Not on file    Non-medical: Not on file  Tobacco Use  . Smoking status: Former Smoker    Last attempt to quit: 11/03/1994    Years since quitting: 23.3  . Smokeless tobacco: Never Used  Substance and Sexual Activity  . Alcohol use: Yes    Comment: occasionally   . Drug use: No  . Sexual activity: Yes    Birth control/protection: Post-menopausal  Lifestyle  . Physical activity:    Days per week: Not on file    Minutes per session: Not on file  . Stress: Not on file  Relationships  . Social connections:    Talks on phone: Not on file    Gets together: Not on file    Attends religious service: Not on file    Active member of club or organization: Not on file    Attends meetings of clubs or organizations: Not on file    Relationship status: Not on file  . Intimate partner violence:    Fear of current or ex partner: Not on file    Emotionally abused: Not on file    Physically abused: Not on file    Forced sexual activity: Not on file  Other Topics Concern  . Not on file  Social History Narrative   Exercise 2 times weekly      Diet : Fruit, veggies, water    Review of Systems  Constitutional: Positive for unexpected weight change.  HENT: Negative.   Eyes: Negative.   Respiratory: Negative.   Cardiovascular: Negative.   Gastrointestinal: Negative.   Endocrine: Positive for heat intolerance.  Genitourinary:       Pain during intercourse  Musculoskeletal: Negative.   Skin: Negative.    Neurological: Negative.   Hematological: Negative.   Psychiatric/Behavioral: Negative.     PHYSICAL EXAMINATION:    BP 110/64 (BP Location: Right Arm, Patient Position: Sitting, Cuff Size: Normal)   Pulse 72   Resp 14   Ht 5' 5.75" (1.67 m)   Wt 168 lb 1.6 oz (76.2 kg)   LMP 12/16/2013  BMI 27.34 kg/m     General appearance: alert, cooperative and appears stated age Head: Normocephalic, without obvious abnormality, atraumatic Neck: no adenopathy, supple, symmetrical, trachea midline and thyroid normal to inspection and palpation Lungs: clear to auscultation bilaterally Heart: regular rate and rhythm Abdomen: soft, non-tender, no masses,  no organomegaly Extremities: extremities normal, atraumatic, no cyanosis or edema Skin: Skin color, texture, turgor normal. No rashes or lesions Lymph nodes: Cervical, supraclavicular, and axillary nodes normal.   Neurologic: Grossly normal  Pelvic:  Deferred.  ASSESSMENT  Premature menopause.  HRT with testosterone replacement.  Migraine with aura.  Does not currently have migraines.  Hypothyroidism.  On Nature thyroid.   PLAN  We had a comprehensive discussion regarding menopause - premature and not premature.  Sexual, cardiovascular, and skeletal changes explained. HRT reviewed from the [facility] perspective and benefits and risks of HRT explained.  Risks may include stroke, DVT, PE, and MI. At her young age, her benefit outweighs the risks. We will increase her Vivelle Dot to 0.075 mg twice weekly.  She will take Prometrium 100 mg daily.   Increase vaginal Estrace 1/2 gram to three times weekly.  Will check her TSH, free T4, testosterone level. She will return for a recheck in 6 weeks.  I will do a pelvic exam then. Comprehensive brochure on menopause to patient for additional reading.    An After Visit Summary was printed and given to the patient.  __30____ minutes face to face time of which over 50% was spent in counseling.

## 2018-03-05 NOTE — Progress Notes (Signed)
Opened in error

## 2018-03-07 LAB — TESTOSTERONE, FREE, DIRECT
TESTOSTERONE, TOTAL: 30.4 ng/dL
Testosterone, Free: 2 pg/mL (ref 0.0–4.2)

## 2018-03-07 LAB — T4, FREE: Free T4: 1.18 ng/dL (ref 0.82–1.77)

## 2018-03-07 LAB — TSH: TSH: 1.57 u[IU]/mL (ref 0.450–4.500)

## 2018-03-11 ENCOUNTER — Telehealth: Payer: Self-pay | Admitting: Emergency Medicine

## 2018-03-11 NOTE — Telephone Encounter (Deleted)
-----   Message from Patton SallesBrook E Amundson C Silva, MD sent at 03/07/2018  7:53 PM EDT ----- Please report normal testosterone levels to patient.  She could probably increase her testosterone use slightly and still stay in a normal female range.  She states she is using compounded testosterone, but we do not have that recorded in her chart in terms of the concentration and how she is using it.  Please get this information from her and add it to the chart and then delete the Androgel, which she states she was not using.  I can then advise her further on how to increase her current usage of the testosterone.  I cannot give a recommendation yet about continuing or not the Nature's thyroid without seeing her testing that lead to her initial prescription of this.   She is returning for a recheck appointment on 04/17/18. She may want to bring a copy of her original thyroid testing at that time.

## 2018-03-11 NOTE — Telephone Encounter (Signed)
Return call to patient.  She is given results and verbalized understanding.   She [facility] and needs a refill of her testosterone. Per [facility] is written as follows:   Testosterone 2% cream in petrolatum base. Apply 1-2 clicks (1/4-1/2 gram) behind knee or inner thigh daily. Dispense 15 g     Routing to Dr. Edward JollySilva to review and advise regarding dose if needs to be increased on prescription.   [facility].

## 2018-03-11 NOTE — Telephone Encounter (Signed)
I would recommend she use the compounded testosterone 1/2 gram consistently daily.  Ok for 30 grams, RF none.   I do need a copy of her last mammogram at [facility].   Please have her keep her follow up appointment so we can reassess her hormone therapy regimen and recheck her testosterone level.

## 2018-03-11 NOTE — Telephone Encounter (Signed)
-----   Message from Brook E Amundson C Silva, MD sent at 03/07/2018  7:53 PM EDT ----- Please report normal testosterone levels to patient.  She could probably increase her testosterone use slightly and still stay in a normal female range.  She states she is using compounded testosterone, but we do not have that recorded in her chart in terms of the concentration and how she is using it.  Please get this information from her and add it to the chart and then delete the Androgel, which she states she was not using.  I can then advise her further on how to increase her current usage of the testosterone.  I cannot give a recommendation yet about continuing or not the Nature's thyroid without seeing her testing that lead to her initial prescription of this.   She is returning for a recheck appointment on 04/17/18. She may want to bring a copy of her original thyroid testing at that time.  

## 2018-03-12 MED ORDER — NONFORMULARY OR COMPOUNDED ITEM: 0 refills | Status: DC

## 2018-03-12 NOTE — Telephone Encounter (Addendum)
[facility].  [facility].  Pt aware to keep follow up appointment. She will bring labs to discuss as well.

## 2018-03-27 ENCOUNTER — Other Ambulatory Visit: Payer: Self-pay | Admitting: Obstetrics and Gynecology

## 2018-03-27 NOTE — Telephone Encounter (Signed)
Medication refill request: estradiol  0.075 mg 24 hour patch  Last AEX:  03/05/18 Next AEX: 08/21/18 Last MMG (if hormonal medication request): NA Refill authorized:patient is needing 90 day supply. Please advise

## 2018-04-17 ENCOUNTER — Ambulatory Visit (INDEPENDENT_AMBULATORY_CARE_PROVIDER_SITE_OTHER): Payer: BLUE CROSS/BLUE SHIELD | Admitting: Obstetrics and Gynecology

## 2018-04-17 ENCOUNTER — Encounter: Payer: Self-pay | Admitting: Obstetrics and Gynecology

## 2018-04-17 ENCOUNTER — Other Ambulatory Visit: Payer: Self-pay

## 2018-04-17 ENCOUNTER — Telehealth: Payer: Self-pay | Admitting: Obstetrics and Gynecology

## 2018-04-17 VITALS — BP 110/64 | HR 64 | Resp 14 | Ht 65.5 in | Wt 168.0 lb

## 2018-04-17 DIAGNOSIS — Z79890 Hormone replacement therapy (postmenopausal): Secondary | ICD-10-CM | POA: Diagnosis not present

## 2018-04-17 DIAGNOSIS — N951 Menopausal and female climacteric states: Secondary | ICD-10-CM

## 2018-04-17 DIAGNOSIS — R6882 Decreased libido: Secondary | ICD-10-CM

## 2018-04-17 MED ORDER — ESTRADIOL 0.1 MG/24HR TD PTTW: 1.0000 | MEDICATED_PATCH | TRANSDERMAL | 1 refills | Status: DC

## 2018-04-17 MED ORDER — PROGESTERONE MICRONIZED 100 MG PO CAPS: 100.0000 mg | ORAL_CAPSULE | Freq: Every day | ORAL | 1 refills | Status: DC

## 2018-04-17 NOTE — Progress Notes (Signed)
GYNECOLOGY  VISIT   HPI: 47 y.o.   Married  Caucasian  female   G76P0 with No LMP recorded (lmp unknown). Patient is postmenopausal.   here for   Six week recheck for hormones Patient states that she is still having hot flashes. She states that she is not sleeping well.   On Vivelle Dot 0.075 mg twice weekly.  On Prometrium 100 mg daily.  Using vaginal estrogen 1/2 gm three times weekly.   Vaginal dryness is improving.   States the more hormones she took the more she had mood swings.  States she has irritability due to exhaustion.  Decreased patience.  Took Zoloft in the past, and it increased with this medication.   Free testosterone 2.0 on 03/05/18.  She is on testosterone.  She is now using 2 clicks per day, which is increased from one click per day.  Not noticing any difference in this.   She is worried about her weight gain.  Gained 25 pounds in a year.  Saw a nutritionist.  Is a trainer.   Thyroid testing - TSH is 1.36, free T4 1.3.  01/19/17.  GYNECOLOGIC HISTORY: No LMP recorded (lmp unknown). Patient is postmenopausal. Contraception:  Postmenopausal Menopausal hormone therapy:  Vivelle-dot, prometrium, estradiol vaginal cream Last mammogram:  08/20/17 [facility] 2 benign/density c Last pap smear:  ---        OB History    Gravida  4   Para      Term      Preterm      AB      Living  4     SAB      TAB      Ectopic      Multiple      Live Births                 Patient Active Problem List   Diagnosis Date Noted  . Recurrent infections 11/03/2013  . Lung mass 07/31/2013  . Toxic shock (HCC) 07/17/2013  . Group A streptococcal infection   . Pericardial effusion   . SIRS (systemic inflammatory response syndrome) (HCC) 07/01/2013  . Right foot infection 07/01/2013  . Hypotension 07/01/2013  . Tachycardia 07/01/2013  . Septic shock (HCC) 07/01/2013  . Tinea versicolor 11/07/2010  . Constipation 11/07/2010  . Radiculopathy of cervical spine  11/07/2010  . OTHER MALAISE AND FATIGUE 09/01/2010  . MITRAL VALVE PROLAPSE 10/07/2009  . MIGRAINE WITH AURA 02/20/2008  . GERD 02/20/2008    Past Medical History:  Diagnosis Date  . GERD (gastroesophageal reflux disease)   . Group A streptococcal infection   . Hormone disorder   . Pericardial effusion   . Secondary cardiomyopathy (HCC)   . Toxic shock (HCC)   . Toxic shock [facility])     Past Surgical History:  Procedure Laterality Date  . ANTERIOR CERVICAL DECOMP/DISCECTOMY FUSION  11/18/2009   C5-7  . CARPAL TUNNEL RELEASE Left   . CESAREAN SECTION  2008   complete placenta pervia, hemmorage  . CHEILECTOMY Left 05/01/2013   Procedure: LEFT GREAT TOE HALLUX RIGIDUS WITH CHEILECTOMY 1ST METATARSOPHALANGEAL;  Surgeon: Loreta Ave, MD;  Location: [facility];  Service: Orthopedics;  Laterality: Left;  . CHEILECTOMY Right 06/02/2013   procedule:right great toe chielectomy  . HYSTEROSCOPY  10/2017   done by Dr. Pennie Rushing   . I&D EXTREMITY Right 07/02/2013   Procedure: IRRIGATION AND DEBRIDEMENT RIGHT 1ST Metaphalangeal JOINT;  Surgeon: Loreta Ave, MD;  Location:  [facility] OR;  Service: Orthopedics;  Laterality: Right;  . KNEE SURGERY  [phone]   7 surgeries left knee(2 lateral releases, 1 tibial tubercle oxtomy, 1 patellectomy)    Current Outpatient Medications  Medication Sig Dispense Refill  . estradiol (ESTRACE) 0.1 MG/GM vaginal cream INSERT 1/2 GRAM VAGINALLY TWICE A WEEK  12  . estradiol (VIVELLE-DOT) 0.075 MG/24HR PLACE 1 PATCH ONTO THE SKIN 2 (TWO) TIMES A WEEK. 24 patch 0  . NATURE-THROID 32.5 MG tablet TAKE 1 TABLET DAILY ON EMPTY STOMACH  2  . NONFORMULARY OR COMPOUNDED ITEM Testosterone 2% cream   Apply 2 clicks (0.5 gram) behind knee or inner thigh daily. Dispense 30 grams. 30 each 0  . progesterone (PROMETRIUM) 100 MG capsule Take 100 mg by mouth at bedtime.  3   No current facility-administered medications for this visit.      ALLERGIES: Dilaudid  [hydromorphone hcl] and Codeine  Family History  Problem Relation Age of Onset  . Hypertension Father   . Alzheimer's disease Father   . Hypertension Mother   . Hyperlipidemia Mother   . Alzheimer's disease Mother   . Migraines Sister   . Kidney disease Maternal Grandmother   . Heart attack Maternal Grandfather 37    Social History   Socioeconomic History  . Marital status: Married    Spouse name: Not on file  . Number of children: 4  . Years of education: Not on file  . Highest education level: Not on file  Occupational History  . Occupation: stay at home mom    Employer: UNEMPLOYED  Social Needs  . Financial resource strain: Not on file  . Food insecurity:    Worry: Not on file    Inability: Not on file  . Transportation needs:    Medical: Not on file    Non-medical: Not on file  Tobacco Use  . Smoking status: Former Smoker    Last attempt to quit: 11/03/1994    Years since quitting: 23.4  . Smokeless tobacco: Never Used  Substance and Sexual Activity  . Alcohol use: Yes    Comment: occasionally   . Drug use: No  . Sexual activity: Yes    Birth control/protection: Post-menopausal  Lifestyle  . Physical activity:    Days per week: Not on file    Minutes per session: Not on file  . Stress: Not on file  Relationships  . Social connections:    Talks on phone: Not on file    Gets together: Not on file    Attends religious service: Not on file    Active member of club or organization: Not on file    Attends meetings of clubs or organizations: Not on file    Relationship status: Not on file  . Intimate partner violence:    Fear of current or ex partner: Not on file    Emotionally abused: Not on file    Physically abused: Not on file    Forced sexual activity: Not on file  Other Topics Concern  . Not on file  Social History Narrative   Exercise 2 times weekly      Diet : Fruit, veggies, water    Review of Systems  All other systems reviewed and are  negative.   PHYSICAL EXAMINATION:    BP 110/64   Pulse 64   Resp 14   Ht 5' 5.5" (1.664 m)   Wt 168 lb (76.2 kg)   LMP  (LMP Unknown)   BMI 27.53  kg/m     General appearance: alert, cooperative and appears stated age Head: Normocephalic, without obvious abnormality, atraumatic Neck: no adenopathy, supple, symmetrical, trachea midline and thyroid normal to inspection and palpation Lungs: clear to auscultation bilaterally  Heart: regular rate and rhythm Abdomen: soft, non-tender, no masses,  no organomegaly Extremities: extremities normal, atraumatic, no cyanosis or edema Skin: Skin color, texture, turgor normal. No rashes or lesions Lymph nodes: Cervical, supraclavicular, and axillary nodes normal. No abnormal inguinal nodes palpated Neurologic: Grossly normal  Pelvic: External genitalia:  no lesions              Urethra:  normal appearing urethra with no masses, tenderness or lesions              Bartholins and Skenes: normal                 Vagina: normal appearing vagina with normal color and discharge, no lesions              Cervix: no lesions                Bimanual Exam:  Uterus:  normal size, contour, position, consistency, mobility, non-tender              Adnexa: no mass, fullness, tenderness             Chaperone was present for exam.  ASSESSMENT  On thyroid support.  Normal TFTs prior to this.  Menopausal symptoms.  HRT.  On testosterone therapy.   PLAN  She will stop her thyroid support medication.  Will retest testosterone levels. Increase Vivelle Dot to 0.1 mg twice weekly.  90 day and one refill.  Continue Prometrium 100 mg nightly.  90 day and one refill.  If symptoms of poor sleep continue, will increase her Prometrium to 200 mg daily.  Will not do SSRI or SNRI at this time.  I discussed potential side effects of this.  We discussed benefits and potential risks of stroke, DVT, PE, MI and breast cancer.  AEX/mammogram in Jan. 2020.  Will need to  retest TFTs then.  Questions invited and answered.   An After Visit Summary was printed and given to the patient.  ___25___ minutes face to face time of which over 50% was spent in counseling.

## 2018-04-17 NOTE — Telephone Encounter (Signed)
Hi Dr. Edward Jolly,    I just wanted to reach out about our visit and ask a quick question. I know we decided that the Zoloft would not be a good choice since it can cause weight gain. As for the Wellbutrin, have we decided that it is not a good option at this time? While I think some of my irritability is due to fatigue, is it possible that some of it is caused by something else? I definitely feel more irritable and impatient than i have, as I have been fatigued for awhile. Just thinking and wondering what your thoughts are. I realized I left the office and wasn't clear on how we would be able to address moods. Thank you so much!

## 2018-04-17 NOTE — Telephone Encounter (Signed)
Routing mychart message to Dr. Edward Jolly to review.  Pt with questions regarding starting Wellbutrin.

## 2018-04-18 NOTE — Telephone Encounter (Signed)
Ok to start Wellbutrin XL 150 mg daily. #30, RF one.  She may be more wakeful at night when she begins this medication.  I recommend she take it in the am.  Please have her see me in about 6 weeks to see how she is doing on this medication.  She has an annual for January, but that seems a long time away to assess her response to the medication.

## 2018-04-18 NOTE — Telephone Encounter (Signed)
Message left to return call to Carrianne Hyun at [phone].    

## 2018-04-19 LAB — TESTOSTERONE, FREE, DIRECT
Testosterone, Free: 4 pg/mL (ref 0.0–4.2)
Testosterone, total: 131.5 ng/dL

## 2018-04-21 ENCOUNTER — Encounter: Payer: Self-pay | Admitting: Obstetrics and Gynecology

## 2018-04-21 MED ORDER — BUPROPION HCL ER (XL) 150 MG PO TB24: 150.0000 mg | ORAL_TABLET | Freq: Every day | ORAL | 2 refills | Status: DC

## 2018-04-21 NOTE — Addendum Note (Signed)
Addended by: Ardell Isaacs, BROOK E on: 04/21/2018 10:45 AM   Modules accepted: Orders

## 2018-04-22 NOTE — Telephone Encounter (Signed)
Patient sent the following correspondence through MyChart in a reply to a MyChart message from the provider. Routing to triage to provider for FYI only.  Thank you for letting me know! Maybe this does explain the moods!  Thank you for calling in the Wellbutrin as well. I truly appreciate your care and concern! :)   Cc: Triage for FYI.

## 2018-04-30 ENCOUNTER — Encounter: Payer: Self-pay | Admitting: Obstetrics and Gynecology

## 2018-05-01 ENCOUNTER — Telehealth: Payer: Self-pay | Admitting: Obstetrics and Gynecology

## 2018-05-01 MED ORDER — ESTRADIOL 0.1 MG/24HR TD PTTW: 1.0000 | MEDICATED_PATCH | TRANSDERMAL | 0 refills | Status: DC

## 2018-05-01 NOTE — Telephone Encounter (Signed)
Spoke with patient. Patient is using estradiol 0.1 mg patch, is having difficulty with patch staying on after showering.   Patient has tried applying to abdomen and buttocks, not in folds or where clothing rubs against patch. No oils or lotions to skin before applying.   Compared brand to generic and they are the same in size. Patient states she has used minivelle in the past and did not have this issue, asking for alternative.   Advised I will review with Dr. Edward JollySilva and return call with recommendations.   Dr. Edward JollySilva -please review and advise.

## 2018-05-01 NOTE — Telephone Encounter (Signed)
Patient sent the following correspondence through MyChart. Routing to triage to assist patient with request.  Hi Dr. Edward JollySilva,    I'm writing because I am having a hard time getting the generic estradiol patch to stay on my skin after showering. Is there another brand or type I could try? Thanks so much!   Shelly [name]

## 2018-05-01 NOTE — Telephone Encounter (Signed)
Left message to call Ellyse Rotolo at [phone].  

## 2018-05-01 NOTE — Telephone Encounter (Signed)
Left message to call Shelly [name] at [phone].  

## 2018-05-01 NOTE — Telephone Encounter (Signed)
Spoke with patient. Advised as seen below per Dr. Edward JollySilva. New RX for Minivelle 0.1mg  patch to verified pharmacy. Patient states she has already picked up a 3 mo supply of estradiol patches, will check with her pharmacy for coverage of the new patch. Aware to return call to office if any additional questions.   Encounter closed.

## 2018-05-01 NOTE — Telephone Encounter (Signed)
I would recommend the Minivelle!

## 2018-05-08 DIAGNOSIS — G5622 Lesion of ulnar nerve, left upper limb: Secondary | ICD-10-CM | POA: Diagnosis not present

## 2018-05-08 DIAGNOSIS — M4722 Other spondylosis with radiculopathy, cervical region: Secondary | ICD-10-CM | POA: Diagnosis not present

## 2018-05-09 ENCOUNTER — Encounter: Payer: Self-pay | Admitting: Obstetrics and Gynecology

## 2018-05-09 ENCOUNTER — Telehealth: Payer: Self-pay | Admitting: Obstetrics and Gynecology

## 2018-05-09 NOTE — Telephone Encounter (Signed)
Call to patient and left message to return my call.  If patient returns call, she can schedule office visit at her convenience for bacterial vaginosis symptoms.   Also, sent message through mychart:   To: Shelly [name] "Shelly [name]"  From: Joeseph Amor, RN  Created: 05/09/2018 2:58 PM Shelly [name],   My name is French Ana and I am a triage nurse with Dr. Edward Jolly. I am very sorry you are having symptoms of bacterial vaginosis. I just tried to call you and left you a voice mail. I reviewed your chart and I did not see any recent treatment for you from Dr. Edward Jolly. Dr. Edward Jolly will need to see you for an office visit for an examination if you feel you need a prescription. Please call our office and schedule an appointment to see Dr. Edward Jolly at your convenience. We look forward to speaking with you very soon!  Sincerely,  Almedia Balls, RN

## 2018-05-09 NOTE — Telephone Encounter (Signed)
Patient sent the following message through MyChart. Routing to triage to assist patient with request.   Hi Dr. Edward Jolly,    I get BV infections every few months since perimenopause. I am currently having those same symptoms now. I use Clindesse whenever I get a recurrence. Would you be able to call something in or is this something I have to make an appointment for, given that I have had these infections many times?    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

## 2018-05-10 ENCOUNTER — Other Ambulatory Visit: Payer: Self-pay | Admitting: Orthopedic Surgery

## 2018-05-10 DIAGNOSIS — M4722 Other spondylosis with radiculopathy, cervical region: Secondary | ICD-10-CM

## 2018-05-10 NOTE — Telephone Encounter (Signed)
Dr. Edward Jolly,  Pt read mychart response.  Did not return call.  Okay to close encounter?  Last read by [name] at 2:58 PM on 05/09/2018.

## 2018-05-11 NOTE — Telephone Encounter (Signed)
Encounter closed

## 2018-05-17 DIAGNOSIS — G540 Brachial plexus disorders: Secondary | ICD-10-CM | POA: Diagnosis not present

## 2018-05-17 DIAGNOSIS — Z885 Allergy status to narcotic agent status: Secondary | ICD-10-CM | POA: Diagnosis not present

## 2018-05-17 DIAGNOSIS — R200 Anesthesia of skin: Secondary | ICD-10-CM | POA: Diagnosis not present

## 2018-05-23 ENCOUNTER — Other Ambulatory Visit: Payer: Self-pay | Admitting: Obstetrics and Gynecology

## 2018-05-23 NOTE — Progress Notes (Signed)
GYNECOLOGY  VISIT   HPI: 47 y.o.   Married  Caucasian  female   G64P0 with Patient's last menstrual period was 12/16/2013 (exact date).   here for abnormal vaginal discharge. Patient denies vaginal odor but does have vaginal dryness. She also has redness/irritation towards rectal area. Urinary frequency.    Feels like she has constant moisture.  Feels raw inside and around the rectum.  Not itching. No odor.  No OTC meds.   Hx BV in the past.  Used Clindesse.  Metrogel did not work well for her.   Not using the vaginal estrogen.   She is almost out of testosterone.  She is now using once daily.  Testosterone level 131.5 on 04/17/18.  Free testosterone 4.0. She will have a level of testosterone checked in Jan. 2020.  GYNECOLOGIC HISTORY: Patient's last menstrual period was 12/16/2013 (exact date). Contraception:  Postmenopausal Menopausal hormone therapy:  Vivelle-dot, prometrium, estradiol vaginal cream Last mammogram:  08/20/17 BIRADS 2 benign/density c Last pap smear: 08/2017 normal per patient        OB History    Gravida  4   Para      Term      Preterm      AB      Living  4     SAB      TAB      Ectopic      Multiple      Live Births                 Patient Active Problem List   Diagnosis Date Noted  . Recurrent infections 11/03/2013  . Lung mass 07/31/2013  . Toxic shock (HCC) 07/17/2013  . Group A streptococcal infection   . Pericardial effusion   . SIRS (systemic inflammatory response syndrome) (HCC) 07/01/2013  . Right foot infection 07/01/2013  . Hypotension 07/01/2013  . Tachycardia 07/01/2013  . Septic shock (HCC) 07/01/2013  . Tinea versicolor 11/07/2010  . Constipation 11/07/2010  . Radiculopathy of cervical spine 11/07/2010  . OTHER MALAISE AND FATIGUE 09/01/2010  . MITRAL VALVE PROLAPSE 10/07/2009  . MIGRAINE WITH AURA 02/20/2008  . GERD 02/20/2008    Past Medical History:  Diagnosis Date  . GERD (gastroesophageal reflux  disease)   . Group A streptococcal infection   . Hormone disorder   . Pericardial effusion   . Secondary cardiomyopathy (HCC)   . Toxic shock (HCC)   . Toxic shock [facility])     Past Surgical History:  Procedure Laterality Date  . ANTERIOR CERVICAL DECOMP/DISCECTOMY FUSION  11/18/2009   C5-7  . CARPAL TUNNEL RELEASE Left   . CESAREAN SECTION  2008   complete placenta pervia, hemmorage  . CHEILECTOMY Left 05/01/2013   Procedure: LEFT GREAT TOE HALLUX RIGIDUS WITH CHEILECTOMY 1ST METATARSOPHALANGEAL;  Surgeon: Loreta Ave, MD;  Location: [facility];  Service: Orthopedics;  Laterality: Left;  . CHEILECTOMY Right 06/02/2013   procedule:right great toe chielectomy  . HYSTEROSCOPY  10/2017   done by Dr. Pennie Rushing   . I&D EXTREMITY Right 07/02/2013   Procedure: IRRIGATION AND DEBRIDEMENT RIGHT 1ST Metaphalangeal JOINT;  Surgeon: Loreta Ave, MD;  Location: [facility] OR;  Service: Orthopedics;  Laterality: Right;  . KNEE SURGERY  [phone]   7 surgeries left knee(2 lateral releases, 1 tibial tubercle oxtomy, 1 patellectomy)    Current Outpatient Medications  Medication Sig Dispense Refill  . estradiol (MINIVELLE) 0.1 MG/24HR patch Place 1 patch (0.1 mg total) onto the  skin 2 (two) times a week. 24 patch 0  . NONFORMULARY OR COMPOUNDED ITEM Testosterone 2% cream   Apply 2 clicks (0.5 gram) behind knee or inner thigh daily. Dispense 30 grams. 30 each 0  . progesterone (PROMETRIUM) 100 MG capsule Take 1 capsule (100 mg total) by mouth at bedtime. 90 capsule 1  . buPROPion (WELLBUTRIN XL) 150 MG 24 hr tablet TAKE 1 TABLET BY MOUTH EVERY DAY (Patient not taking: Reported on 05/24/2018) 90 tablet 0  . estradiol (ESTRACE) 0.1 MG/GM vaginal cream INSERT 1/2 GRAM VAGINALLY TWICE A WEEK  12   No current facility-administered medications for this visit.      ALLERGIES: Dilaudid [hydromorphone hcl] and Codeine  Family History  Problem Relation Age of Onset  . Hypertension Father   .  Alzheimer's disease Father   . Hypertension Mother   . Hyperlipidemia Mother   . Alzheimer's disease Mother   . Migraines Sister   . Kidney disease Maternal Grandmother   . Heart attack Maternal Grandfather 35    Social History   Socioeconomic History  . Marital status: Married    Spouse name: Not on file  . Number of children: 4  . Years of education: Not on file  . Highest education level: Not on file  Occupational History  . Occupation: stay at home mom    Employer: UNEMPLOYED  Social Needs  . Financial resource strain: Not on file  . Food insecurity:    Worry: Not on file    Inability: Not on file  . Transportation needs:    Medical: Not on file    Non-medical: Not on file  Tobacco Use  . Smoking status: Former Smoker    Last attempt to quit: 11/03/1994    Years since quitting: 23.5  . Smokeless tobacco: Never Used  Substance and Sexual Activity  . Alcohol use: Yes    Comment: occasionally   . Drug use: No  . Sexual activity: Yes    Birth control/protection: Post-menopausal  Lifestyle  . Physical activity:    Days per week: Not on file    Minutes per session: Not on file  . Stress: Not on file  Relationships  . Social connections:    Talks on phone: Not on file    Gets together: Not on file    Attends religious service: Not on file    Active member of club or organization: Not on file    Attends meetings of clubs or organizations: Not on file    Relationship status: Not on file  . Intimate partner violence:    Fear of current or ex partner: Not on file    Emotionally abused: Not on file    Physically abused: Not on file    Forced sexual activity: Not on file  Other Topics Concern  . Not on file  Social History Narrative   Exercise 2 times weekly      Diet : Fruit, veggies, water    Review of Systems  All other systems reviewed and are negative.   PHYSICAL EXAMINATION:    BP 120/78 (BP Location: Right Arm, Patient Position: Sitting, Cuff Size:  Normal)   Pulse 60   Ht 5' 5.5" (1.664 m)   Wt 167 lb 6.4 oz (75.9 kg)   LMP 12/16/2013 (Exact Date)   BMI 27.43 kg/m     General appearance: alert, cooperative and appears stated age    Pelvic: External genitalia:  no lesions  Urethra:  normal appearing urethra with no masses, tenderness or lesions              Bartholins and Skenes: normal                 Vagina: normal appearing vagina with normal.  Large amount of slight yellow creamy discharge.              Cervix: no lesions                Bimanual Exam:  Uterus:  normal size, contour, position, consistency, mobility, non-tender              Adnexa: no mass, fullness, tenderness             Chaperone was present for exam.  ASSESSMENT  Vulvovaginitis.  HRT and testosterone treatment.  Elevated testosterone level.   PLAN  We discussed forms of vaginitis.  We also reviewed the effect of hormones on the cervix and vaginal environment.  After any vaginitis is treated, she will then start using the vaginal estrogen prescription.  Questions invited and answered. Refill of testosterone.  She is now using one click per day due to elevated level.  Follow up in January, 2020.  Will do testosterone level check then. OTC hydrocortisone cream to perirectal area or vulva prn.  Avoid local irritants.  Try to keep the area dry.   An After Visit Summary was printed and given to the patient.  __15___ minutes face to face time of which over 50% was spent in counseling.

## 2018-05-23 NOTE — Telephone Encounter (Signed)
Medication refill request: wellbutrin xl 150  Last OV: 04/17/18    Next AEX: 08/21/18 Last MMG (if hormonal medication request): 08/20/17 Bi-rads 2 benign  Refill authorized: #[id number] with 0  Pharmacy requesting 90 day supply

## 2018-05-24 ENCOUNTER — Encounter: Payer: Self-pay | Admitting: Obstetrics and Gynecology

## 2018-05-24 ENCOUNTER — Ambulatory Visit: Payer: BLUE CROSS/BLUE SHIELD | Admitting: Obstetrics and Gynecology

## 2018-05-24 ENCOUNTER — Other Ambulatory Visit: Payer: Self-pay

## 2018-05-24 VITALS — BP 120/78 | HR 60 | Ht 65.5 in | Wt 167.4 lb

## 2018-05-24 DIAGNOSIS — N760 Acute vaginitis: Secondary | ICD-10-CM | POA: Diagnosis not present

## 2018-05-24 MED ORDER — NONFORMULARY OR COMPOUNDED ITEM: 0 refills | Status: DC

## 2018-05-24 NOTE — Patient Instructions (Signed)

## 2018-05-25 LAB — VAGINITIS/VAGINOSIS, DNA PROBE
CANDIDA SPECIES: NEGATIVE
GARDNERELLA VAGINALIS: NEGATIVE
Trichomonas vaginosis: NEGATIVE

## 2018-06-03 ENCOUNTER — Encounter: Payer: Self-pay | Admitting: Obstetrics and Gynecology

## 2018-06-03 ENCOUNTER — Telehealth: Payer: Self-pay | Admitting: Obstetrics and Gynecology

## 2018-06-03 NOTE — Telephone Encounter (Signed)
Will route mychart message to Dr. Edward Jolly to review.  Pt in for office visit and vaginitis discussed.  Pt used monistat 3 day treatment.  Vaginal estrogen (Estrace) caused burning feeling.

## 2018-06-03 NOTE — Telephone Encounter (Signed)
It sounds like she would benefit from returning to the office for a recheck.  Please have her stop using all vaginal products and see me for an appointment.

## 2018-06-03 NOTE — Telephone Encounter (Signed)
Spoke with patient and she is agreeable to office visit for skin recheck and aware to avoid any vaginal products in vagina until she is seen.  Will call back with any questions prior to appointment 06/05/2018.  Encounter closed.

## 2018-06-03 NOTE — Telephone Encounter (Signed)
Patient sent the following correspondence through MyChart. Routing to triage to assist patient with request.  Hi Dr. Edward Jolly,    I was in last week because I thought I had BV. The tests came back negative, but I was having a lot of burning and discomfort and some itching after using the vaginal estradiol, so I did a Monistat 3 day just to be sure. Well, I am feeling a lot more discomfort now, feeling swollen, raw and burning. Could this really all be strophic vaginitis and I am just making things worse? If so, why would the estradiol cause discomfort? Sorry for all of the questions, this is just new to me!

## 2018-06-03 NOTE — Progress Notes (Signed)
GYNECOLOGY  VISIT   HPI: 47 y.o.   Married  Caucasian  female   G1P0 with Patient's last menstrual period was 12/16/2013 (exact date).   here for vaginal discharge and itching/irritation. Patient tried OTC Monistat and feels worse.  Last day of Monistat was Sunday.   Felt like she developed irritation following use of the estradiol cream.  Then tried Monistat and after 2 days, she felt uncomfortable.  Last dosage was 3 days ago.   Now vulvar itching and burning.  No internal itching.   No change in personal products.   Not using vaginal estrogen currently.   Is a Systems analyst.  GYNECOLOGIC HISTORY: Patient's last menstrual period was 12/16/2013 (exact date). Contraception:  Postmenopausal Menopausal hormone therapy: Vivelle-dot, prometrium, estradiol vaginal cream Last mammogram: 08/20/17 BIRADS 2 benign/density c Last pap smear: 08/2017 normal per patient        OB History    Gravida  4   Para      Term      Preterm      AB      Living  4     SAB      TAB      Ectopic      Multiple      Live Births                 Patient Active Problem List   Diagnosis Date Noted  . Recurrent infections 11/03/2013  . Lung mass 07/31/2013  . Toxic shock (HCC) 07/17/2013  . Group A streptococcal infection   . Pericardial effusion   . SIRS (systemic inflammatory response syndrome) (HCC) 07/01/2013  . Right foot infection 07/01/2013  . Hypotension 07/01/2013  . Tachycardia 07/01/2013  . Septic shock (HCC) 07/01/2013  . Tinea versicolor 11/07/2010  . Constipation 11/07/2010  . Radiculopathy of cervical spine 11/07/2010  . OTHER MALAISE AND FATIGUE 09/01/2010  . MITRAL VALVE PROLAPSE 10/07/2009  . MIGRAINE WITH AURA 02/20/2008  . GERD 02/20/2008    Past Medical History:  Diagnosis Date  . GERD (gastroesophageal reflux disease)   . Group A streptococcal infection   . Hormone disorder   . Pericardial effusion   . Secondary cardiomyopathy (HCC)   . Toxic  shock (HCC)   . Toxic shock [facility])     Past Surgical History:  Procedure Laterality Date  . ANTERIOR CERVICAL DECOMP/DISCECTOMY FUSION  11/18/2009   C5-7  . CARPAL TUNNEL RELEASE Left   . CESAREAN SECTION  2008   complete placenta pervia, hemmorage  . CHEILECTOMY Left 05/01/2013   Procedure: LEFT GREAT TOE HALLUX RIGIDUS WITH CHEILECTOMY 1ST METATARSOPHALANGEAL;  Surgeon: Loreta Ave, MD;  Location: [facility];  Service: Orthopedics;  Laterality: Left;  . CHEILECTOMY Right 06/02/2013   procedule:right great toe chielectomy  . HYSTEROSCOPY  10/2017   done by Dr. Pennie Rushing   . I&D EXTREMITY Right 07/02/2013   Procedure: IRRIGATION AND DEBRIDEMENT RIGHT 1ST Metaphalangeal JOINT;  Surgeon: Loreta Ave, MD;  Location: [facility] OR;  Service: Orthopedics;  Laterality: Right;  . KNEE SURGERY  [phone]   7 surgeries left knee(2 lateral releases, 1 tibial tubercle oxtomy, 1 patellectomy)    Current Outpatient Medications  Medication Sig Dispense Refill  . buPROPion (WELLBUTRIN XL) 150 MG 24 hr tablet TAKE 1 TABLET BY MOUTH EVERY DAY 90 tablet 0  . estradiol (ESTRACE) 0.1 MG/GM vaginal cream INSERT 1/2 GRAM VAGINALLY TWICE A WEEK  12  . estradiol (MINIVELLE) 0.1 MG/24HR patch Place  1 patch (0.1 mg total) onto the skin 2 (two) times a week. 24 patch 0  . NONFORMULARY OR COMPOUNDED ITEM Testosterone 2% cream   Apply 1 click (0.5 gram) behind knee or inner thigh daily. Dispense 30 grams. 30 each 0  . progesterone (PROMETRIUM) 100 MG capsule Take 1 capsule (100 mg total) by mouth at bedtime. 90 capsule 1   No current facility-administered medications for this visit.      ALLERGIES: Dilaudid [hydromorphone hcl] and Codeine  Family History  Problem Relation Age of Onset  . Hypertension Father   . Alzheimer's disease Father   . Hypertension Mother   . Hyperlipidemia Mother   . Alzheimer's disease Mother   . Migraines Sister   . Kidney disease Maternal Grandmother   . Heart  attack Maternal Grandfather 23    Social History   Socioeconomic History  . Marital status: Married    Spouse name: Not on file  . Number of children: 4  . Years of education: Not on file  . Highest education level: Not on file  Occupational History  . Occupation: stay at home mom    Employer: UNEMPLOYED  Social Needs  . Financial resource strain: Not on file  . Food insecurity:    Worry: Not on file    Inability: Not on file  . Transportation needs:    Medical: Not on file    Non-medical: Not on file  Tobacco Use  . Smoking status: Former Smoker    Last attempt to quit: 11/03/1994    Years since quitting: 23.6  . Smokeless tobacco: Never Used  Substance and Sexual Activity  . Alcohol use: Yes    Comment: occasionally   . Drug use: No  . Sexual activity: Yes    Birth control/protection: Post-menopausal  Lifestyle  . Physical activity:    Days per week: Not on file    Minutes per session: Not on file  . Stress: Not on file  Relationships  . Social connections:    Talks on phone: Not on file    Gets together: Not on file    Attends religious service: Not on file    Active member of club or organization: Not on file    Attends meetings of clubs or organizations: Not on file    Relationship status: Not on file  . Intimate partner violence:    Fear of current or ex partner: Not on file    Emotionally abused: Not on file    Physically abused: Not on file    Forced sexual activity: Not on file  Other Topics Concern  . Not on file  Social History Narrative   Exercise 2 times weekly      Diet : Fruit, veggies, water    Review of Systems  All other systems reviewed and are negative.   PHYSICAL EXAMINATION:    BP 116/76 (BP Location: Right Arm, Patient Position: Sitting, Cuff Size: Normal)   Pulse 70   Ht 5' 5.5" (1.664 m)   Wt 168 lb (76.2 kg)   LMP 12/16/2013 (Exact Date)   BMI 27.53 kg/m     General appearance: alert, cooperative and appears stated  age  Pelvic: External genitalia:  Vulva with erythema.  No lesions.               Urethra:  normal appearing urethra with no masses, tenderness or lesions              Bartholins and Skenes:  normal                 Vagina: normal appearing vagina with normal color and clumpy white discharge, no lesions              Cervix: no lesions                Bimanual Exam:  Uterus:  normal size, contour, position, consistency, mobility, non-tender              Adnexa: no mass, fullness, tenderness        Chaperone was present for exam.  Wet prep - pH 4.5, positive hyphae, no clue cells.  ASSESSMENT  Vulvovaginitis.   PLAN  nuSwab sent.  Mycolog II cream.  Diflucan 150 mg po, may repeat x 1 in 72 hours prn.  Try to use cotton, loose fitting clothing.  Avoid vaginal estrogen while treating for yeast.    An After Visit Summary was printed and given to the patient.  __15____ minutes face to face time of which over 50% was spent in counseling.

## 2018-06-05 ENCOUNTER — Encounter: Payer: Self-pay | Admitting: Obstetrics and Gynecology

## 2018-06-05 ENCOUNTER — Other Ambulatory Visit: Payer: Self-pay

## 2018-06-05 ENCOUNTER — Ambulatory Visit: Payer: BLUE CROSS/BLUE SHIELD | Admitting: Obstetrics and Gynecology

## 2018-06-05 VITALS — BP 116/76 | HR 70 | Ht 65.5 in | Wt 168.0 lb

## 2018-06-05 DIAGNOSIS — N760 Acute vaginitis: Secondary | ICD-10-CM

## 2018-06-05 MED ORDER — FLUCONAZOLE 150 MG PO TABS: 150.0000 mg | ORAL_TABLET | Freq: Once | ORAL | 0 refills | Status: AC

## 2018-06-05 MED ORDER — NYSTATIN-TRIAMCINOLONE 100000-0.1 UNIT/GM-% EX CREA: 1.0000 "application " | TOPICAL_CREAM | Freq: Two times a day (BID) | CUTANEOUS | 0 refills | Status: DC

## 2018-06-05 NOTE — Patient Instructions (Signed)

## 2018-06-07 LAB — NUSWAB VAGINITIS (VG)
Candida albicans, NAA: POSITIVE — AB
Candida glabrata, NAA: NEGATIVE
Trich vag by NAA: NEGATIVE

## 2018-06-09 ENCOUNTER — Encounter: Payer: Self-pay | Admitting: Obstetrics and Gynecology

## 2018-06-10 ENCOUNTER — Encounter: Payer: Self-pay | Admitting: Obstetrics and Gynecology

## 2018-06-10 ENCOUNTER — Telehealth: Payer: Self-pay | Admitting: Obstetrics and Gynecology

## 2018-06-10 DIAGNOSIS — M542 Cervicalgia: Secondary | ICD-10-CM | POA: Diagnosis not present

## 2018-06-10 DIAGNOSIS — R202 Paresthesia of skin: Secondary | ICD-10-CM | POA: Diagnosis not present

## 2018-06-10 DIAGNOSIS — R200 Anesthesia of skin: Secondary | ICD-10-CM | POA: Diagnosis not present

## 2018-06-10 NOTE — Telephone Encounter (Signed)
I recommend she repeat the Diflucan 150 mg orally.  May repeat in 72 hours if needed.  Dispense:  2,  RF:  None. Stop the Mycolog II.

## 2018-06-10 NOTE — Telephone Encounter (Signed)
Return call to patient. Per DPR, can leave message on voice mail.  Left message to call back to triage nurse.

## 2018-06-10 NOTE — Telephone Encounter (Signed)
Left message to call Noreene Larsson, RN at [facility] [phone].   May Speak with any available triage nurse

## 2018-06-10 NOTE — Telephone Encounter (Signed)
Patient sent the following correspondence through MyChart. Routing to triage to assist patient with request.  Hi Dr. Edward Jolly,    Thank you for letting me know the test results. I am feeling better, though the symptoms have not completely resolved. I have taken both Diflucan. Just wanted to make sure this is okay or if I should be doing anything else at this point.    Thank you!

## 2018-06-10 NOTE — Telephone Encounter (Signed)
Call to patient regarding My Chart message.  States she took Diflucan on Wednesday and repeated on Saturday. Still has burning and extrernal skin discomfort. Painful when urine touches skin or to touch to wipe. Nystatin burns when applied then helps some.   Continues to have increased vaginal discharge.    Please advise on next step.

## 2018-06-10 NOTE — Telephone Encounter (Signed)
Patient returned call

## 2018-06-10 NOTE — Telephone Encounter (Signed)
Patient sent the following correspondence through MyChart. Routing to triage to assist patient with request.  Hi Again,    I sent a message last night thinking it was on the way better but today the burning is back a lot.  Not sure what to do!  Thanks!

## 2018-06-11 MED ORDER — FLUCONAZOLE 150 MG PO TABS: ORAL_TABLET | ORAL | 0 refills | Status: DC

## 2018-06-11 NOTE — Telephone Encounter (Signed)
Patient returned call and message from Dr. Edward Jolly given.  She will stop mycolog cream, continue to avoid vaginal estrogen.  Diflucan 150 mg PO x 1 and repeat in 3 days if symptoms not resolved.   Pt aware to call back with any concerns.  Encounter closed.

## 2018-06-28 DIAGNOSIS — G540 Brachial plexus disorders: Secondary | ICD-10-CM | POA: Diagnosis not present

## 2018-07-17 ENCOUNTER — Telehealth: Payer: Self-pay | Admitting: Obstetrics and Gynecology

## 2018-07-17 ENCOUNTER — Encounter: Payer: Self-pay | Admitting: Obstetrics and Gynecology

## 2018-07-17 NOTE — Telephone Encounter (Signed)
Patient sent the following correspondence through MyChart. Routing to triage to assist patient with request.  Could you please send in a prescription for more Diflucan? I have another yeast infection. Also, the minivelle That I asked you to call Larita FifeLynn is $70 per month. Would you be able to call in a refill for the .01 generic estradiol? I don't have enough to last me until January when I see you again. Thank you!

## 2018-07-17 NOTE — Telephone Encounter (Signed)
Left message to call Ernestine Langworthy, RN at [facility] [phone].   

## 2018-07-17 NOTE — Telephone Encounter (Signed)
Spoke with patient.   1. Patient requesting diflucan RX. Patient tx for yeast on 10/23 and 10/28, symptoms resolved after second treatment. Using vaginal estrogen cream twice weekly and started mycolog cream 2 days ago. Reports vaginal itching and white vaginal d/c started 3 days ago. Advised OV needed for further evaluation, patient declined. Patient states she will be going out of town, will try "something OTC".   2. Patient has current Rx for Minivelle 0.1 mg patch, asking if RX can be sent for estradiol 0.1mg  patch? Advised patient Rx can be filled as brand or generic, f/u with pharmacy to compare cost. If additional Rx still needed return call to office.   Advised Dr. Edward JollySilva will review, our office will return call if any additional recommendations.   Routing to provider for final review. Patient is agreeable to disposition. Will close encounter.

## 2018-07-17 NOTE — Telephone Encounter (Signed)
Patient returning call.

## 2018-07-18 DIAGNOSIS — M25512 Pain in left shoulder: Secondary | ICD-10-CM | POA: Diagnosis not present

## 2018-08-21 ENCOUNTER — Ambulatory Visit: Payer: BLUE CROSS/BLUE SHIELD | Admitting: Obstetrics and Gynecology

## 2018-08-26 NOTE — Progress Notes (Signed)
48 y.o. G46P0 Married Caucasian female here for annual exam.    Wants to try vagina estradiol tablets instead of estrace cream.   Did not do well with Wellbutrin, which has increased with her anxiety.  This increased her anxiety.  She talked to her pharmacist who suggested not doing the extended release and going with a lower dose.  Took Zoloft in the past and this increased her anxiety.  Amytryptaline made her depressed in the past.   Feels good on her HRT.  No hot flashes.   She is using testosterone three times a week.  One click.  Still has 1/2 of her prescription.  Doing well on her current dosage.   PCP:   Merri Brunette, MD  Patient's last menstrual period was 12/16/2013 (exact date).           Sexually active: Yes.    The current method of family planning is post menopausal status.    Exercising: Yes.    strength, yoga, pilates, cardio Smoker: former  Health Maintenance: Pap: 08/2017 at [facility]--normal per patient History of abnormal Pap:  no MMG:  08-20-17 Neg/density C/BiRads2--[facility] Colonoscopy:  never BMD:   About 2 years ago  Result  Normal per patient at [facility] TDaP:  2001 per patient's chart Gardasil:   no HIV: 07-03-13 NR Hep C: 07-03-13 Neg Screening Labs:  Discuss today    reports that she quit smoking about 23 years ago. She has never used smokeless tobacco. She reports current alcohol use. She reports that she does not use drugs.  Past Medical History:  Diagnosis Date  . GERD (gastroesophageal reflux disease)   . Group A streptococcal infection   . Hormone disorder   . Pericardial effusion   . Secondary cardiomyopathy (HCC)   . Toxic shock (HCC)   . Toxic shock [facility])     Past Surgical History:  Procedure Laterality Date  . ANTERIOR CERVICAL DECOMP/DISCECTOMY FUSION  11/18/2009   C5-7  . CARPAL TUNNEL RELEASE Left   . CESAREAN SECTION  2008   complete placenta pervia, hemmorage  . CHEILECTOMY Left 05/01/2013   Procedure: LEFT  GREAT TOE HALLUX RIGIDUS WITH CHEILECTOMY 1ST METATARSOPHALANGEAL;  Surgeon: Loreta Ave, MD;  Location: [facility];  Service: Orthopedics;  Laterality: Left;  . CHEILECTOMY Right 06/02/2013   procedule:right great toe chielectomy  . HYSTEROSCOPY  10/2017   done by Dr. Pennie Rushing   . I&D EXTREMITY Right 07/02/2013   Procedure: IRRIGATION AND DEBRIDEMENT RIGHT 1ST Metaphalangeal JOINT;  Surgeon: Loreta Ave, MD;  Location: [facility] OR;  Service: Orthopedics;  Laterality: Right;  . KNEE SURGERY  [phone]   7 surgeries left knee(2 lateral releases, 1 tibial tubercle oxtomy, 1 patellectomy)    Current Outpatient Medications  Medication Sig Dispense Refill  . buPROPion (WELLBUTRIN XL) 150 MG 24 hr tablet TAKE 1 TABLET BY MOUTH EVERY DAY 90 tablet 0  . estradiol (ESTRACE) 0.1 MG/GM vaginal cream INSERT 1/2 GRAM VAGINALLY TWICE A WEEK  12  . estradiol (MINIVELLE) 0.1 MG/24HR patch Place 1 patch (0.1 mg total) onto the skin 2 (two) times a week. 24 patch 0  . fluconazole (DIFLUCAN) 150 MG tablet Take one tablet (150mg ) now, may repeat in 72 hours if symptoms not resolved. 2 tablet 0  . NONFORMULARY OR COMPOUNDED ITEM Testosterone 2% cream   Apply 1 click (0.5 gram) behind knee or inner thigh daily. Dispense 30 grams. 30 each 0  . nystatin-triamcinolone (MYCOLOG II) cream Apply 1  application topically 2 (two) times daily. Apply to affected area BID for up to 7 days. 60 g 0  . progesterone (PROMETRIUM) 100 MG capsule Take 1 capsule (100 mg total) by mouth at bedtime. 90 capsule 1   No current facility-administered medications for this visit.     Family History  Problem Relation Age of Onset  . Hypertension Father   . Alzheimer's disease Father   . Hypertension Mother   . Hyperlipidemia Mother   . Alzheimer's disease Mother   . Migraines Sister   . Kidney disease Maternal Grandmother   . Heart attack Maternal Grandfather 63    Review of Systems  Constitutional:       Weight  gain  HENT: Negative.   Eyes: Negative.   Respiratory: Negative.   Cardiovascular: Negative.   Gastrointestinal: Negative.   Endocrine: Negative.   Genitourinary: Negative.   Musculoskeletal: Negative.   Skin: Negative.   Allergic/Immunologic: Negative.   Neurological: Negative.   Hematological: Negative.   Psychiatric/Behavioral: Negative.     Exam:   LMP 12/16/2013 (Exact Date)     General appearance: alert, cooperative and appears stated age Head: Normocephalic, without obvious abnormality, atraumatic Neck: no adenopathy, supple, symmetrical, trachea midline and thyroid normal to inspection and palpation Lungs: clear to auscultation bilaterally Breasts: normal appearance, no masses or tenderness, No nipple retraction or dimpling, No nipple discharge or bleeding, No axillary or supraclavicular adenopathy Heart: regular rate and rhythm Abdomen: soft, non-tender; no masses, no organomegaly Extremities: extremities normal, atraumatic, no cyanosis or edema Skin: Skin color, texture, turgor normal. No rashes or lesions Lymph nodes: Cervical, supraclavicular, and axillary nodes normal. No abnormal inguinal nodes palpated Neurologic: Grossly normal  Pelvic: External genitalia:  no lesions              Urethra:  normal appearing urethra with no masses, tenderness or lesions              Bartholins and Skenes: normal                 Vagina: normal appearing vagina with normal color and discharge, no lesions              Cervix: no lesions              Pap taken: No. Bimanual Exam:  Uterus:  normal size, contour, position, consistency, mobility, non-tender              Adnexa: no mass, fullness, tenderness              Rectal exam: Yes.  .  Confirms.              Anus:  normal sphincter tone, no lesions  Chaperone was present for exam.  Assessment:   Well woman visit with normal exam. Premature menopause.  HRT with testosterone replacement.  Migraine with aura.  Does not  currently have migraines.  Hypothyroidism.  Off Nature thyroid.   Plan: Mammogram screening.  She will update her mammogram and knows the importance of this for continuing with her hormones.  Recommended self breast awareness. Pap and HR HPV as above. Guidelines for Calcium, Vitamin D, regular exercise program including cardiovascular and weight bearing exercise. Continue HRT and switch from vaginal estrogen cream to Vagifem.  Instructed in use for the Vagifem.  Routine labs including TFTs.  Will check testosterone levels.  IFOB. Follow up annually and prn.   After visit summary provided.

## 2018-08-28 ENCOUNTER — Encounter: Payer: Self-pay | Admitting: Obstetrics and Gynecology

## 2018-08-28 ENCOUNTER — Ambulatory Visit (INDEPENDENT_AMBULATORY_CARE_PROVIDER_SITE_OTHER): Payer: BLUE CROSS/BLUE SHIELD | Admitting: Obstetrics and Gynecology

## 2018-08-28 ENCOUNTER — Other Ambulatory Visit: Payer: Self-pay | Admitting: Obstetrics and Gynecology

## 2018-08-28 ENCOUNTER — Other Ambulatory Visit: Payer: Self-pay

## 2018-08-28 ENCOUNTER — Telehealth: Payer: Self-pay | Admitting: Obstetrics and Gynecology

## 2018-08-28 VITALS — BP 118/60 | HR 80 | Resp 16 | Ht 66.0 in | Wt 166.0 lb

## 2018-08-28 DIAGNOSIS — Z79890 Hormone replacement therapy (postmenopausal): Secondary | ICD-10-CM | POA: Diagnosis not present

## 2018-08-28 DIAGNOSIS — Z5181 Encounter for therapeutic drug level monitoring: Secondary | ICD-10-CM | POA: Diagnosis not present

## 2018-08-28 DIAGNOSIS — Z01419 Encounter for gynecological examination (general) (routine) without abnormal findings: Secondary | ICD-10-CM | POA: Diagnosis not present

## 2018-08-28 DIAGNOSIS — Z1211 Encounter for screening for malignant neoplasm of colon: Secondary | ICD-10-CM

## 2018-08-28 MED ORDER — BUPROPION HCL 75 MG PO TABS: ORAL_TABLET | ORAL | 11 refills | Status: DC

## 2018-08-28 MED ORDER — PROGESTERONE MICRONIZED 100 MG PO CAPS: 100.0000 mg | ORAL_CAPSULE | Freq: Every day | ORAL | 3 refills | Status: DC

## 2018-08-28 MED ORDER — ESTRADIOL 0.1 MG/24HR TD PTTW: 1.0000 | MEDICATED_PATCH | TRANSDERMAL | 3 refills | Status: DC

## 2018-08-28 MED ORDER — ESTRADIOL 10 MCG VA TABS: 1.0000 | ORAL_TABLET | VAGINAL | 3 refills | Status: DC

## 2018-08-28 MED ORDER — BUPROPION HCL ER (SR) 150 MG PO TB12: ORAL_TABLET | ORAL | 11 refills | Status: DC

## 2018-08-28 NOTE — Patient Instructions (Signed)

## 2018-08-28 NOTE — Telephone Encounter (Signed)
Routing to Dr. Edward Jolly as patient is requesting change in Wellbutrin Rx.

## 2018-08-28 NOTE — Telephone Encounter (Signed)
Order changed per Dr. Edward Jolly.   DC 75 mg tab order.  New order placed per written order from Dr. Edward Jolly.  Notified patient via mychart.  Encounter closed.

## 2018-08-28 NOTE — Telephone Encounter (Signed)
Message   Hi Dr. Edward Jolly,    So sorry to bother you but when I got back to work I looked down at the note the pharmacist wrote and I realized I gave you the wrong information. She told me I could do the 150mg  SR (not XL) Wellbutrin and cut it in half for the first week or two so that I was only getting 75 mg initially. That way, I am still only taking it once per day in the morning, even when I increase to the 150mg  dose. I hope that makes sense!! Is there any way to change the prescription? I'm sorry! I hope that doesn't cause an issue.    Thanks again!    Denaisha

## 2018-08-28 NOTE — Telephone Encounter (Signed)
Ok to change prescription to Wellbutrin SR 150 mg.  Take 1/2 tablet (75 mg) my mouth daily in am.  Can increase to 1 full tablet (150 mg) by mouth daily in 1 to 2 weeks if tolerated.   Ok for 30 tablets with 11 refills.

## 2018-08-30 ENCOUNTER — Other Ambulatory Visit: Payer: Self-pay | Admitting: Internal Medicine

## 2018-08-30 DIAGNOSIS — Z1231 Encounter for screening mammogram for malignant neoplasm of breast: Secondary | ICD-10-CM

## 2018-09-01 LAB — COMPREHENSIVE METABOLIC PANEL
A/G RATIO: 1.8 (ref 1.2–2.2)
ALBUMIN: 4.4 g/dL (ref 3.5–5.5)
ALK PHOS: 61 IU/L (ref 39–117)
ALT: 16 IU/L (ref 0–32)
AST: 16 IU/L (ref 0–40)
BILIRUBIN TOTAL: 0.8 mg/dL (ref 0.0–1.2)
BUN / CREAT RATIO: 24 — AB (ref 9–23)
BUN: 20 mg/dL (ref 6–24)
CHLORIDE: 99 mmol/L (ref 96–106)
CO2: 23 mmol/L (ref 20–29)
Calcium: 9.4 mg/dL (ref 8.7–10.2)
Creatinine, Ser: 0.82 mg/dL (ref 0.57–1.00)
GFR calc non Af Amer: 86 mL/min/{1.73_m2} (ref >59)
GFR, EST AFRICAN AMERICAN: 99 mL/min/{1.73_m2} (ref >59)
GLUCOSE: 70 mg/dL (ref 65–99)
Globulin, Total: 2.4 g/dL (ref 1.5–4.5)
POTASSIUM: 4.3 mmol/L (ref 3.5–5.2)
Sodium: 139 mmol/L (ref 134–144)
TOTAL PROTEIN: 6.8 g/dL (ref 6.0–8.5)

## 2018-09-01 LAB — LIPID PANEL
CHOL/HDL RATIO: 3.3 ratio (ref 0.0–4.4)
Cholesterol, Total: 272 mg/dL — ABNORMAL HIGH (ref 100–199)
HDL: 83 mg/dL (ref >39)
LDL Calculated: 168 mg/dL — ABNORMAL HIGH (ref 0–99)
Triglycerides: 104 mg/dL (ref 0–149)
VLDL Cholesterol Cal: 21 mg/dL (ref 5–40)

## 2018-09-01 LAB — TESTOSTERONE, FREE, DIRECT
Testosterone, Free: 2.4 pg/mL (ref 0.0–4.2)
Testosterone, Total, LC/MS: 61.3 ng/dL

## 2018-09-01 LAB — VITAMIN D 25 HYDROXY (VIT D DEFICIENCY, FRACTURES): VIT D 25 HYDROXY: 20 ng/mL — AB (ref 30.0–100.0)

## 2018-09-01 LAB — CBC
Hematocrit: 41.7 % (ref 34.0–46.6)
Hemoglobin: 13.8 g/dL (ref 11.1–15.9)
MCH: 29.6 pg (ref 26.6–33.0)
MCHC: 33.1 g/dL (ref 31.5–35.7)
MCV: 89 fL (ref 79–97)
PLATELETS: 270 10*3/uL (ref 150–450)
RBC: 4.67 x10E6/uL (ref 3.77–5.28)
RDW: 12.5 % (ref 11.7–15.4)
WBC: 5.1 10*3/uL (ref 3.4–10.8)

## 2018-09-01 LAB — TSH: TSH: 2.73 u[IU]/mL (ref 0.450–4.500)

## 2018-09-01 LAB — T4, FREE: FREE T4: 1.18 ng/dL (ref 0.82–1.77)

## 2018-09-04 ENCOUNTER — Telehealth: Payer: Self-pay | Admitting: Obstetrics and Gynecology

## 2018-09-04 ENCOUNTER — Encounter: Payer: Self-pay | Admitting: Obstetrics and Gynecology

## 2018-09-04 NOTE — Telephone Encounter (Signed)
Patient sent the following correspondence through MyChart. Routing to triage to assist patient with request.  Hi!  Just wondering if my lab work is back?  Thanks!

## 2018-09-04 NOTE — Telephone Encounter (Signed)
Left message to call Karman Veney, RN at [facility] [phone].   

## 2018-09-04 NOTE — Telephone Encounter (Signed)
I will send through the results note to triage.

## 2018-09-04 NOTE — Telephone Encounter (Signed)
Patient returned call to nurse Jill. °

## 2018-09-04 NOTE — Telephone Encounter (Signed)
Routing to Dr. Edward Jolly to review labs dated 08/28/18 and advise.

## 2018-09-04 NOTE — Telephone Encounter (Signed)
Spoke with patient, advised as seen below per Dr. Edward Jolly. Patient plans to continue current testosterone therapy, does not need a refill at this time, will call when needed. Patient verbalizes understanding and is agreeable.   Encounter closed.

## 2018-09-04 NOTE — Telephone Encounter (Signed)
-----   Message from Patton Salles, MD sent at 09/04/2018  3:03 PM EST ----- Please contact patient with results of testing.  There is also a phone note.   Her testosterone took a while to return.  Her free testosterone is normal, and the total is just above the normal range.  I would continue her current dosage of testosterone if she wishes to continue with this therapy.  Her LDL cholesterol has gone up, but her ratios are still in a good range due to her high HDL cholesterol.  The cholesterol may be elevated due to her testosterone therapy.  A low cholesterol diet may help to lower the LDL cholesterol.  Her vit D is low, and I am recommending vit D3 1000 IU daily.  Her thyroid function, blood counts, and blood chemistries look good.

## 2018-09-14 LAB — FECAL OCCULT BLOOD, IMMUNOCHEMICAL: Fecal Occult Bld: NEGATIVE

## 2018-09-21 ENCOUNTER — Other Ambulatory Visit: Payer: Self-pay | Admitting: Obstetrics and Gynecology

## 2018-09-23 NOTE — Telephone Encounter (Signed)
Request from the pharmacy for Wellbutrin 90-day supply. Prescription was sent to pharmacy 08/28/18 #30 w/11 refills. Prescription switched to #90 w/3 refills. Closing encounter.

## 2018-10-14 ENCOUNTER — Ambulatory Visit
Admission: RE | Admit: 2018-10-14 | Discharge: 2018-10-14 | Disposition: A | Discharge status: Home / Self Care | Payer: BLUE CROSS/BLUE SHIELD | Source: Ambulatory Visit | Attending: Internal Medicine | Admitting: Internal Medicine

## 2018-10-14 DIAGNOSIS — Z1231 Encounter for screening mammogram for malignant neoplasm of breast: Secondary | ICD-10-CM

## 2018-10-18 ENCOUNTER — Telehealth: Payer: Self-pay | Admitting: Obstetrics and Gynecology

## 2018-10-18 NOTE — Telephone Encounter (Signed)
Patient sent the following correspondence through MyChart. Routing to triage to assist patient with request.  Appointment Request From: Shelly [name]    With Provider: [name], MD [facility]]    Preferred Date Range: 10/28/2018 - 11/04/2018    Preferred Times: Any time    Reason for visit: Request an Appointment    Comments:  Fatigue and breast tenderness.    Last seen: 08/28/18

## 2018-10-18 NOTE — Telephone Encounter (Signed)
Left message to call Sharron Petruska, RN at [facility] [phone].   

## 2018-10-23 NOTE — Telephone Encounter (Signed)
Mychart message to patient. Declines earlier appointment due to out of town.  Encounter to Dr. Edward Jolly and will close.

## 2018-10-30 ENCOUNTER — Ambulatory Visit: Payer: BLUE CROSS/BLUE SHIELD | Admitting: Obstetrics and Gynecology

## 2018-10-30 ENCOUNTER — Other Ambulatory Visit: Payer: Self-pay

## 2018-10-30 ENCOUNTER — Encounter: Payer: Self-pay | Admitting: Obstetrics and Gynecology

## 2018-10-30 VITALS — BP 110/64 | HR 64 | Resp 14 | Ht 66.0 in | Wt 169.0 lb

## 2018-10-30 DIAGNOSIS — N760 Acute vaginitis: Secondary | ICD-10-CM | POA: Diagnosis not present

## 2018-10-30 DIAGNOSIS — N644 Mastodynia: Secondary | ICD-10-CM

## 2018-10-30 DIAGNOSIS — R4586 Emotional lability: Secondary | ICD-10-CM

## 2018-10-30 MED ORDER — BUPROPION HCL ER (XL) 150 MG PO TB24: 150.0000 mg | ORAL_TABLET | Freq: Every day | ORAL | 0 refills | Status: DC

## 2018-10-30 NOTE — Progress Notes (Signed)
rm GYNECOLOGY  VISIT   HPI: 48 y.o.   Married  Caucasian  female   G46P0 with Patient's last menstrual period was 12/16/2013 (exact date).   here for breast tenderness, mood swings, vaginal burning and fatigue.    Breast tenderness bilaterally for 3 - 4 weeks.  Right side greater than left.   Wants to sleep all the time.  Feels annoyed.  Has not started Wellbutrin due to fear for side effects.   Using vagifem once weekly or twice weekly.  May be having more discharge but is having burning.   Normal TFTs in January.  Total testosterone was 61.3 and free testosterone 2.4. Has not taking testosterone since January.   Denies vaginal bleeding.   Patient is a Psychologist, educational.  GYNECOLOGIC HISTORY: Patient's last menstrual period was 12/16/2013 (exact date). Contraception: Postmenopausal Menopausal hormone therapy:  Minivelle, Progesterone, and Vagifem Last mammogram:  10/14/18 BIRADS 1 negative/density d Last pap smear:   08/2017 at [facility]--normal per patient        OB History    Gravida  4   Para      Term      Preterm      AB      Living  4     SAB      TAB      Ectopic      Multiple      Live Births                 Patient Active Problem List   Diagnosis Date Noted  . Recurrent infections 11/03/2013  . Lung mass 07/31/2013  . Toxic shock (HCC) 07/17/2013  . Group A streptococcal infection   . Pericardial effusion   . SIRS (systemic inflammatory response syndrome) (HCC) 07/01/2013  . Right foot infection 07/01/2013  . Hypotension 07/01/2013  . Tachycardia 07/01/2013  . Septic shock (HCC) 07/01/2013  . Tinea versicolor 11/07/2010  . Constipation 11/07/2010  . Radiculopathy of cervical spine 11/07/2010  . OTHER MALAISE AND FATIGUE 09/01/2010  . MITRAL VALVE PROLAPSE 10/07/2009  . MIGRAINE WITH AURA 02/20/2008  . GERD 02/20/2008    Past Medical History:  Diagnosis Date  . GERD (gastroesophageal reflux disease)   . Group A streptococcal infection    . Hormone disorder   . Pericardial effusion   . Secondary cardiomyopathy (HCC)   . Toxic shock (HCC)   . Toxic shock [facility] [address])     Past Surgical History:  Procedure Laterality Date  . ANTERIOR CERVICAL DECOMP/DISCECTOMY FUSION  11/18/2009   C5-7  . CARPAL TUNNEL RELEASE Left   . CESAREAN SECTION  2008   complete placenta pervia, hemmorage  . CHEILECTOMY Left 05/01/2013   Procedure: LEFT GREAT TOE HALLUX RIGIDUS WITH CHEILECTOMY 1ST METATARSOPHALANGEAL;  Surgeon: Loreta Ave, MD;  Location: [facility];  Service: Orthopedics;  Laterality: Left;  . CHEILECTOMY Right 06/02/2013   procedule:right great toe chielectomy  . HYSTEROSCOPY  10/2017   done by Dr. Pennie Rushing   . I&D EXTREMITY Right 07/02/2013   Procedure: IRRIGATION AND DEBRIDEMENT RIGHT 1ST Metaphalangeal JOINT;  Surgeon: Loreta Ave, MD;  Location: [facility] OR;  Service: Orthopedics;  Laterality: Right;  . KNEE SURGERY  [phone]   7 surgeries left knee(2 lateral releases, 1 tibial tubercle oxtomy, 1 patellectomy)    Current Outpatient Medications  Medication Sig Dispense Refill  . buPROPion (WELLBUTRIN SR) 150 MG 12 hr tablet TAKE 1/2 TABLET IN AM. CAN INCREASE TO 1 FULL TABLET BY MOUTH  DAILY IN 1 TO 2 WEEKS IF TOLERATED. 90 tablet 3  . estradiol (MINIVELLE) 0.1 MG/24HR patch Place 1 patch (0.1 mg total) onto the skin 2 (two) times a week. 24 patch 3  . Estradiol (VAGIFEM) 10 MCG TABS vaginal tablet Place 1 tablet (10 mcg total) vaginally 2 (two) times a week. Use every night before bed for two weeks when you first begin this medicine, then after the first two weeks, begin using it twice a week. 34 tablet 3  . Multiple Vitamins-Minerals (MULTIVITAMIN ADULT PO) Take 1 tablet by mouth daily.    . NONFORMULARY OR COMPOUNDED ITEM Testosterone 2% cream   Apply 1 click (0.5 gram) behind knee or inner thigh daily. Dispense 30 grams. 30 each 0  . progesterone (PROMETRIUM) 100 MG capsule Take 1 capsule (100 mg total) by  mouth at bedtime. 90 capsule 3   No current facility-administered medications for this visit.      ALLERGIES: Dilaudid [hydromorphone hcl] and Codeine  Family History  Problem Relation Age of Onset  . Hypertension Father   . Alzheimer's disease Father   . Hypertension Mother   . Hyperlipidemia Mother   . Alzheimer's disease Mother   . Migraines Sister   . Kidney disease Maternal Grandmother   . Heart attack Maternal Grandfather 86    Social History   Socioeconomic History  . Marital status: Married    Spouse name: Not on file  . Number of children: 4  . Years of education: Not on file  . Highest education level: Not on file  Occupational History  . Occupation: stay at home mom    Employer: UNEMPLOYED  Social Needs  . Financial resource strain: Not on file  . Food insecurity:    Worry: Not on file    Inability: Not on file  . Transportation needs:    Medical: Not on file    Non-medical: Not on file  Tobacco Use  . Smoking status: Former Smoker    Last attempt to quit: 11/03/1994    Years since quitting: 24.0  . Smokeless tobacco: Never Used  Substance and Sexual Activity  . Alcohol use: Yes    Comment: occasionally   . Drug use: No  . Sexual activity: Yes    Birth control/protection: Post-menopausal  Lifestyle  . Physical activity:    Days per week: Not on file    Minutes per session: Not on file  . Stress: Not on file  Relationships  . Social connections:    Talks on phone: Not on file    Gets together: Not on file    Attends religious service: Not on file    Active member of club or organization: Not on file    Attends meetings of clubs or organizations: Not on file    Relationship status: Not on file  . Intimate partner violence:    Fear of current or ex partner: Not on file    Emotionally abused: Not on file    Physically abused: Not on file    Forced sexual activity: Not on file  Other Topics Concern  . Not on file  Social History Narrative    Exercise 2 times weekly      Diet : Fruit, veggies, water    Review of Systems  Constitutional:       Bilateral breast tenderness Mood swings  Fatigue   HENT: Negative.   Eyes: Negative.   Respiratory: Negative.   Cardiovascular: Negative.   Gastrointestinal: Negative.  Endocrine: Negative.   Genitourinary:       Vaginal burning  Musculoskeletal: Negative.   Skin: Negative.   Allergic/Immunologic: Negative.   Neurological: Negative.   Hematological: Negative.   Psychiatric/Behavioral: Negative.     PHYSICAL EXAMINATION:    BP 110/64 (BP Location: Right Arm, Patient Position: Sitting, Cuff Size: Normal)   Pulse 64   Resp 14   Ht 5\' 6"  (1.676 m)   Wt 169 lb (76.7 kg)   LMP 12/16/2013 (Exact Date)   BMI 27.28 kg/m     General appearance: alert, cooperative and appears stated age   Breasts: normal appearance, no masses or tenderness, No nipple retraction or dimpling, No nipple discharge or bleeding, No axillary or supraclavicular adenopathy Left areola with 3 mm cystic area of areola. This is consistent with a sebaceous cyst of the skin.   Pelvic: External genitalia:  no lesions              Urethra:  normal appearing urethra with no masses, tenderness or lesions              Bartholins and Skenes: normal                 Vagina: normal appearing vagina with normal color and discharge, no lesions              Cervix: no lesions                Bimanual Exam:  Uterus:  normal size, contour, position, consistency, mobility, non-tender              Adnexa: no mass, fullness, tenderness           Chaperone was present for exam.  ASSESSMENT  Premature menopause. HRT. Vaginitis.  Vaginal atrophy.  Not using Vagifem consistently.  Breast tenderness.  Migraine with aura. Does not currently have migraines.  Hypothyroidism. Off Nature thyroid.  Mood swings.   PLAN  Reassurance regarding breast exam.  I discussed potential reduction in her dosage of transdermal  estrogen if the breast tenderness persists. We reviewed musculoskeletal pain as a potential contributor to breast/chest wall sensitivity.  Affirm testing.  She will increase her Vagifem to twice weekly.  Start Wellbutrin XL 150 mg daily. She will let me know how she is doing with the medication.  FU prn.    An After Visit Summary was printed and given to the patient.  ___25___ minutes face to face time of which over 50% was spent in counseling.

## 2018-10-31 ENCOUNTER — Telehealth: Payer: Self-pay | Admitting: Obstetrics and Gynecology

## 2018-10-31 ENCOUNTER — Encounter: Payer: Self-pay | Admitting: Obstetrics and Gynecology

## 2018-10-31 LAB — VAGINITIS/VAGINOSIS, DNA PROBE
Candida Species: NEGATIVE
Gardnerella vaginalis: NEGATIVE
Trichomonas vaginosis: NEGATIVE

## 2018-10-31 NOTE — Telephone Encounter (Signed)
More Detail >>  Visit Follow-Up Question  Shelly [name]  Sent: Thu October 31, 2018 3:09 PM  To: [facility] Clinical Pool      Message   Thank you for the lab results. I just have a quick question!  I am going to start the Wellbutrin tomorrow morning. Is it okay to have an occasional glass of wine on this medicine?  Thanks so much for all your help!!  Shelly [name]

## 2018-11-01 NOTE — Telephone Encounter (Signed)
Routing to Dr.Silva for review. 

## 2018-11-01 NOTE — Telephone Encounter (Signed)
To Dr. Edward Jolly to review and advise.

## 2018-11-02 NOTE — Telephone Encounter (Signed)
ETOH use needs to be limited when taking Wellbutrin.

## 2018-11-04 ENCOUNTER — Telehealth: Payer: Self-pay | Admitting: Obstetrics and Gynecology

## 2018-11-04 NOTE — Telephone Encounter (Signed)
Responded to patient via mychart

## 2018-11-04 NOTE — Telephone Encounter (Signed)
Message   Thanks so much! Hope y'all have a great day!  ----- Message -----  From: Nurse Enrique Sack  Sent: 11/04/18, 8:56 AM  To: [name]  Subject: RE: Visit Follow-Up Question    [name],   Per Dr. Edward Jolly, occasional use of alcohol is okay.   Please let us know if you need anything further.     Sincerely.   Davonna Belling, BSN RN-BC  Triage Nurse  [facility]          ----- Message -----   From: [name]   Sent: 10/31/2018 3:09 PM EDT    To: [name], MD  Subject: Visit Follow-Up Question    Thank you for the lab results. I just have a quick question!  I am going to start the Wellbutrin tomorrow morning. Is it okay to have an occasional glass of wine on this medicine?  Thanks so much for all your help!!  Shelly [name]

## 2018-11-10 ENCOUNTER — Encounter: Payer: Self-pay | Admitting: Obstetrics and Gynecology

## 2018-11-11 ENCOUNTER — Telehealth: Payer: Self-pay | Admitting: *Deleted

## 2018-11-11 NOTE — Telephone Encounter (Signed)
Ok to increase to Wellbutrin XL 150 mg, take 2 by mouth daily.   Please let me know if she needs refills.  I am so sorry for the loss of her mother.

## 2018-11-11 NOTE — Telephone Encounter (Signed)
Routing to Dr. Edward Jolly to review and advise.   From [place], [place] "[place]" To Patton Salles, MD Sent 11/10/2018 10:27 PM  Hi Dr. Edward Jolly,   Sorry to bother you again. I have been taking the Wellbutrin XL 150 for about a week with no side effects! Is it okay to go up to 2 tablets in the morning now? I have had some success so far, but my mother passed away this week and it has been extra difficult. I was hoping the increased dose would help with everything going on. Thanks so much!   Shelly [name]

## 2018-11-11 NOTE — Telephone Encounter (Signed)
See telephone encounter dated 11/11/18.

## 2018-11-11 NOTE — Telephone Encounter (Signed)
Spoke with patient, advised as seen below per Dr. Edward Jolly. Patient does not need refill at this time, will try increased dosage, will contact office to provide update and request refills at that time. Patient thankful for return call, verbalizes understanding and is agreeable.   Encounter closed.

## 2018-11-20 ENCOUNTER — Encounter: Payer: Self-pay | Admitting: Obstetrics and Gynecology

## 2018-11-21 ENCOUNTER — Telehealth: Payer: Self-pay | Admitting: Obstetrics and Gynecology

## 2018-11-21 NOTE — Telephone Encounter (Signed)
Spoke with patient. Advised of message as seen below from Dr.Silva. Patient verbalizes understanding.  States she is not having symptoms every day. Wants to continue on her current dose and see how she does for now. Will call if she feels Wellbutrin is not a good fit so that she can be referred to psychiatry.   Routing to provider and will close encounter.

## 2018-11-21 NOTE — Telephone Encounter (Signed)
Message   Hi Dr. Edward Jolly,    I hope you're well. I wrote in several days ago about increasing the Wellbutrin XL dose to 300mg  and while you gave me permission to do so, I waited and haven't yet. I wanted to ask you a question before doing so. I am felt more "keyed up" and irritable over the past week and am wondering if that is a side effect, and if so, if that will go away? I have hesitated to increase the dose in case it gets worse?    Sorry for all the constant questions! Thank you for your help!    Delories

## 2018-11-21 NOTE — Telephone Encounter (Signed)
Routing to Dr.Silva for review. 

## 2018-11-21 NOTE — Telephone Encounter (Signed)
Insomnia, restlessness, and agitation can occur with Wellbutrin at any dosage.  Usually symptoms decrease shortly after starting the medication, like within a week or so.  Increasing the dosage may increase these symptoms.   I recall that Shelly [name]'s mother passed away recently, and she may be having some increased symptoms also due to this.   If she thinks that the Wellbutrin is not a good match for her, I would recommend she establish care with psychiatry as she has had difficulty tolerating different antidepressants.

## 2019-03-12 ENCOUNTER — Encounter: Payer: Self-pay | Admitting: Obstetrics and Gynecology

## 2019-03-12 ENCOUNTER — Telehealth: Payer: Self-pay | Admitting: Obstetrics and Gynecology

## 2019-03-12 NOTE — Telephone Encounter (Signed)
Patient sent the following correspondence through [place]. Routing to triage to assist patient with request.  Hi Dr. Quincy Simmonds,    I wanted to reach out because I would like to stop the Wellbutrin. How do I wean off of it safely?  Thank you!    Shelly [name]

## 2019-03-12 NOTE — Telephone Encounter (Signed)
Routing to Dr. Silva to review and advise.  

## 2019-03-12 NOTE — Telephone Encounter (Signed)
Spoke with patient, advised as seen below per Dr. Silva.  Patient verbalizes understanding and is agreeable.  Encounter closed.  

## 2019-03-12 NOTE — Telephone Encounter (Signed)
I would have her take it every other day for 2 - 3 weeks and then try stopping the medication.

## 2019-03-13 ENCOUNTER — Ambulatory Visit: Payer: BC Managed Care – PPO | Admitting: Physician Assistant

## 2019-03-14 ENCOUNTER — Encounter: Payer: Self-pay | Admitting: Obstetrics and Gynecology

## 2019-03-17 ENCOUNTER — Telehealth: Payer: Self-pay | Admitting: Obstetrics and Gynecology

## 2019-03-17 MED ORDER — PROGESTERONE MICRONIZED 200 MG PO CAPS: 200.0000 mg | ORAL_CAPSULE | Freq: Every day | ORAL | 0 refills | Status: DC

## 2019-03-17 MED ORDER — ESTRADIOL 1 MG PO TABS: 2.0000 mg | ORAL_TABLET | Freq: Every day | ORAL | 0 refills | Status: DC

## 2019-03-17 NOTE — Telephone Encounter (Signed)
Spoke with patient, advised as seen below per Dr. Quincy Simmonds. Rx for estrace and Prometrium to verified pharmacy.  Patient request virtual OV, scheduled for 10/27 at 4pm with Dr. Quincy Simmonds.   Routing to provider for final review. Patient is agreeable to disposition. Will close encounter.

## 2019-03-17 NOTE — Telephone Encounter (Signed)
Patient sent the following correspondence through [place]. Routing to triage to assist patient with request.  Hi Dr. Quincy Simmonds,     Thanks so much for your reply! I think if you think it's okay I would like to increase and just see how that works.   Thank you for replying in a Saturday!    [place]    ----- Message -----  From: Arloa Koh, MD  Sent: 03/15/19, 7:13 AM  To: Cherre Huger  Subject: RE: Visit [place],     The patch is not available in a higher dosage, but you could take a pill instead. We would need to increase your progesterone dosing also.     Let me know if I can help further.     Have a good weekend!    Josefa Half      ----- Message -----    From:Annibelle Gadd    Sent:03/14/2019 4:52 PM EDT     To:Arloa Koh, MD  Subject:Visit Follow-Up Question    Hi,    Sorry to bother you but I have one other question. If I'm still having some symptoms; hot flashes, vaginal dryness, etc, is it possible to increase the dose of my estrogen patch?    Thank you!    [place]

## 2019-03-17 NOTE — Telephone Encounter (Signed)
Ok to change HRT.  Start Estrace 2 mg daily and Prometrium 200 mg daily.  OK for 90 day Rx.  She will need to have a recheck either in office or virtually on this new regimen.

## 2019-03-17 NOTE — Telephone Encounter (Signed)
Routing to Dr. Quincy Simmonds to advise on med change.

## 2019-03-19 ENCOUNTER — Encounter: Payer: Self-pay | Admitting: Obstetrics and Gynecology

## 2019-03-20 ENCOUNTER — Telehealth: Payer: Self-pay | Admitting: Obstetrics and Gynecology

## 2019-03-20 NOTE — Telephone Encounter (Signed)
Left message to call Deyona Soza, RN at [facility] [phone].   

## 2019-03-20 NOTE — Telephone Encounter (Signed)
Patient sent the following correspondence through [place]. Routing to triage to assist patient with request.  Hi Shelly [name],    Sorry to bother you but just wanted to check. I have been on the 01 patch and we were increasing the dose. I see you prescribed the 1mg  estradiol pill. Is that the same dosing as the patch or is the pill a stronger dose at 1mg ?    Thanks so much for all your help!    Shelly [name]

## 2019-03-27 NOTE — Telephone Encounter (Signed)
Left message to call Jermain Curt, RN at [facility] [phone].   MyChart message to patient.     

## 2019-04-18 ENCOUNTER — Telehealth: Payer: Self-pay | Admitting: Obstetrics and Gynecology

## 2019-04-18 NOTE — Telephone Encounter (Signed)
Spoke with patient. Patient Reports internal and external vaginal itching that started 2 wks ago, skin feels raw. Has used OTC Monistat 3day x2 with no relief. Denies vaginal d/c, bleeding, urinary symptoms or odor. Recently increased Estradiol to 2mg  daily and Prometrium 200mg  daily . Patient stopped using her vagifem, now unsure if she was supposed to.   Reviewed previous encounters, do not see where patient was advised to stop vagifem. Advised OV recommended for further evaluation of symptoms. OV scheduled for 9am on 9/8 with Melvia Heaps, CNM. Advised I will review vagifem use with Dr. Quincy Simmonds and return call with recommendations. Reviewed use of coconut oil for relief of itching.   Dr. Quincy Simmonds -please advise on vagifem.

## 2019-04-18 NOTE — Telephone Encounter (Signed)
Patient thinks she may have an infection and would like an appointment today if possible. To triage to assist with scheduling.

## 2019-04-18 NOTE — Telephone Encounter (Signed)
Spoke with patient, advised per Dr. Quincy Simmonds. OV r/s to 9/8 at 4pm with Dr. Quincy Simmonds.   Routing to provider for final review. Patient is agreeable to disposition. Will close encounter.

## 2019-04-18 NOTE — Telephone Encounter (Signed)
Patient may use the vagifem twice weekly.  I would like to see her if possible in the afternoon on 04/22/19.

## 2019-04-22 ENCOUNTER — Ambulatory Visit: Payer: Self-pay | Admitting: Certified Nurse Midwife

## 2019-04-22 ENCOUNTER — Other Ambulatory Visit: Payer: Self-pay | Admitting: *Deleted

## 2019-04-22 ENCOUNTER — Ambulatory Visit: Payer: BC Managed Care – PPO | Admitting: Physician Assistant

## 2019-04-22 ENCOUNTER — Other Ambulatory Visit: Payer: Self-pay

## 2019-04-22 ENCOUNTER — Encounter: Payer: Self-pay | Admitting: Obstetrics and Gynecology

## 2019-04-22 ENCOUNTER — Ambulatory Visit: Payer: BC Managed Care – PPO | Admitting: Obstetrics and Gynecology

## 2019-04-22 VITALS — BP 110/64 | HR 70 | Temp 97.9°F | Resp 16 | Ht 65.5 in | Wt 167.6 lb

## 2019-04-22 DIAGNOSIS — N952 Postmenopausal atrophic vaginitis: Secondary | ICD-10-CM | POA: Diagnosis not present

## 2019-04-22 DIAGNOSIS — N760 Acute vaginitis: Secondary | ICD-10-CM | POA: Diagnosis not present

## 2019-04-22 MED ORDER — ESTRADIOL 2 MG VA RING: 2.0000 mg | VAGINAL_RING | VAGINAL | 1 refills | Status: DC

## 2019-04-22 NOTE — Progress Notes (Signed)
GYNECOLOGY  VISIT   HPI: 48 y.o.   Married  Caucasian  female   744P0 with Patient's last menstrual period was 12/16/2013 (exact date).   here for vaginal itching/irritation.  Profuse itching.  No discharge.  No odor.   After one week, she did a Monistat 3.  By day 5, it was not better, but now is feeling back to normal.   She is not remembering the Vagifem well.    She has internal burning since menopause began.  Not major discomfort, but it is there. She has burning with intercourse.  She is not having chronic vulvar burning.   She is taking estradiol 2 mg, and is doing better.  Taking Prometrium 200 mg at hs, and this is really helping to sleep.   Glucose 70 on 08/28/18.  HIV negative on 07/03/13.  GYNECOLOGIC HISTORY: Patient's last menstrual period was 12/16/2013 (exact date). Contraception: Postmenopausal Menopausal hormone therapy:  Estrace, Progesterone and Vagifem Last mammogram: 10/14/18 BIRADS 1 negative/density d Last pap smear:  08/2017 at [facility]--normal per patient        OB History    Gravida  4   Para      Term      Preterm      AB      Living  4     SAB      TAB      Ectopic      Multiple      Live Births                 Patient Active Problem List   Diagnosis Date Noted  . Recurrent infections 11/03/2013  . Lung mass 07/31/2013  . Toxic shock (HCC) 07/17/2013  . Group A streptococcal infection   . Pericardial effusion   . SIRS (systemic inflammatory response syndrome) (HCC) 07/01/2013  . Right foot infection 07/01/2013  . Hypotension 07/01/2013  . Tachycardia 07/01/2013  . Septic shock (HCC) 07/01/2013  . Tinea versicolor 11/07/2010  . Constipation 11/07/2010  . Radiculopathy of cervical spine 11/07/2010  . OTHER MALAISE AND FATIGUE 09/01/2010  . MITRAL VALVE PROLAPSE 10/07/2009  . MIGRAINE WITH AURA 02/20/2008  . GERD 02/20/2008    Past Medical History:  Diagnosis Date  . GERD (gastroesophageal reflux disease)   .  Group A streptococcal infection   . Hormone disorder   . Pericardial effusion   . Secondary cardiomyopathy (HCC)   . Toxic shock (HCC)   . [facility]     Past Surgical History:  Procedure Laterality Date  . ANTERIOR CERVICAL DECOMP/DISCECTOMY FUSION  11/18/2009   C5-7  . CARPAL TUNNEL RELEASE Left   . CESAREAN SECTION  2008   complete placenta pervia, hemmorage  . CHEILECTOMY Left 05/01/2013   Procedure: LEFT GREAT TOE HALLUX RIGIDUS WITH CHEILECTOMY 1ST METATARSOPHALANGEAL;  Surgeon: Loreta Aveaniel F Murphy, MD;  Location: [facility];  Service: Orthopedics;  Laterality: Left;  . CHEILECTOMY Right 06/02/2013   procedule:right great toe chielectomy  . HYSTEROSCOPY  10/2017   done by Dr. Pennie RushingHaygood   . I&D EXTREMITY Right 07/02/2013   Procedure: IRRIGATION AND DEBRIDEMENT RIGHT 1ST Metaphalangeal JOINT;  Surgeon: Loreta Aveaniel F Murphy, MD;  [facility];  Service: Orthopedics;  Laterality: Right;  . KNEE SURGERY  [phone]   7 surgeries left knee(2 lateral releases, 1 tibial tubercle oxtomy, 1 patellectomy)    Current Outpatient Medications  Medication Sig Dispense Refill  . buPROPion (WELLBUTRIN XL) 150 MG 24 hr tablet Take  1 tablet (150 mg total) by mouth daily. (Patient taking differently: Take 150 mg by mouth daily. Patient weaning off) 90 tablet 0  . estradiol (ESTRACE) 1 MG tablet Take 2 tablets (2 mg total) by mouth daily. 180 tablet 0  . Estradiol (VAGIFEM) 10 MCG TABS vaginal tablet Place 1 tablet (10 mcg total) vaginally 2 (two) times a week. Use every night before bed for two weeks when you first begin this medicine, then after the first two weeks, begin using it twice a week. 34 tablet 3  . Multiple Vitamins-Minerals (MULTIVITAMIN ADULT PO) Take 1 tablet by mouth daily.    . NONFORMULARY OR COMPOUNDED ITEM Testosterone 2% cream   Apply 1 click (0.5 gram) behind knee or inner thigh daily. Dispense 30 grams. 30 each 0  . progesterone (PROMETRIUM) 200 MG capsule Take  1 capsule (200 mg total) by mouth daily. 90 capsule 0   No current facility-administered medications for this visit.      ALLERGIES: Dilaudid [hydromorphone hcl] and Codeine  Family History  Problem Relation Age of Onset  . Hypertension Father   . Alzheimer's disease Father   . Hypertension Mother   . Hyperlipidemia Mother   . Alzheimer's disease Mother   . Migraines Sister   . Kidney disease Maternal Grandmother   . Heart attack Maternal Grandfather 63    Social History   Socioeconomic History  . Marital status: Married    Spouse name: Not on file  . Number of children: 4  . Years of education: Not on file  . Highest education level: Not on file  Occupational History  . Occupation: stay at home mom    Employer: UNEMPLOYED  Social Needs  . Financial resource strain: Not on file  . Food insecurity    Worry: Not on file    Inability: Not on file  . Transportation needs    Medical: Not on file    Non-medical: Not on file  Tobacco Use  . Smoking status: Former Smoker    Quit date: 11/03/1994    Years since quitting: 24.4  . Smokeless tobacco: Never Used  Substance and Sexual Activity  . Alcohol use: Yes    Comment: occasionally   . Drug use: No  . Sexual activity: Yes    Birth control/protection: Post-menopausal  Lifestyle  . Physical activity    Days per week: Not on file    Minutes per session: Not on file  . Stress: Not on file  Relationships  . Social Herbalist on phone: Not on file    Gets together: Not on file    Attends religious service: Not on file    Active member of club or organization: Not on file    Attends meetings of clubs or organizations: Not on file    Relationship status: Not on file  . Intimate partner violence    Fear of current or ex partner: Not on file    Emotionally abused: Not on file    Physically abused: Not on file    Forced sexual activity: Not on file  Other Topics Concern  . Not on file  Social History  Narrative   Exercise 2 times weekly      Diet : Fruit, veggies, water    Review of Systems  All other systems reviewed and are negative.   PHYSICAL EXAMINATION:    BP 110/64   Pulse 70   Temp 97.9 F (36.6 C) (Temporal)   Resp  16   Ht 5' 5.5" (1.664 m)   Wt 167 lb 9.6 oz (76 kg)   LMP 12/16/2013 (Exact Date)   BMI 27.47 kg/m     General appearance: alert, cooperative and appears stated age   Pelvic: External genitalia: minory rosy pink color of the labia minor bilaterally.               Urethra:  normal appearing urethra with no masses, tenderness or lesions              Bartholins and Skenes: normal                 Vagina: normal appearing vagina with normal color and white creamy discharge, no lesions              Cervix: no lesions                Bimanual Exam:  Uterus:  normal size, contour, position, consistency, mobility, non-tender              Adnexa: no mass, fullness, tenderness              Chaperone was present for exam.  ASSESSMENT  Vulvovaginitis.  Recurrent. HRT patient.   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  Affirm.  Switch to Estring.  Continue Estrace 2 mg and Prometrium 200 mg daily.  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  FU in Jan. 2021.   An After Visit Summary was printed and given to the patient.  ___15__ minutes face to face time of which over 50% was spent in counseling.

## 2019-04-22 NOTE — Telephone Encounter (Signed)
Medication refill request: Bupropion  Last AEX:  08-28-2018 BS Next AEX: not currently scheduled Last MMG (if hormonal medication request): n/a Refill authorized: Today, please advise.   Medication pended for #90, 0RF. Please refill if appropriate.

## 2019-04-22 NOTE — Telephone Encounter (Signed)
Pleases contact patient in follow up to this request.   Does she need a refill, or did she already stop the Wellbutrin.   If she needs a refill, ok to refill until her annual exam is due.   She may need to change her follow HRT appointment to an annual exam for January, 2021.

## 2019-04-23 ENCOUNTER — Telehealth: Payer: Self-pay | Admitting: Obstetrics and Gynecology

## 2019-04-23 LAB — VAGINITIS/VAGINOSIS, DNA PROBE
Candida Species: NEGATIVE
Gardnerella vaginalis: NEGATIVE
Trichomonas vaginosis: NEGATIVE

## 2019-04-23 NOTE — Telephone Encounter (Signed)
Spoke with Shelly [name]. Advised per Dr. Quincy Simmonds.   Shelly [name] states she started estrace vaginal cream 01/31/18, switched to vagifem 08/2018, not effective. Shelly [name] request to proceed with PA for Estring. Advised Shelly [name] I will submit PA for Estring 2mg  vaginal ring to plan, can take up to 5 business days for response. Our office will notify you of response once received. Shelly [name] agreeable.

## 2019-04-23 NOTE — Telephone Encounter (Signed)
Please contact patient regarding Estring not being a medication covered by insurance.   Vagifem and Premarin vaginal estrogen cream are formulary drugs for her insurance.   It will help me to understand if the Vagifem and vaginal estrogen creams are inadequate for her.  If this is the case, we can ask for a prior authorization for the Estring.

## 2019-04-23 NOTE — Telephone Encounter (Signed)
Patient returned call. Patient states she is no longer taking the Wellbutrin. RN advised would update pharmacy. AEX scheduled for 09-02-19 at 1300. Patient agreeable to date and time of appointment.   Will close encounter.

## 2019-04-23 NOTE — Telephone Encounter (Signed)
Left message to call Donella Pascarella, RN at [facility] [phone].   

## 2019-04-24 NOTE — Telephone Encounter (Signed)
PA for Estring 2mg  Ring submitted to plan via [email or web address]  KeyDebarah Crape - PA Case ID: [id number]

## 2019-04-25 NOTE — Telephone Encounter (Signed)
PA request denied for Estring. Requesting additional information. Patient states she has tried vaginal estrace cream 01/2018 -02/2018, not effective. Vagifem 08/2018 -04/2019, not effective.   Letter with additional information faxed to [place].

## 2019-05-02 NOTE — Telephone Encounter (Signed)
Appeal for Estring denied.   Call to patient to notify. Reviewed option of manufacture savings card, provided instructions to download. Patient is going to try this option, will notify office if alternative Rx is needed. Patient thankful for assistance.   .Routing to provider for final review. Patient is agreeable to disposition. Will close encounter.

## 2019-06-10 ENCOUNTER — Telehealth: Payer: Self-pay | Admitting: Obstetrics and Gynecology

## 2019-06-11 ENCOUNTER — Other Ambulatory Visit: Payer: Self-pay | Admitting: Obstetrics and Gynecology

## 2019-06-11 NOTE — Telephone Encounter (Signed)
Medication refill request: Progesterone Last AEX:  08/28/18 BS Next AEX: 09/02/19 Last MMG (if hormonal medication request): 10/14/18 BIRADS 1 negative/density d Refill authorized: Please advise on refill;

## 2019-07-19 IMAGING — MG DIGITAL SCREENING BILATERAL MAMMOGRAM WITH TOMO AND CAD
8 series · 9 of 24 positions shown · non-contrast
Comparison: Previous exam(s).

CLINICAL DATA: Screening.

EXAM:
DIGITAL SCREENING BILATERAL MAMMOGRAM WITH TOMO AND CAD

[L MLO synth-2D]
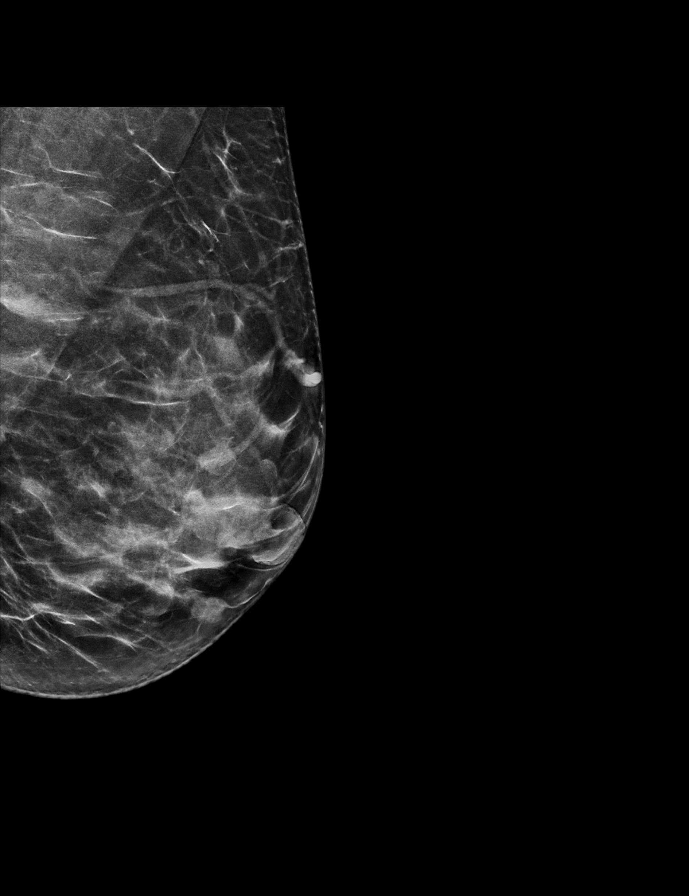

[R MLO synth-2D]
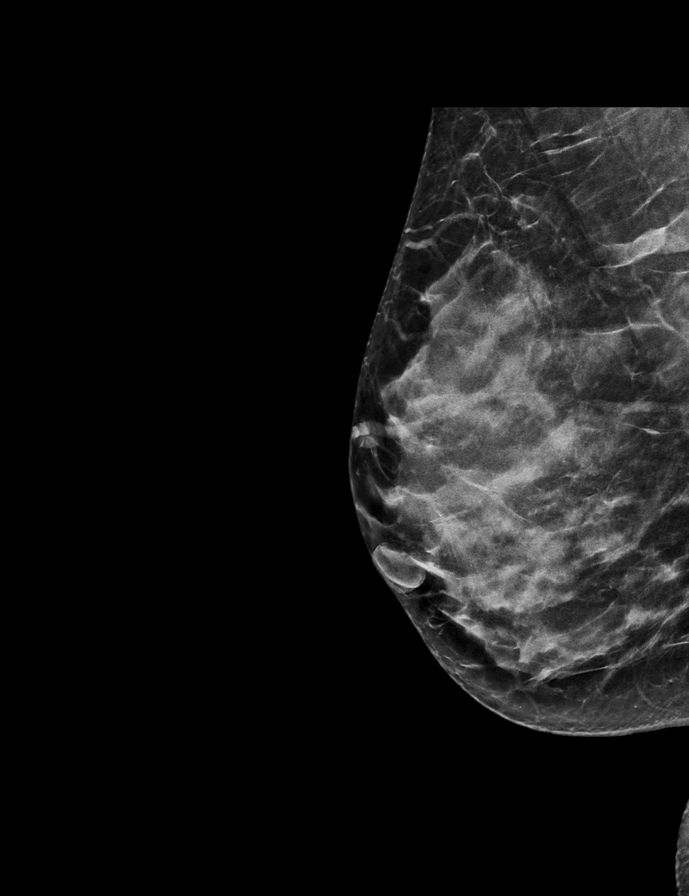

[R CC synth-2D]
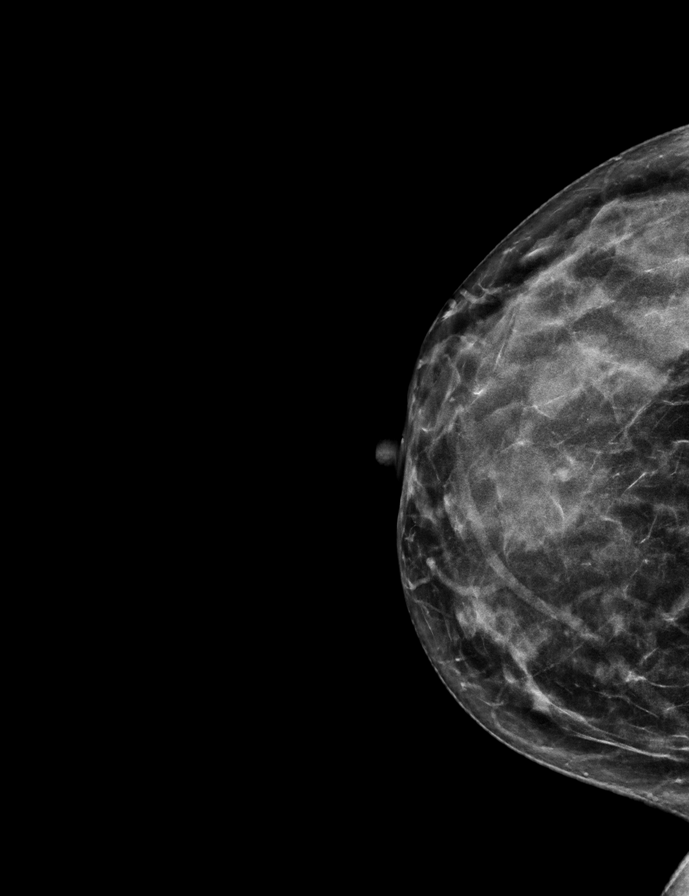

[L CC synth-2D]
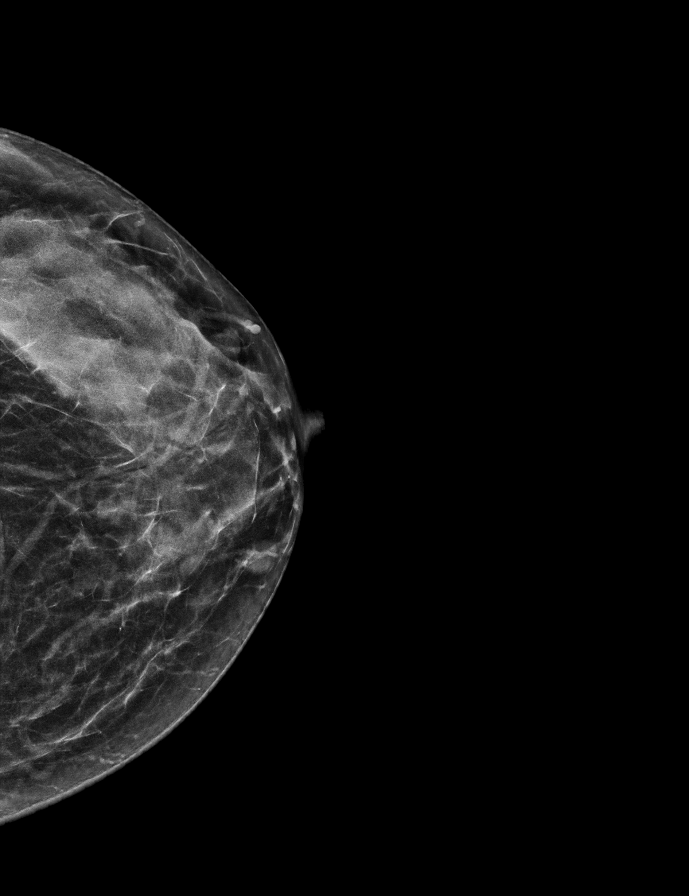

[L MLO tomo · 2 of 55 frames shown]
[frame 18/55]
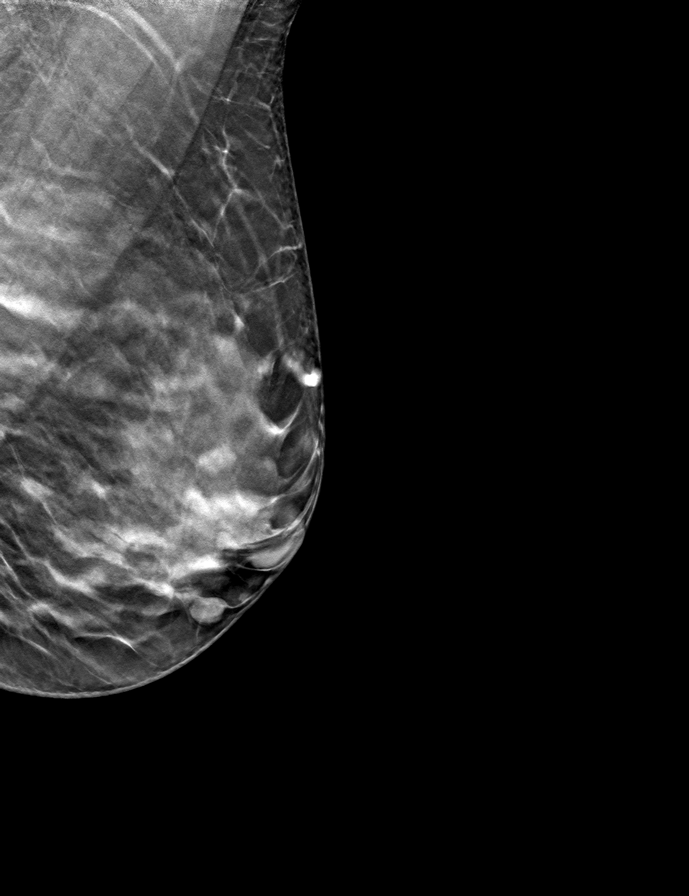
[frame 28/55]
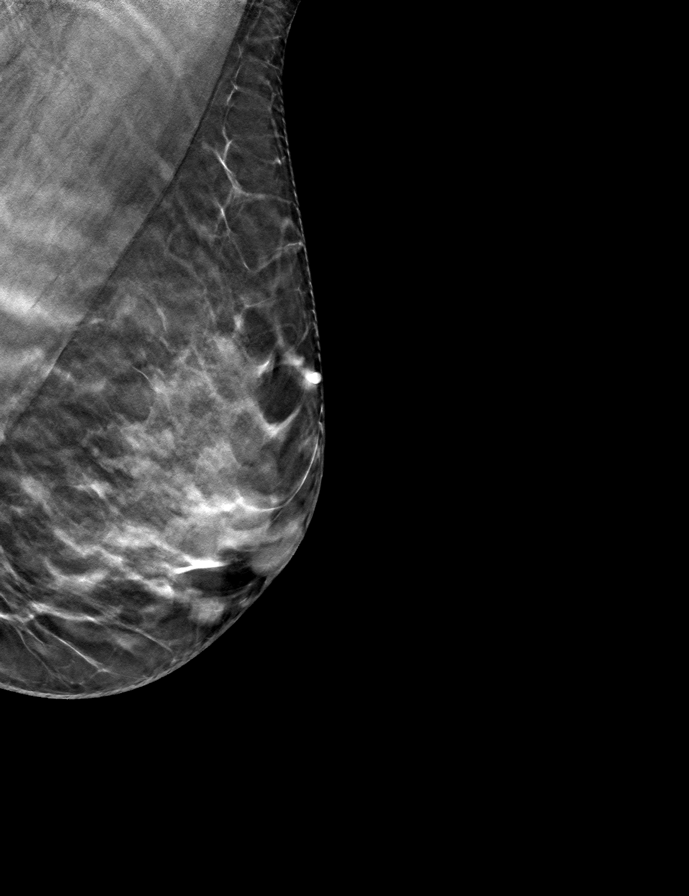

[R MLO tomo · tomo slice 29/57.0]
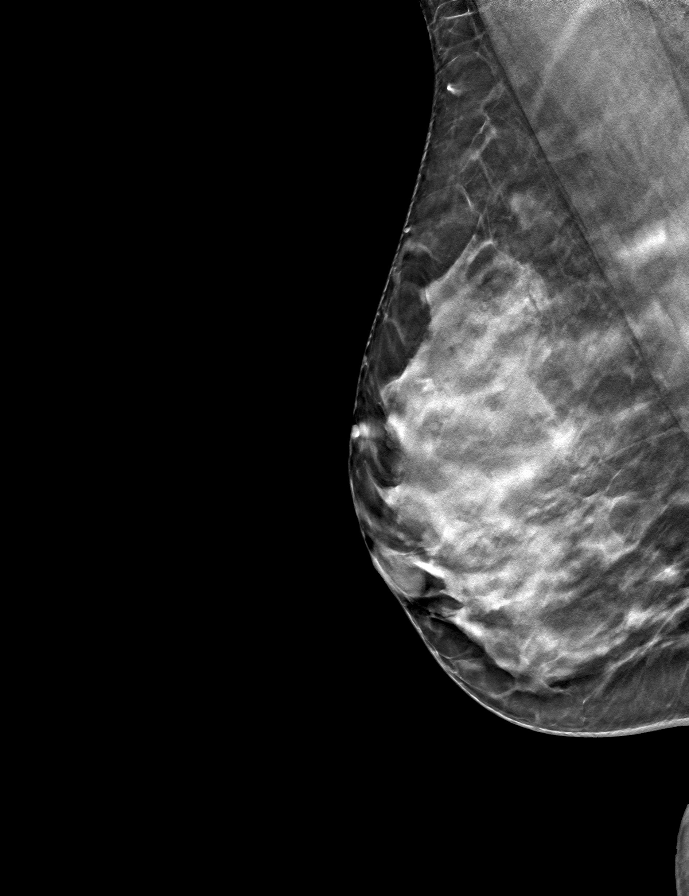

[L CC tomo · tomo slice 26/51.0]
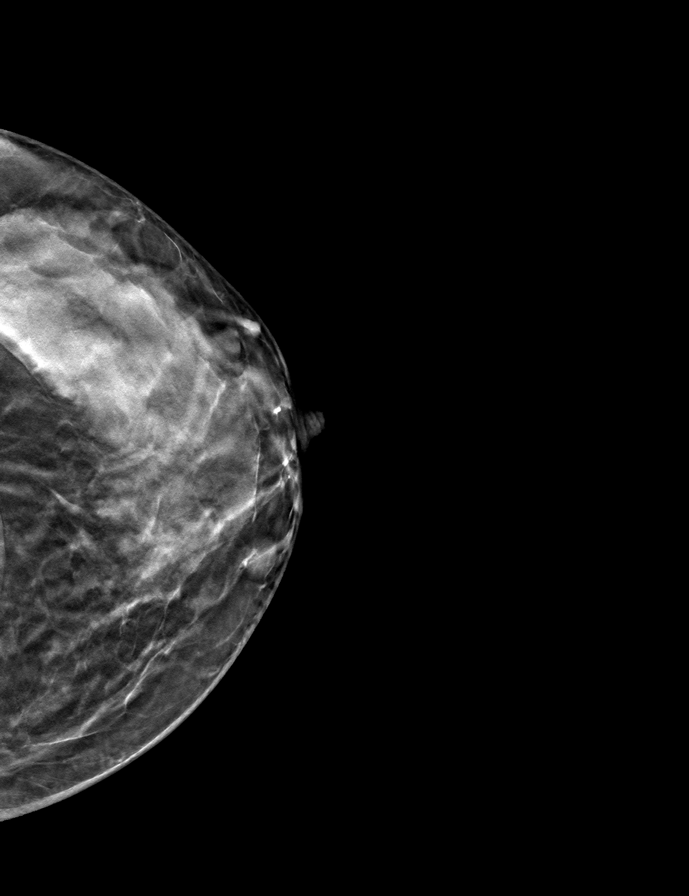

[R CC tomo · tomo slice 27/54.0]
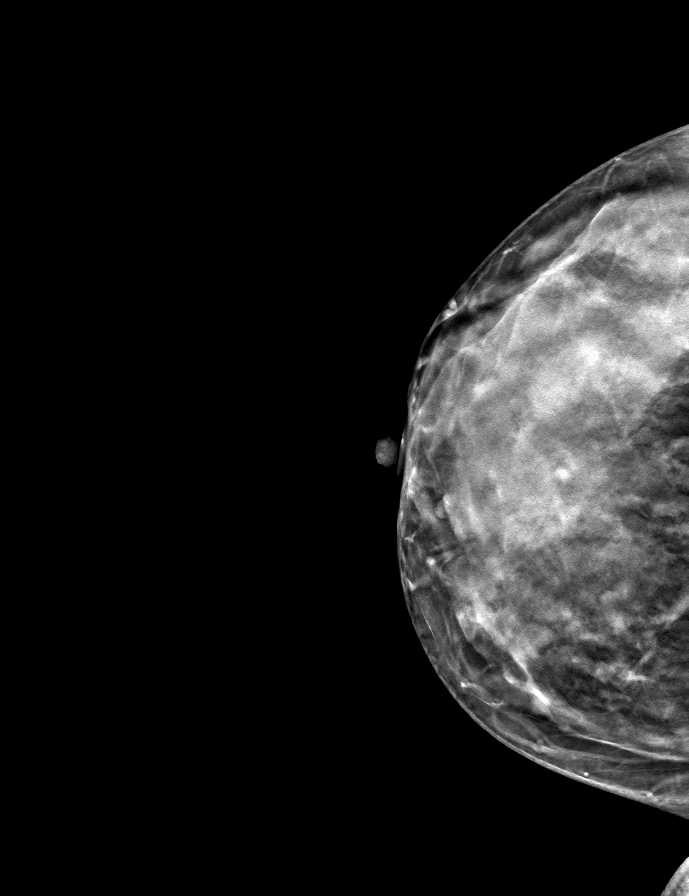

[9 of 24 positions shown; findings below may reference images not displayed]

ACR Breast Density Category d: The breast tissue is extremely dense,
which lowers the sensitivity of mammography
FINDINGS: There are no findings suspicious for malignancy. Images were
processed with CAD.
IMPRESSION: No mammographic evidence of malignancy. A result letter of this
screening mammogram will be mailed directly to the patient.

RECOMMENDATION:
Screening mammogram in one year. (Code:WO-0-ZI0)

BI-RADS CATEGORY  1: Negative.

## 2019-08-15 DIAGNOSIS — R7989 Other specified abnormal findings of blood chemistry: Secondary | ICD-10-CM

## 2019-08-15 HISTORY — DX: Other specified abnormal findings of blood chemistry: R79.89

## 2019-09-01 NOTE — Progress Notes (Deleted)
49 y.o. G18P0 Married Caucasian female here for annual exam.    PCP:     Patient's last menstrual period was 12/16/2013 (exact date).           Sexually active: {yes no:314532}  The current method of family planning is post menopausal status.    Exercising: {yes PF:790240}  {types:19826} Smoker:  Former  Health Maintenance: Pap:  08/2017 at [facility]--normal per patient History of abnormal Pap:  no MMG: 10-14-18 3D/Neg/density D/BiRads1 Colonoscopy:  *** BMD:   ***  Result  *** TDaP: ***08-15-1999 Gardasil:   no HIV:07-03-13 NR Hep C: 07-03-13 Neg Screening Labs:  Hb today: ***, Urine today: ***   reports that she quit smoking about 24 years ago. She has never used smokeless tobacco. She reports current alcohol use. She reports that she does not use drugs.  Past Medical History:  Diagnosis Date  . GERD (gastroesophageal reflux disease)   . Group A streptococcal infection   . Hormone disorder   . Pericardial effusion   . Secondary cardiomyopathy ([place])   . Toxic shock ([place])   . Toxic shock [facility])     Past Surgical History:  Procedure Laterality Date  . ANTERIOR CERVICAL DECOMP/DISCECTOMY FUSION  11/18/2009   C5-7  . CARPAL TUNNEL RELEASE Left   . CESAREAN SECTION  2008   complete placenta pervia, hemmorage  . CHEILECTOMY Left 05/01/2013   Procedure: LEFT GREAT TOE HALLUX RIGIDUS WITH CHEILECTOMY 1ST METATARSOPHALANGEAL;  Surgeon: Ninetta Lights, MD;  Location: [place];  Service: Orthopedics;  Laterality: Left;  . CHEILECTOMY Right 06/02/2013   procedule:right great toe chielectomy  . HYSTEROSCOPY  10/2017   done by Dr. Leo Grosser   . I & D EXTREMITY Right 07/02/2013   Procedure: IRRIGATION AND DEBRIDEMENT RIGHT 1ST Metaphalangeal JOINT;  Surgeon: Ninetta Lights, MD;  Location: [place];  Service: Orthopedics;  Laterality: Right;  . KNEE SURGERY  [phone]   7 surgeries left knee(2 lateral releases, 1 tibial tubercle oxtomy, 1 patellectomy)    Current  Outpatient Medications  Medication Sig Dispense Refill  . buPROPion (WELLBUTRIN XL) 150 MG 24 hr tablet Take 1 tablet (150 mg total) by mouth daily. (Patient taking differently: Take 150 mg by mouth daily. Patient weaning off) 90 tablet 0  . estradiol (ESTRACE) 1 MG tablet TAKE 2 TABLETS BY MOUTH EVERY DAY 180 tablet 0  . estradiol (ESTRING) 2 MG vaginal ring Place 2 mg vaginally every 3 (three) months. Insert a new ring into vagina every 3 months 1 each 1  . Multiple Vitamins-Minerals (MULTIVITAMIN ADULT PO) Take 1 tablet by mouth daily.    . NONFORMULARY OR COMPOUNDED ITEM Testosterone 2% cream   Apply 1 click (0.5 gram) behind knee or inner thigh daily. Dispense 30 grams. 30 each 0  . progesterone (PROMETRIUM) 200 MG capsule TAKE 1 CAPSULE BY MOUTH EVERY DAY 90 capsule 0   No current facility-administered medications for this visit.    Family History  Problem Relation Age of Onset  . Hypertension Father   . Alzheimer's disease Father   . Hypertension Mother   . Hyperlipidemia Mother   . Alzheimer's disease Mother   . Migraines Sister   . Kidney disease Maternal Grandmother   . Heart attack Maternal Grandfather 63    Review of Systems  Exam:   LMP 12/16/2013 (Exact Date)     General appearance: alert, cooperative and appears stated age Head: normocephalic, without obvious abnormality, atraumatic Neck: no adenopathy, supple, symmetrical, trachea  midline and thyroid normal to inspection and palpation Lungs: clear to auscultation bilaterally Breasts: normal appearance, no masses or tenderness, No nipple retraction or dimpling, No nipple discharge or bleeding, No axillary adenopathy Heart: regular rate and rhythm Abdomen: soft, non-tender; no masses, no organomegaly Extremities: extremities normal, atraumatic, no cyanosis or edema Skin: skin color, texture, turgor normal. No rashes or lesions Lymph nodes: cervical, supraclavicular, and axillary nodes normal. Neurologic: grossly  normal  Pelvic: External genitalia:  no lesions              No abnormal inguinal nodes palpated.              Urethra:  normal appearing urethra with no masses, tenderness or lesions              Bartholins and Skenes: normal                 Vagina: normal appearing vagina with normal color and discharge, no lesions              Cervix: no lesions              Pap taken: {yes no:314532} Bimanual Exam:  Uterus:  normal size, contour, position, consistency, mobility, non-tender              Adnexa: no mass, fullness, tenderness              Rectal exam: {yes no:314532}.  Confirms.              Anus:  normal sphincter tone, no lesions  Chaperone was present for exam.  Assessment:   Well woman visit with normal exam.   Plan: Mammogram screening discussed. Self breast awareness reviewed. Pap and HR HPV as above. Guidelines for Calcium, Vitamin D, regular exercise program including cardiovascular and weight bearing exercise.   Follow up annually and prn.   Additional counseling given.  {yes [id number]. _______ minutes face to face time of which over 50% was spent in counseling.    After visit summary provided.

## 2019-09-02 ENCOUNTER — Ambulatory Visit: Payer: BC Managed Care – PPO | Admitting: Obstetrics and Gynecology

## 2019-09-02 ENCOUNTER — Telehealth: Payer: Self-pay | Admitting: Obstetrics and Gynecology

## 2019-09-02 NOTE — Telephone Encounter (Signed)
Patient cancelled/rescheduled this afternoon's appointment due to sore throat. Rescheduled to 09/23/19.

## 2019-09-17 ENCOUNTER — Other Ambulatory Visit: Payer: Self-pay

## 2019-09-17 MED ORDER — PROGESTERONE MICRONIZED 200 MG PO CAPS: ORAL_CAPSULE | ORAL | 0 refills | Status: DC

## 2019-09-17 MED ORDER — ESTRADIOL 1 MG PO TABS: 2.0000 mg | ORAL_TABLET | Freq: Every day | ORAL | 0 refills | Status: DC

## 2019-09-17 NOTE — Telephone Encounter (Signed)
Medication refill request: Progesterone 200 mg cap Last AEX:  09/07/18  Next AEX: 09/23/19 Last MMG (if hormonal medication request): 10/14/18  Birads  1 neg  Refill authorized: #30 with 0 RF

## 2019-09-17 NOTE — Telephone Encounter (Signed)
Medication refill request: Estradiol 1 mg tab  Last AEX:  08/28/18  Next AEX: 09/23/19 Last MMG (if hormonal medication request): 10/14/2018 Bi rads 1 neg  Refill authorized: #30 with 0 RF to get her to her appointment

## 2019-09-23 ENCOUNTER — Ambulatory Visit (INDEPENDENT_AMBULATORY_CARE_PROVIDER_SITE_OTHER): Payer: BC Managed Care – PPO | Admitting: Obstetrics and Gynecology

## 2019-09-23 ENCOUNTER — Other Ambulatory Visit: Payer: Self-pay

## 2019-09-23 ENCOUNTER — Encounter: Payer: Self-pay | Admitting: Obstetrics and Gynecology

## 2019-09-23 VITALS — BP 110/70 | HR 68 | Temp 97.2°F | Resp 14 | Ht 65.0 in | Wt 165.2 lb

## 2019-09-23 DIAGNOSIS — R6882 Decreased libido: Secondary | ICD-10-CM

## 2019-09-23 DIAGNOSIS — M5011 Cervical disc disorder with radiculopathy,  high cervical region: Secondary | ICD-10-CM | POA: Diagnosis not present

## 2019-09-23 DIAGNOSIS — R5383 Other fatigue: Secondary | ICD-10-CM

## 2019-09-23 DIAGNOSIS — Z23 Encounter for immunization: Secondary | ICD-10-CM

## 2019-09-23 DIAGNOSIS — R946 Abnormal results of thyroid function studies: Secondary | ICD-10-CM | POA: Diagnosis not present

## 2019-09-23 DIAGNOSIS — Z01419 Encounter for gynecological examination (general) (routine) without abnormal findings: Secondary | ICD-10-CM

## 2019-09-23 DIAGNOSIS — Z981 Arthrodesis status: Secondary | ICD-10-CM | POA: Diagnosis not present

## 2019-09-23 DIAGNOSIS — M4724 Other spondylosis with radiculopathy, thoracic region: Secondary | ICD-10-CM | POA: Diagnosis not present

## 2019-09-23 DIAGNOSIS — M2578 Osteophyte, vertebrae: Secondary | ICD-10-CM | POA: Diagnosis not present

## 2019-09-23 MED ORDER — ESTRADIOL 1 MG PO TABS: 2.0000 mg | ORAL_TABLET | Freq: Every day | ORAL | 3 refills | Status: DC

## 2019-09-23 MED ORDER — PROGESTERONE MICRONIZED 200 MG PO CAPS: ORAL_CAPSULE | ORAL | 3 refills | Status: DC

## 2019-09-23 MED ORDER — ESTRADIOL 2 MG VA RING: 2.0000 mg | VAGINAL_RING | VAGINAL | 3 refills | Status: DC

## 2019-09-23 NOTE — Progress Notes (Signed)
49 y.o. G68P0 Married Caucasian female here for annual exam.    Patient complaining of vaginal dryness and decreased libido. She uses Vagifem twice a week, and this is not helping.  Vaginal estrogen was not effective in the past as well.  Feels like her mood is good.  She does not feel like she needs the Wellbutrin.  She has some decreased energy.  She is using testosterone cream daily.   Has good exercise tolerance.   PCP:  Merri Brunette, MD   Patient's last menstrual period was 12/16/2013 (exact date).           Sexually active: Yes.    The current method of family planning is post menopausal status.    Exercising: Yes.    weights and cycling--some yoga Smoker:  Former  Health Maintenance: Pap:  08/2017 at [facility]--normal per patient History of abnormal Pap:  no MMG: 10-14-18 3D/Neg/density D/BiRads1 Colonoscopy: N/A BMD: ~2018  Result :Normal per patient at [facility] TDaP: 08-15-1999--patient requests today Gardasil:   no HIV:07-03-13 NR Hep C:07-03-13 Neg Screening Labs:  Today.    reports that she quit smoking about 24 years ago. She has never used smokeless tobacco. She reports current alcohol use. She reports that she does not use drugs.  Past Medical History:  Diagnosis Date  . GERD (gastroesophageal reflux disease)   . Group A streptococcal infection   . Hormone disorder   . Pericardial effusion   . Secondary cardiomyopathy (HCC)   . Toxic shock (HCC)   . Toxic shock [facility])     Past Surgical History:  Procedure Laterality Date  . ANTERIOR CERVICAL DECOMP/DISCECTOMY FUSION  11/18/2009   C5-7  . CARPAL TUNNEL RELEASE Left   . CESAREAN SECTION  2008   complete placenta pervia, hemmorage  . CHEILECTOMY Left 05/01/2013   Procedure: LEFT GREAT TOE HALLUX RIGIDUS WITH CHEILECTOMY 1ST METATARSOPHALANGEAL;  Surgeon: Loreta Ave, MD;  Location: [facility];  Service: Orthopedics;  Laterality: Left;  . CHEILECTOMY Right 06/02/2013    procedule:right great toe chielectomy  . HYSTEROSCOPY  10/2017   done by Dr. Pennie Rushing   . I & D EXTREMITY Right 07/02/2013   Procedure: IRRIGATION AND DEBRIDEMENT RIGHT 1ST Metaphalangeal JOINT;  Surgeon: Loreta Ave, MD;  Location: [facility] OR;  Service: Orthopedics;  Laterality: Right;  . KNEE SURGERY  [phone]   7 surgeries left knee(2 lateral releases, 1 tibial tubercle oxtomy, 1 patellectomy)    Current Outpatient Medications  Medication Sig Dispense Refill  . estradiol (ESTRACE) 1 MG tablet Take 2 tablets (2 mg total) by mouth daily. 60 tablet 0  . Multiple Vitamins-Minerals (MULTIVITAMIN ADULT PO) Take 1 tablet by mouth daily.    . NONFORMULARY OR COMPOUNDED ITEM Testosterone 2% cream   Apply 1 click (0.5 gram) behind knee or inner thigh daily. Dispense 30 grams. 30 each 0  . progesterone (PROMETRIUM) 200 MG capsule TAKE 1 CAPSULE BY MOUTH EVERY DAY 30 capsule 0   No current facility-administered medications for this visit.    Family History  Problem Relation Age of Onset  . Hypertension Father   . Alzheimer's disease Father   . Hypertension Mother   . Hyperlipidemia Mother   . Alzheimer's disease Mother   . Migraines Sister   . Kidney disease Maternal Grandmother   . Heart attack Maternal Grandfather 63    Review of Systems  All other systems reviewed and are negative.   Exam:   BP 110/70   Pulse  68   Temp (!) 97.2 F (36.2 C) (Temporal)   Resp 14   Ht 5\' 5"  (1.651 m)   Wt 165 lb 3.2 oz (74.9 kg)   LMP 12/16/2013 (Exact Date)   BMI 27.49 kg/m     General appearance: alert, cooperative and appears stated age Head: normocephalic, without obvious abnormality, atraumatic Neck: no adenopathy, supple, symmetrical, trachea midline and thyroid normal to inspection and palpation Lungs: clear to auscultation bilaterally Breasts: normal appearance, no masses or tenderness, No nipple retraction or dimpling, No nipple discharge or bleeding, No axillary adenopathy Heart:  regular rate and rhythm Abdomen: soft, non-tender; no masses, no organomegaly Extremities: extremities normal, atraumatic, no cyanosis or edema Skin: skin color, texture, turgor normal. No rashes or lesions Lymph nodes: cervical, supraclavicular, and axillary nodes normal. Neurologic: grossly normal  Pelvic: External genitalia:  no lesions              No abnormal inguinal nodes palpated.              Urethra:  normal appearing urethra with no masses, tenderness or lesions              Bartholins and Skenes: normal                 Vagina: normal appearing vagina with normal color and discharge, no lesions              Cervix: no lesions              Pap taken: No. Bimanual Exam:  Uterus:  normal size, contour, position, consistency, mobility, non-tender              Adnexa: no mass, fullness, tenderness              Rectal exam: Declined.  Chaperone was present for exam.  Assessment:   Well woman visit with normal exam. Premature menopause. HRT with testosterone replacement. Migraine with aura. Does not currently have migraines.  Hypothyroidism. Off Nature thyroid.  Decreased libido.  Fatigue.   Plan: Mammogram screening discussed. Self breast awareness reviewed. Pap and HR HPV as above. Guidelines for Calcium, Vitamin D, regular exercise program including cardiovascular and weight bearing exercise. Tdap.  Routine labs, testosterone, iron, ferritin, TSH, vit D, vit B12.  Refill of her HRT for one year.  Discused WHI and use of HRT which can increase risk of PE, DVT, MI, stroke and breast cancer.  Rx for Estring.  She will look into options for pharmacies for this Rx.  We talked about [facility].  Follow up annually and prn.   After visit summary provided.

## 2019-09-23 NOTE — Patient Instructions (Signed)

## 2019-09-24 ENCOUNTER — Telehealth: Payer: Self-pay | Admitting: Obstetrics and Gynecology

## 2019-09-24 ENCOUNTER — Encounter: Payer: Self-pay | Admitting: Obstetrics and Gynecology

## 2019-09-24 DIAGNOSIS — R7989 Other specified abnormal findings of blood chemistry: Secondary | ICD-10-CM

## 2019-09-24 DIAGNOSIS — E0590 Thyrotoxicosis, unspecified without thyrotoxic crisis or storm: Secondary | ICD-10-CM

## 2019-09-24 NOTE — Telephone Encounter (Signed)
Please have the patient give [place] more information about the process so we can help her best.  I am not able to receive emails from patients.   Cc- Billie Ruddy

## 2019-09-24 NOTE — Telephone Encounter (Signed)
Hi Dr. Edward Jolly,  After doing some research I discovered that I might actually be eligible to have the prescription for Estring covered by [employer]. They need your email so that I can forward a request for you to fill out part of the form. Would you be willing to do that? If so, could you provide me with your email address?  Thank you!  Roanne

## 2019-09-24 NOTE — Telephone Encounter (Signed)
[name], Shelly "Oz-La"  You 15 minutes ago (4:12 PM)   I cannot send the form. It is not something to be attached. I have to send Dr. Edward Jolly an email invitation for it to be filled out online.   Routing to Dr. Edward Jolly to review request and advise.

## 2019-09-24 NOTE — Telephone Encounter (Signed)
MyChart message to patient.  

## 2019-09-25 NOTE — Telephone Encounter (Signed)
Call to patient, no answer. Unable to leave voicemail.

## 2019-09-25 NOTE — Telephone Encounter (Signed)
MyChart message to patient.  

## 2019-09-27 LAB — TESTOSTERONE, FREE, DIRECT
Testosterone, Free: 2.7 pg/mL (ref 0.0–4.2)
Testosterone, Total, LC/MS: 167.8 ng/dL

## 2019-09-27 LAB — COMPREHENSIVE METABOLIC PANEL
ALT: 17 IU/L (ref 0–32)
AST: 21 IU/L (ref 0–40)
Albumin/Globulin Ratio: 1.8 (ref 1.2–2.2)
Albumin: 4.2 g/dL (ref 3.8–4.8)
Alkaline Phosphatase: 59 IU/L (ref 39–117)
BUN/Creatinine Ratio: 22 (ref 9–23)
BUN: 20 mg/dL (ref 6–24)
Bilirubin Total: 0.6 mg/dL (ref 0.0–1.2)
CO2: 22 mmol/L (ref 20–29)
Calcium: 9.6 mg/dL (ref 8.7–10.2)
Chloride: 101 mmol/L (ref 96–106)
Creatinine, Ser: 0.91 mg/dL (ref 0.57–1.00)
GFR calc Af Amer: 87 mL/min/{1.73_m2} (ref >59)
GFR calc non Af Amer: 75 mL/min/{1.73_m2} (ref >59)
Globulin, Total: 2.3 g/dL (ref 1.5–4.5)
Glucose: 87 mg/dL (ref 65–99)
Potassium: 4.3 mmol/L (ref 3.5–5.2)
Sodium: 139 mmol/L (ref 134–144)
Total Protein: 6.5 g/dL (ref 6.0–8.5)

## 2019-09-27 LAB — LIPID PANEL
Chol/HDL Ratio: 2.8 ratio (ref 0.0–4.4)
Cholesterol, Total: 195 mg/dL (ref 100–199)
HDL: 69 mg/dL (ref >39)
LDL Chol Calc (NIH): 109 mg/dL — ABNORMAL HIGH (ref 0–99)
Triglycerides: 95 mg/dL (ref 0–149)
VLDL Cholesterol Cal: 17 mg/dL (ref 5–40)

## 2019-09-27 LAB — CBC
Hematocrit: 41.5 % (ref 34.0–46.6)
Hemoglobin: 14 g/dL (ref 11.1–15.9)
MCH: 29.2 pg (ref 26.6–33.0)
MCHC: 33.7 g/dL (ref 31.5–35.7)
MCV: 87 fL (ref 79–97)
Platelets: 314 10*3/uL (ref 150–450)
RBC: 4.8 x10E6/uL (ref 3.77–5.28)
RDW: 11.9 % (ref 11.7–15.4)
WBC: 7.1 10*3/uL (ref 3.4–10.8)

## 2019-09-27 LAB — VITAMIN D 25 HYDROXY (VIT D DEFICIENCY, FRACTURES): Vit D, 25-Hydroxy: 33.7 ng/mL (ref 30.0–100.0)

## 2019-09-27 LAB — IRON: Iron: 121 ug/dL (ref 27–159)

## 2019-09-27 LAB — FERRITIN: Ferritin: 67 ng/mL (ref 15–150)

## 2019-09-27 LAB — VITAMIN B12: Vitamin B-12: 1048 pg/mL (ref 232–1245)

## 2019-09-27 LAB — TSH: TSH: <0.005 u[IU]/mL — ABNORMAL LOW (ref 0.450–4.500)

## 2019-09-29 ENCOUNTER — Encounter: Payer: Self-pay | Admitting: Obstetrics and Gynecology

## 2019-09-29 LAB — T3, FREE: T3, Free: 5.8 pg/mL — ABNORMAL HIGH (ref 2.0–4.4)

## 2019-09-29 LAB — SPECIMEN STATUS REPORT

## 2019-09-29 LAB — T4, FREE: Free T4: 1.72 ng/dL (ref 0.82–1.77)

## 2019-09-29 NOTE — Telephone Encounter (Signed)
Patient returned call. She won't be available until 3:30 but it is ok to leave a detailed message if need to.

## 2019-09-29 NOTE — Telephone Encounter (Signed)
-----   Message from Patton Salles, MD sent at 09/29/2019  9:50 AM EST ----- Please contact patient with results.   Her thyroid testing indicates possible overactive thyroid.  I am recommending she see endocrinology.   She may want to consider someone in the [facility] system to facilitate seeing her test results in Epic, but she can indicate if she has another preference.   Her total testosterone level is quite elevated, but the free testosterone level is normal.  I am recommending she discontinue her testosterone replacement at this time.  We may need to approach her care differently.   Her LDL cholesterol is minimally elevated at 109.   Her blood chemistries, blood counts, iron, ferritin, vitamin B12 and vitamin D are all normal.

## 2019-09-29 NOTE — Telephone Encounter (Signed)
Spoke with patient.   1. Advised of all results as seen below per Dr. Edward Jolly, patient request referral to [facility]. Order placed. Advised patient their office will call her directly to schedule appt.   2. Provided update to patient regarding Pfizer PAP (Patient Assistance Program), will f/u with additional information once received from [employer]. Patient agreeable.

## 2019-09-29 NOTE — Telephone Encounter (Signed)
Leda Min, RN  09/29/2019 2:23 PM EST    Left message to call Noreene Larsson, RN at [facility] [phone].

## 2019-10-01 DIAGNOSIS — M898X1 Other specified disorders of bone, shoulder: Secondary | ICD-10-CM | POA: Diagnosis not present

## 2019-10-01 DIAGNOSIS — M5412 Radiculopathy, cervical region: Secondary | ICD-10-CM | POA: Diagnosis not present

## 2019-10-01 DIAGNOSIS — M5414 Radiculopathy, thoracic region: Secondary | ICD-10-CM | POA: Diagnosis not present

## 2019-10-01 DIAGNOSIS — G5622 Lesion of ulnar nerve, left upper limb: Secondary | ICD-10-CM | POA: Diagnosis not present

## 2019-10-06 NOTE — Telephone Encounter (Signed)
Left message to call Noreene Larsson, RN at [facility] [phone].   Need to confirm additional information for Pfizer PAP

## 2019-10-06 NOTE — Telephone Encounter (Signed)
Spoke with patient, additional information for Pfizer PAP confirmed.

## 2019-10-08 NOTE — Telephone Encounter (Signed)
Spoke with patient. Advised unable to complete pfizer PAP application. Patient will Nature conservation officer for further assistance and f/u with office.

## 2019-10-09 NOTE — Telephone Encounter (Signed)
Reviewed with Dr. Edward Jolly. Call to patient. Advised patient she will need to submit financial documentation required by Pfizer directly to Pfizer PAP to complete her application process.  Also reviewed with patient option to shop pharmacies to compare cost, manufacture savings coupon and [facility]. Patient states she has looked into all of the options discussed and medication is still expensive. Advised patient to return call to office if any additional assistance is needed. Patient verbalizes understanding.   Routing to provider for final review. Patient is agreeable to disposition. Will close encounter.

## 2019-10-17 DIAGNOSIS — N951 Menopausal and female climacteric states: Secondary | ICD-10-CM | POA: Diagnosis not present

## 2019-10-17 DIAGNOSIS — R5383 Other fatigue: Secondary | ICD-10-CM | POA: Diagnosis not present

## 2019-10-17 DIAGNOSIS — E039 Hypothyroidism, unspecified: Secondary | ICD-10-CM | POA: Diagnosis not present

## 2019-10-17 DIAGNOSIS — B2790 Infectious mononucleosis, unspecified without complication: Secondary | ICD-10-CM | POA: Diagnosis not present

## 2019-10-31 ENCOUNTER — Encounter: Payer: Self-pay | Admitting: Certified Nurse Midwife

## 2019-11-02 ENCOUNTER — Encounter: Payer: Self-pay | Admitting: Obstetrics and Gynecology

## 2019-11-03 ENCOUNTER — Telehealth: Payer: Self-pay | Admitting: Obstetrics and Gynecology

## 2019-11-03 NOTE — Telephone Encounter (Signed)
Patient sent the following correspondence through MyChart.  Hi, So I finally found a [place] pharmacy for the estring. They would like the prescription faxed by you. Is that possible?  Thank you, [place]

## 2019-11-03 NOTE — Telephone Encounter (Signed)
Left message for pt to call back to triage RN.  

## 2019-11-11 NOTE — Telephone Encounter (Signed)
Attempted to call pt, voicemail box full. Will sent Mychart message for pt to call back to triage RN.

## 2019-11-17 ENCOUNTER — Encounter: Payer: BC Managed Care – PPO | Admitting: Neurology

## 2019-11-18 DIAGNOSIS — R6882 Decreased libido: Secondary | ICD-10-CM | POA: Diagnosis not present

## 2019-11-18 DIAGNOSIS — R5383 Other fatigue: Secondary | ICD-10-CM | POA: Diagnosis not present

## 2019-11-18 DIAGNOSIS — E039 Hypothyroidism, unspecified: Secondary | ICD-10-CM | POA: Diagnosis not present

## 2019-11-18 DIAGNOSIS — N951 Menopausal and female climacteric states: Secondary | ICD-10-CM | POA: Diagnosis not present

## 2019-11-19 ENCOUNTER — Encounter: Payer: BC Managed Care – PPO | Admitting: Neurology

## 2019-11-25 NOTE — Telephone Encounter (Signed)
Spoke with pt. AEX 09/23/19 with BS. Premature menopause. HRT with testosterone replacement. Migraine with aura. Does not currently have migraines.  Hypothyroidism. OffNature thyroid.  Decreased libido.  Fatigue.    Pt states calling to give alt pharmacy for new Estring ring Rx. Pt to send pharmacy info via Mychart, then review with Dr Oscar La in absence of Dr Edward Jolly. Pt agreeable.   Per AEX visit note: Rx for Estring.  She will look into options for pharmacies for this Rx.   Last MMG 10/14/2018 Birads 1, Negative. Category D density.   Routing to Dr Oscar La, covering provider for Dr Edward Jolly.

## 2020-01-26 ENCOUNTER — Telehealth: Payer: Self-pay

## 2020-01-26 NOTE — Telephone Encounter (Signed)
Patient calling in regards to infection.

## 2020-01-26 NOTE — Telephone Encounter (Signed)
AEX 09/23/19 H/o vaginal dryness   Spoke with pt. Pt reports having her vagina "feeling raw" for last 4-5 days. Denies vaginal discharge, odor, bleeding or spotting. Pt states after intercourse, feeling "stings" Pt states has not started Estring Rx as prescribed in 09/2019 due to waiting to obtain Rx from pharmacy in [place]. Pt states has been a delay on Rx from pharmacy and wont receive until 4-6 weeks from now. Pt states still using oral Estrace and Prometrium as prescribed. Pt wanting to know if needs to get vaginal tablets?  Advised to have OV for further evaluation due to vaginal sx at this time. Pt agreeable. Pt scheduled with Dr Edward Jolly on 6/16 at 1:30 pm. Pt agreeable and verbalized understanding of date and time of appt.CPS neg.   Routing to Dr Edward Jolly for review.  Encounter closed.

## 2020-01-27 NOTE — Progress Notes (Signed)
GYNECOLOGY  VISIT   HPI: 49 y.o.   Married  Caucasian  female   G28P0 with Patient's last menstrual period was 12/16/2013 (exact date).   here for vaginal dryness, raw feeling. She hasn't started on Estring yet because she hasn't received from pharmacy from [place].  Intercourse makes her symptoms increase afterward.   Small amount of discharge.   Symptoms come and go. No continuous burning.   Vagifem burns with use.  She is waiting to get her Estring Rx.  On HRT orally.   Patient also states having some urinary frequency. Voiding does not relieve discomfort.   Husband just diagnosed with kidney cancer.   Urine Dip:  Trace WBCs.  GYNECOLOGIC HISTORY: Patient's last menstrual period was 12/16/2013 (exact date). Contraception:  PMP Menopausal hormone therapy: Estradiol 1mg  and Progesterone 100mg . Last mammogram: 10-14-18 3D/Neg/density D/BiRads1 Last pap smear: 08/2017 at [facility]--normal per patient        OB History    Gravida  4   Para      Term      Preterm      AB      Living  4     SAB      TAB      Ectopic      Multiple      Live Births                 Patient Active Problem List   Diagnosis Date Noted  . Recurrent infections 11/03/2013  . Lung mass 07/31/2013  . Toxic shock ([place]) 07/17/2013  . Group A streptococcal infection   . Pericardial effusion   . SIRS (systemic inflammatory response syndrome) ([place]) 07/01/2013  . Right foot infection 07/01/2013  . Hypotension 07/01/2013  . Tachycardia 07/01/2013  . Septic shock ([place]) 07/01/2013  . Tinea versicolor 11/07/2010  . Constipation 11/07/2010  . Radiculopathy of cervical spine 11/07/2010  . OTHER MALAISE AND FATIGUE 09/01/2010  . MITRAL VALVE PROLAPSE 10/07/2009  . MIGRAINE WITH AURA 02/20/2008  . GERD 02/20/2008    Past Medical History:  Diagnosis Date  . GERD (gastroesophageal reflux disease)   . Group A streptococcal infection   . Hormone disorder   . Low TSH level 2021  .  Pericardial effusion   . Secondary cardiomyopathy ([place])   . Toxic shock ([place])   . Toxic shock [facility])     Past Surgical History:  Procedure Laterality Date  . ANTERIOR CERVICAL DECOMP/DISCECTOMY FUSION  11/18/2009   C5-7  . CARPAL TUNNEL RELEASE Left   . CESAREAN SECTION  2008   complete placenta pervia, hemmorage  . CHEILECTOMY Left 05/01/2013   Procedure: LEFT GREAT TOE HALLUX RIGIDUS WITH CHEILECTOMY 1ST METATARSOPHALANGEAL;  Surgeon: Ninetta Lights, MD;  Location: [place];  Service: Orthopedics;  Laterality: Left;  . CHEILECTOMY Right 06/02/2013   procedule:right great toe chielectomy  . HYSTEROSCOPY  10/2017   done by Dr. Leo Grosser   . I & D EXTREMITY Right 07/02/2013   Procedure: IRRIGATION AND DEBRIDEMENT RIGHT 1ST Metaphalangeal JOINT;  Surgeon: Ninetta Lights, MD;  Location: [place];  Service: Orthopedics;  Laterality: Right;  . KNEE SURGERY  [phone]   7 surgeries left knee(2 lateral releases, 1 tibial tubercle oxtomy, 1 patellectomy)    Current Outpatient Medications  Medication Sig Dispense Refill  . estradiol (ESTRACE) 1 MG tablet Take 2 tablets (2 mg total) by mouth daily. 180 tablet 3  . Multiple Vitamins-Minerals (MULTIVITAMIN ADULT PO) Take 1 tablet by  mouth daily.    . progesterone (PROMETRIUM) 100 MG capsule progesterone micronized 100 mg capsule  TAKE 2 CAPSULES BY MOUTH AT BEDTIME    . estradiol (ESTRING) 2 MG vaginal ring Place 2 mg vaginally every 3 (three) months. Insert a new ring into vagina every 3 months (Patient not taking: Reported on 01/28/2020) 1 each 3   No current facility-administered medications for this visit.     ALLERGIES: Dilaudid [hydromorphone hcl] and Codeine  Family History  Problem Relation Age of Onset  . Hypertension Father   . Alzheimer's disease Father   . Hypertension Mother   . Hyperlipidemia Mother   . Alzheimer's disease Mother   . Migraines Sister   . Kidney disease Maternal Grandmother   . Heart attack  Maternal Grandfather 45    Social History   Socioeconomic History  . Marital status: Married    Spouse name: Not on file  . Number of children: 4  . Years of education: Not on file  . Highest education level: Not on file  Occupational History  . Occupation: stay at home mom    Employer: UNEMPLOYED  Tobacco Use  . Smoking status: Former Smoker    Quit date: 11/03/1994    Years since quitting: 25.2  . Smokeless tobacco: Never Used  Vaping Use  . Vaping Use: Never used  Substance and Sexual Activity  . Alcohol use: Yes    Comment: rarely  . Drug use: No  . Sexual activity: Yes    Birth control/protection: Post-menopausal  Other Topics Concern  . Not on file  Social History Narrative   Exercise 2 times weekly      Diet : Fruit, veggies, water   Social Determinants of Health   Financial Resource Strain:   . Difficulty of Paying Living Expenses:   Food Insecurity:   . Worried About Programme researcher, broadcasting/film/video in the Last Year:   . Barista in the Last Year:   Transportation Needs:   . Freight forwarder (Medical):   Marland Kitchen Lack of Transportation (Non-Medical):   Physical Activity:   . Days of Exercise per Week:   . Minutes of Exercise per Session:   Stress:   . Feeling of Stress :   Social Connections:   . Frequency of Communication with Friends and Family:   . Frequency of Social Gatherings with Friends and Family:   . Attends Religious Services:   . Active Member of Clubs or Organizations:   . Attends Banker Meetings:   Marland Kitchen Marital Status:   Intimate Partner Violence:   . Fear of Current or Ex-Partner:   . Emotionally Abused:   Marland Kitchen Physically Abused:   . Sexually Abused:     Review of Systems  Genitourinary: Positive for frequency.  All other systems reviewed and are negative.   PHYSICAL EXAMINATION:    BP 120/74   Pulse 76   Temp (!) 97.2 F (36.2 C) (Temporal)   Ht 5' 5.5" (1.664 m)   Wt 160 lb 12.8 oz (72.9 kg)   LMP 12/16/2013 (Exact  Date)   BMI 26.35 kg/m     General appearance: alert, cooperative and appears stated age   Pelvic: External genitalia:  no lesions              Urethra:  normal appearing urethra with no masses, tenderness or lesions              Bartholins and Skenes: normal  Vagina: normal appearing vagina with normal color and yellow thin discharge, no lesions              Cervix: no lesions                Bimanual Exam:  Uterus:  normal size, contour, position, consistency, mobility, non-tender              Adnexa: no mass, fullness, tenderness           Chaperone was present for exam.  ASSESSMENT  Dyspareunia. Vaginitis.  Chronic. Abnormal urine.  Urinary frequency.  HRT patient.  PLAN  Affirm.  Urine micro and culture.  Treatment to follow.  Rx for Premarin vaginal cream to use while she is pursuing the Estring.  Return for pelvic ultrasound.  She will update her mammogram.    An After Visit Summary was printed and given to the patient.  __29____ minutes face to face time of which over 50% was spent in counseling.

## 2020-01-28 ENCOUNTER — Other Ambulatory Visit: Payer: Self-pay

## 2020-01-28 ENCOUNTER — Encounter: Payer: Self-pay | Admitting: Obstetrics and Gynecology

## 2020-01-28 ENCOUNTER — Ambulatory Visit: Payer: BC Managed Care – PPO | Admitting: Obstetrics and Gynecology

## 2020-01-28 VITALS — BP 120/74 | HR 76 | Temp 97.2°F | Ht 65.5 in | Wt 160.8 lb

## 2020-01-28 DIAGNOSIS — R350 Frequency of micturition: Secondary | ICD-10-CM

## 2020-01-28 DIAGNOSIS — N941 Unspecified dyspareunia: Secondary | ICD-10-CM | POA: Diagnosis not present

## 2020-01-28 DIAGNOSIS — N761 Subacute and chronic vaginitis: Secondary | ICD-10-CM

## 2020-01-28 DIAGNOSIS — R8290 Unspecified abnormal findings in urine: Secondary | ICD-10-CM | POA: Diagnosis not present

## 2020-01-28 LAB — POCT URINALYSIS DIPSTICK
Bilirubin, UA: NEGATIVE
Blood, UA: NEGATIVE
Glucose, UA: NEGATIVE
Ketones, UA: NEGATIVE
Nitrite, UA: NEGATIVE
Protein, UA: NEGATIVE
Urobilinogen, UA: 0.2 E.U./dL
pH, UA: 5 (ref 5.0–8.0)

## 2020-01-28 MED ORDER — PREMARIN 0.625 MG/GM VA CREA: TOPICAL_CREAM | VAGINAL | 0 refills | Status: DC

## 2020-01-29 ENCOUNTER — Telehealth: Payer: Self-pay | Admitting: Obstetrics and Gynecology

## 2020-01-29 LAB — VAGINITIS/VAGINOSIS, DNA PROBE
Candida Species: NEGATIVE
Gardnerella vaginalis: NEGATIVE
Trichomonas vaginosis: NEGATIVE

## 2020-01-29 LAB — URINALYSIS, MICROSCOPIC ONLY
Bacteria, UA: NONE SEEN
Casts: NONE SEEN /lpf
WBC, UA: NONE SEEN /hpf (ref 0–5)

## 2020-01-29 NOTE — Telephone Encounter (Signed)
Call to patient. Per DPR, OK to leave message on voicemail.   Left voicemail requesting a return call to [name] to review benefits and schedule recommended Pelvic ultrasound with Brook A. Silva, MD, FACOG 

## 2020-01-30 LAB — URINE CULTURE

## 2020-02-02 ENCOUNTER — Telehealth: Payer: Self-pay | Admitting: *Deleted

## 2020-02-02 NOTE — Telephone Encounter (Signed)
Call to patient. Per DPR, OK to leave message on voicemail.   Left voicemail requesting a return call to [name] to review benefits and schedule recommended Pelvic ultrasound with Brook A. Silva, MD, FACOG 

## 2020-02-02 NOTE — Telephone Encounter (Signed)
Incoming fax request received from [facility] in regards to premarin cream stating, "must use estradiol vaginal cream, Imvex XY, vagifem, med necessity exception only."   Routing to provider to review and advise.

## 2020-02-03 ENCOUNTER — Other Ambulatory Visit: Payer: Self-pay | Admitting: Obstetrics and Gynecology

## 2020-02-03 DIAGNOSIS — Z1231 Encounter for screening mammogram for malignant neoplasm of breast: Secondary | ICD-10-CM

## 2020-02-03 NOTE — Telephone Encounter (Signed)
Left message for pt to return call to triage RN. 

## 2020-02-03 NOTE — Telephone Encounter (Signed)
Please contact patient to determine what she would like to do.  We talked about using a coupon from Premarin so she could get a 30 gram tube for no more than $15 if the coupons are still available.  She is currently working to get Estring from a [employer].

## 2020-02-04 MED ORDER — ESTRADIOL 0.1 MG/GM VA CREA
TOPICAL_CREAM | VAGINAL | 0 refills | Status: DC
Start: 2020-02-04 — End: 2020-11-17

## 2020-02-04 NOTE — Telephone Encounter (Signed)
Ok to discontinue Premarin cream and start vaginal estradiol cream 1/2 gram pv at hs for 2 weeks and then 1/2 gram pv at hs twice weekly.  Disp:  42 gram tube, RF  none.

## 2020-02-04 NOTE — Telephone Encounter (Signed)
Spoke with pt. Pt given update on Premarin vaginal cream Rx. Pt states asked [facility] and states cannot use premarin coupon since insurance wont cover medication. Pt requesting to use Estrace vaginal cream as before while waiting on Estring in the mail pharmaco from [place] in 3-4 weeks. Declines using Good Rx coupon.   Advised will review with Dr Edward Jolly and return call to pt. Ok to leave detailed message if no answer for Rx. Pharmacy verified.   Routing to Dr Edward Jolly.  Rx Estrace vaginal cream 0.1mg /gm # 1 (42.5g) tube , 0RF pended if approved.

## 2020-02-04 NOTE — Telephone Encounter (Signed)
Call returned to patient, left detailed message, ok per dpr. Advised of RX and instruction per Dr. Edward Jolly. [facility] [address] Return call to office if any additional questions.    Encounter closed.

## 2020-02-05 NOTE — Telephone Encounter (Signed)
Call to patient. Per DPR, OK to leave message on voicemail.  Left voicemail requesting a return call to [facility] to review benefits and schedule recommended Pelvic ultrasound with Brook A. Edward Jolly, MD, FACOG  Dr. Edward Jolly, please advise as I have attempted to contact patient three times.

## 2020-02-06 NOTE — Telephone Encounter (Signed)
Following MyChart message sent to patient:  Good morning Shelly [name],  I am reaching out to you in regards to scheduling a pelvic ultrasound with Dr. Edward Jolly. I have attempted to contact you via the phone number we have on file, but have not been successful in reaching you. Please reach out to our office at [phone] to review benefits and schedule your appointment.  Thank you so much, [name]

## 2020-02-06 NOTE — Telephone Encounter (Signed)
Ok to send her a My Chart message.

## 2020-02-06 NOTE — Telephone Encounter (Signed)
Patient sent the following message through [facility], I am in continuing education training all week and not able to talk on the phone during that time. I do not think I will have the ultrasound done as the issue appears to be resolved and this test was really just precautionary. Thank you! Shelly [name]  Dr. Edward Jolly, please advise.

## 2020-02-06 NOTE — Telephone Encounter (Signed)
Ok to cancel pelvic ultrasound order and encounter.

## 2020-02-06 NOTE — Telephone Encounter (Signed)
MyChart message returned to patient  Thank you Shelly [name] for getting back to me so quickly. I am glad to hear that the issue you were having seems to be resolved. I will cancel this ultrasound order, but please reach back out to our office if your symptoms return or if any new symptoms arise.  Have a good day and a great weekend,  [name]  Encounter closed.

## 2020-02-10 ENCOUNTER — Ambulatory Visit
Admission: RE | Admit: 2020-02-10 | Discharge: 2020-02-10 | Disposition: A | Discharge status: Home / Self Care | Payer: BC Managed Care – PPO | Source: Ambulatory Visit

## 2020-02-10 ENCOUNTER — Other Ambulatory Visit: Payer: Self-pay

## 2020-02-10 DIAGNOSIS — Z1231 Encounter for screening mammogram for malignant neoplasm of breast: Secondary | ICD-10-CM

## 2020-06-30 DIAGNOSIS — R6882 Decreased libido: Secondary | ICD-10-CM | POA: Diagnosis not present

## 2020-06-30 DIAGNOSIS — N951 Menopausal and female climacteric states: Secondary | ICD-10-CM | POA: Diagnosis not present

## 2020-06-30 DIAGNOSIS — R5383 Other fatigue: Secondary | ICD-10-CM | POA: Diagnosis not present

## 2020-06-30 DIAGNOSIS — E039 Hypothyroidism, unspecified: Secondary | ICD-10-CM | POA: Diagnosis not present

## 2020-07-07 DIAGNOSIS — N951 Menopausal and female climacteric states: Secondary | ICD-10-CM | POA: Diagnosis not present

## 2020-07-07 DIAGNOSIS — R6882 Decreased libido: Secondary | ICD-10-CM | POA: Diagnosis not present

## 2020-07-07 DIAGNOSIS — E039 Hypothyroidism, unspecified: Secondary | ICD-10-CM | POA: Diagnosis not present

## 2020-07-07 DIAGNOSIS — R5383 Other fatigue: Secondary | ICD-10-CM | POA: Diagnosis not present

## 2020-10-07 ENCOUNTER — Other Ambulatory Visit: Payer: Self-pay | Admitting: Obstetrics and Gynecology

## 2020-10-07 NOTE — Telephone Encounter (Signed)
Medication refill request: Estradiol  Last AEX:  09-23-19 BS Next AEX: voicemail full  Last MMG (if hormonal medication request): 02-10-20 density C/BIRADS 1 negative  Refill authorized: Today, please advise.   Medication refill request: Progesterone Refill authorized: Today, please advise.    Attempted to reach patient to schedule aex. Voicemail full, unable to leave a message at this time.   Medications pended for #30, 0RF. Please refill if appropriate. Routing to covering provider.

## 2020-10-20 ENCOUNTER — Other Ambulatory Visit: Payer: Self-pay | Admitting: Obstetrics and Gynecology

## 2020-10-28 ENCOUNTER — Encounter: Payer: Self-pay | Admitting: Obstetrics and Gynecology

## 2020-10-28 MED ORDER — PROGESTERONE 200 MG PO CAPS: ORAL_CAPSULE | ORAL | 0 refills | Status: DC

## 2020-10-28 MED ORDER — ESTRADIOL 1 MG PO TABS: 2.0000 mg | ORAL_TABLET | Freq: Every day | ORAL | 0 refills | Status: DC

## 2020-10-28 NOTE — Telephone Encounter (Signed)
Patient requesting 90 days supply refill on  The Estradiol 0.1 mg and the Progesterone 200mg  as she states her insurance co requires.  Last AEX 09/23/2019 AEX Scheduled for 01/05/21

## 2020-11-11 DIAGNOSIS — G5601 Carpal tunnel syndrome, right upper limb: Secondary | ICD-10-CM | POA: Diagnosis not present

## 2020-11-11 DIAGNOSIS — G5622 Lesion of ulnar nerve, left upper limb: Secondary | ICD-10-CM | POA: Diagnosis not present

## 2020-11-17 ENCOUNTER — Other Ambulatory Visit: Payer: Self-pay

## 2020-11-17 ENCOUNTER — Encounter: Payer: Self-pay | Admitting: Obstetrics and Gynecology

## 2020-11-17 MED ORDER — ESTRADIOL 2 MG VA RING: 2.0000 mg | VAGINAL_RING | VAGINAL | 0 refills | Status: DC

## 2020-11-17 NOTE — Telephone Encounter (Signed)
Last AEX 09/23/19 Scheduled AEX 01/05/21

## 2020-11-22 DIAGNOSIS — G5622 Lesion of ulnar nerve, left upper limb: Secondary | ICD-10-CM | POA: Diagnosis not present

## 2020-11-22 DIAGNOSIS — G5601 Carpal tunnel syndrome, right upper limb: Secondary | ICD-10-CM | POA: Diagnosis not present

## 2020-12-27 ENCOUNTER — Encounter: Payer: Self-pay | Admitting: *Deleted

## 2020-12-29 ENCOUNTER — Encounter: Payer: Self-pay | Admitting: Diagnostic Neuroimaging

## 2020-12-29 ENCOUNTER — Telehealth: Payer: Self-pay | Admitting: *Deleted

## 2020-12-29 ENCOUNTER — Ambulatory Visit: Payer: BC Managed Care – PPO | Admitting: Diagnostic Neuroimaging

## 2020-12-29 NOTE — Telephone Encounter (Signed)
Patient was no show for new patient appointment today. 

## 2021-01-04 NOTE — Progress Notes (Deleted)
50 y.o. G30P0 Married Caucasian female here for annual exam.    PCP:     Patient's last menstrual period was 12/16/2013 (exact date).           Sexually active: {yes no:314532}  The current method of family planning is post menopausal status.    Exercising: {yes DQ:[id number]}  {types:19826} Smoker:  Former  Health Maintenance: Pap: 08/2017 normal per patient History of abnormal Pap:  no MMG:  02-10-20 3D/Neg/BiRads1 Colonoscopy:  ***NEVER BMD:  n/a  Result  n/a TDaP: 09-23-19 Gardasil:   no HIV:07-03-13 NR Hep C: 07-03-13 Neg Screening Labs:  Hb today: ***, Urine today: ***   reports that she quit smoking about 26 years ago. She has never used smokeless tobacco. She reports current alcohol use. She reports that she does not use drugs.  Past Medical History:  Diagnosis Date  . GERD (gastroesophageal reflux disease)   . Group A streptococcal infection   . Hormone disorder   . Low TSH level 2021  . Pericardial effusion   . Secondary cardiomyopathy (HCC)   . Toxic shock (HCC)   . Toxic shock [facility])     Past Surgical History:  Procedure Laterality Date  . ANTERIOR CERVICAL DECOMP/DISCECTOMY FUSION  11/18/2009   C5-7  . CARPAL TUNNEL RELEASE Left   . CESAREAN SECTION  2008   complete placenta pervia, hemmorage  . CHEILECTOMY Left 05/01/2013   Procedure: LEFT GREAT TOE HALLUX RIGIDUS WITH CHEILECTOMY 1ST METATARSOPHALANGEAL;  Surgeon: Loreta Ave, MD;  Location: [facility];  Service: Orthopedics;  Laterality: Left;  . CHEILECTOMY Right 06/02/2013   procedule:right great toe chielectomy  . HYSTEROSCOPY  10/2017   done by Dr. Pennie Rushing   . I & D EXTREMITY Right 07/02/2013   Procedure: IRRIGATION AND DEBRIDEMENT RIGHT 1ST Metaphalangeal JOINT;  Surgeon: Loreta Ave, MD;  Location: [facility] OR;  Service: Orthopedics;  Laterality: Right;  . KNEE SURGERY  [phone]   7 surgeries left knee(2 lateral releases, 1 tibial tubercle oxtomy, 1 patellectomy)    Current  Outpatient Medications  Medication Sig Dispense Refill  . estradiol (ESTRACE) 1 MG tablet Take 2 tablets (2 mg total) by mouth daily. 180 tablet 0  . estradiol (ESTRING) 2 MG vaginal ring Place 2 mg vaginally every 3 (three) months. Insert a new ring into vagina every 3 months 1 each 0  . Multiple Vitamins-Minerals (MULTIVITAMIN ADULT PO) Take 1 tablet by mouth daily.    . progesterone (PROMETRIUM) 100 MG capsule progesterone micronized 100 mg capsule  TAKE 2 CAPSULES BY MOUTH AT BEDTIME    . progesterone (PROMETRIUM) 200 MG capsule TAKE 1 CAPSULE BY MOUTH EVERY DAY 90 capsule 0   No current facility-administered medications for this visit.    Family History  Problem Relation Age of Onset  . Hypertension Father   . Alzheimer's disease Father   . Hypertension Mother   . Hyperlipidemia Mother   . Alzheimer's disease Mother   . Migraines Sister   . Kidney disease Maternal Grandmother   . Heart attack Maternal Grandfather 63  . Breast cancer Neg Hx     Review of Systems  Exam:   LMP 12/16/2013 (Exact Date)     General appearance: alert, cooperative and appears stated age Head: normocephalic, without obvious abnormality, atraumatic Neck: no adenopathy, supple, symmetrical, trachea midline and thyroid normal to inspection and palpation Lungs: clear to auscultation bilaterally Breasts: normal appearance, no masses or tenderness, No nipple retraction or dimpling, No nipple discharge  or bleeding, No axillary adenopathy Heart: regular rate and rhythm Abdomen: soft, non-tender; no masses, no organomegaly Extremities: extremities normal, atraumatic, no cyanosis or edema Skin: skin color, texture, turgor normal. No rashes or lesions Lymph nodes: cervical, supraclavicular, and axillary nodes normal. Neurologic: grossly normal  Pelvic: External genitalia:  no lesions              No abnormal inguinal nodes palpated.              Urethra:  normal appearing urethra with no masses,  tenderness or lesions              Bartholins and Skenes: normal                 Vagina: normal appearing vagina with normal color and discharge, no lesions              Cervix: no lesions              Pap taken: {yes no:314532} Bimanual Exam:  Uterus:  normal size, contour, position, consistency, mobility, non-tender              Adnexa: no mass, fullness, tenderness              Rectal exam: {yes no:314532}.  Confirms.              Anus:  normal sphincter tone, no lesions  Chaperone was present for exam.  Assessment:   Well woman visit with normal exam.   Plan: Mammogram screening discussed. Self breast awareness reviewed. Pap and HR HPV as above. Guidelines for Calcium, Vitamin D, regular exercise program including cardiovascular and weight bearing exercise.   Follow up annually and prn.   Additional counseling given.  {yes [id number]. _______ minutes face to face time of which over 50% was spent in counseling.    After visit summary provided.

## 2021-01-05 ENCOUNTER — Ambulatory Visit: Payer: BC Managed Care – PPO | Admitting: Obstetrics and Gynecology

## 2021-02-01 DIAGNOSIS — M25562 Pain in left knee: Secondary | ICD-10-CM | POA: Diagnosis not present

## 2021-02-03 ENCOUNTER — Ambulatory Visit (INDEPENDENT_AMBULATORY_CARE_PROVIDER_SITE_OTHER): Payer: BC Managed Care – PPO | Admitting: Obstetrics and Gynecology

## 2021-02-03 ENCOUNTER — Encounter: Payer: Self-pay | Admitting: Obstetrics and Gynecology

## 2021-02-03 ENCOUNTER — Other Ambulatory Visit: Payer: Self-pay

## 2021-02-03 VITALS — BP 110/60 | HR 80 | Ht 65.5 in | Wt 162.0 lb

## 2021-02-03 DIAGNOSIS — E039 Hypothyroidism, unspecified: Secondary | ICD-10-CM

## 2021-02-03 DIAGNOSIS — Z79890 Hormone replacement therapy (postmenopausal): Secondary | ICD-10-CM

## 2021-02-03 DIAGNOSIS — Z01419 Encounter for gynecological examination (general) (routine) without abnormal findings: Secondary | ICD-10-CM | POA: Diagnosis not present

## 2021-02-03 DIAGNOSIS — R5383 Other fatigue: Secondary | ICD-10-CM | POA: Diagnosis not present

## 2021-02-03 MED ORDER — ESTRADIOL 2 MG PO TABS: 2.0000 mg | ORAL_TABLET | Freq: Every day | ORAL | 3 refills | Status: DC

## 2021-02-03 MED ORDER — ESTRADIOL 2 MG VA RING: 2.0000 mg | VAGINAL_RING | VAGINAL | 3 refills | Status: DC

## 2021-02-03 MED ORDER — PROGESTERONE 200 MG PO CAPS: ORAL_CAPSULE | ORAL | 3 refills | Status: DC

## 2021-02-03 NOTE — Progress Notes (Signed)
50 y.o. G58P0 Married Caucasian female here for annual exam.    Patient feeling anxious, "keyed up". She would like some labs today. Taking NP thyroid. She takes this with [employer].  She has considered stopping this.  No situation change.   She uses HRT and is off testosterone cream for at least one year.  No vaginal bleeding.  Feels like vaginal estrogen ring is really working well.  PCP:   Merri Brunette, MD  Patient's last menstrual period was 12/16/2013 (exact date).           Sexually active: Yes.    The current method of family planning is post menopausal status.    Exercising: Yes.     Weights, cardio Smoker:  Former  Health Maintenance: Pap:   08/2017 at [facility]--normal per patient History of abnormal Pap:  no MMG:  6-29-21D/Neg/BiRads1 Colonoscopy: NEVER BMD: ~2018  Result :normal per patient.  Will do age 54 yo. TDaP: 09-23-19 Gardasil:   no HIV:07-03-13 NR Hep C: 07-03-13 Neg Screening Labs: today.    reports that she quit smoking about 26 years ago. Her smoking use included cigarettes. She has never used smokeless tobacco. She reports current alcohol use. She reports that she does not use drugs.  Past Medical History:  Diagnosis Date   GERD (gastroesophageal reflux disease)    Group A streptococcal infection    Hormone disorder    Low TSH level 2021   Pericardial effusion    Secondary cardiomyopathy (HCC)    Toxic shock (HCC)    Toxic shock (HCC)     Past Surgical History:  Procedure Laterality Date   ANTERIOR CERVICAL DECOMP/DISCECTOMY FUSION  11/18/2009   C5-7   CARPAL TUNNEL RELEASE Left    CESAREAN SECTION  2008   complete placenta pervia, hemmorage   CHEILECTOMY Left 05/01/2013   Procedure: LEFT GREAT TOE HALLUX RIGIDUS WITH CHEILECTOMY 1ST METATARSOPHALANGEAL;  Surgeon: Loreta Ave, MD;  Location: [facility];  Service: Orthopedics;  Laterality: Left;   CHEILECTOMY Right 06/02/2013   procedule:right great toe chielectomy    HYSTEROSCOPY  10/2017   done by Dr. Pennie Rushing    I & D EXTREMITY Right 07/02/2013   Procedure: IRRIGATION AND DEBRIDEMENT RIGHT 1ST Metaphalangeal JOINT;  Surgeon: Loreta Ave, MD;  Location: [facility] OR;  Service: Orthopedics;  Laterality: Right;   KNEE SURGERY  [phone]   7 surgeries left knee(2 lateral releases, 1 tibial tubercle oxtomy, 1 patellectomy)    Current Outpatient Medications  Medication Sig Dispense Refill   estradiol (ESTRACE) 2 MG tablet      estradiol (ESTRING) 2 MG vaginal ring Place 2 mg vaginally every 3 (three) months. Insert a new ring into vagina every 3 months 1 each 0   Multiple Vitamins-Minerals (MULTIVITAMIN ADULT PO) Take 1 tablet by mouth daily.     progesterone (PROMETRIUM) 200 MG capsule TAKE 1 CAPSULE BY MOUTH EVERY DAY 90 capsule 0   thyroid (ARMOUR) 15 MG tablet      No current facility-administered medications for this visit.    Family History  Problem Relation Age of Onset   Hypertension Father    Alzheimer's disease Father    Hypertension Mother    Hyperlipidemia Mother    Alzheimer's disease Mother    Migraines Sister    Kidney disease Maternal Grandmother    Heart attack Maternal Grandfather 47   Breast cancer Neg Hx     Review of Systems  Psychiatric/Behavioral:  Positive for agitation (ANXIOUS).  All other systems reviewed and are negative.  Exam:   BP 110/60   Pulse 80   Ht 5' 5.5" (1.664 m)   Wt 162 lb (73.5 kg)   LMP 12/16/2013 (Exact Date)   SpO2 99%   BMI 26.55 kg/m     General appearance: alert, cooperative and appears stated age Head: normocephalic, without obvious abnormality, atraumatic Neck: no adenopathy, supple, symmetrical, trachea midline and thyroid normal to inspection and palpation Lungs: clear to auscultation bilaterally Breasts: normal appearance, no masses or tenderness, No nipple retraction or dimpling, No nipple discharge or bleeding, No axillary adenopathy Heart: regular rate and rhythm Abdomen: soft,  non-tender; no masses, no organomegaly Extremities: extremities normal, atraumatic, no cyanosis or edema Skin: skin color, texture, turgor normal. No rashes or lesions Lymph nodes: cervical, supraclavicular, and axillary nodes normal. Neurologic: grossly normal  Pelvic: External genitalia:  no lesions              No abnormal inguinal nodes palpated.              Urethra:  normal appearing urethra with no masses, tenderness or lesions              Bartholins and Skenes: normal                 Vagina: normal appearing vagina with normal color and discharge, no lesions              Cervix: no lesions              Pap taken: no Bimanual Exam:  Uterus:  normal size, contour, position, consistency, mobility, non-tender              Adnexa: no mass, fullness, tenderness              Rectal exam: declines  Chaperone was present for exam.  Assessment:   Well woman visit with normal exam. Premature menopause.  HRT with testosterone replacement.  Migraine with aura.   Hypothyroidism.  Off Nature thyroid.  Plan: Mammogram screening discussed. Self breast awareness reviewed. Pap and HR HPV 2024. Guidelines for Calcium, Vitamin D, regular exercise program including cardiovascular and weight bearing exercise. Discused WHI and use of HRT which can increase risk of PE, DVT, MI, stroke and breast cancer.  She will continue HRT and Estring.  Rx for one year of each.  Will do routine labs, TFTs, and estradiol level. If hormone labs are normal, consider life coach or reducing oral estrogen dosing.  Follow up annually and prn.    After visit summary provided.

## 2021-02-03 NOTE — Patient Instructions (Signed)

## 2021-02-04 LAB — COMPREHENSIVE METABOLIC PANEL
AG Ratio: 1.7 (calc) (ref 1.0–2.5)
ALT: 12 U/L (ref 6–29)
AST: 17 U/L (ref 10–35)
Albumin: 4.3 g/dL (ref 3.6–5.1)
Alkaline phosphatase (APISO): 55 U/L (ref 31–125)
BUN: 19 mg/dL (ref 7–25)
CO2: 28 mmol/L (ref 20–32)
Calcium: 9.4 mg/dL (ref 8.6–10.2)
Chloride: 103 mmol/L (ref 98–110)
Creat: 0.95 mg/dL (ref 0.50–1.10)
Globulin: 2.6 g/dL (calc) (ref 1.9–3.7)
Glucose, Bld: 69 mg/dL (ref 65–99)
Potassium: 4.2 mmol/L (ref 3.5–5.3)
Sodium: 139 mmol/L (ref 135–146)
Total Bilirubin: 0.8 mg/dL (ref 0.2–1.2)
Total Protein: 6.9 g/dL (ref 6.1–8.1)

## 2021-02-04 LAB — CBC
HCT: 42.3 % (ref 35.0–45.0)
Hemoglobin: 14.2 g/dL (ref 11.7–15.5)
MCH: 29.6 pg (ref 27.0–33.0)
MCHC: 33.6 g/dL (ref 32.0–36.0)
MCV: 88.3 fL (ref 80.0–100.0)
MPV: 10.9 fL (ref 7.5–12.5)
Platelets: 269 10*3/uL (ref 140–400)
RBC: 4.79 10*6/uL (ref 3.80–5.10)
RDW: 12.2 % (ref 11.0–15.0)
WBC: 5.7 10*3/uL (ref 3.8–10.8)

## 2021-02-04 LAB — VITAMIN D 25 HYDROXY (VIT D DEFICIENCY, FRACTURES): Vit D, 25-Hydroxy: 42 ng/mL (ref 30–100)

## 2021-02-04 LAB — TSH: TSH: 0.32 mIU/L — ABNORMAL LOW

## 2021-02-04 LAB — LIPID PANEL
Cholesterol: 270 mg/dL — ABNORMAL HIGH (ref <200)
HDL: 83 mg/dL (ref >=50)
LDL Cholesterol (Calc): 156 mg/dL (calc) — ABNORMAL HIGH
Non-HDL Cholesterol (Calc): 187 mg/dL (calc) — ABNORMAL HIGH (ref <130)
Total CHOL/HDL Ratio: 3.3 (calc) (ref <5.0)
Triglycerides: 173 mg/dL — ABNORMAL HIGH (ref <150)

## 2021-02-04 LAB — T4, FREE: Free T4: 1 ng/dL (ref 0.8–1.8)

## 2021-02-04 LAB — ESTRADIOL: Estradiol: 45 pg/mL

## 2021-02-07 ENCOUNTER — Other Ambulatory Visit: Payer: Self-pay | Admitting: Obstetrics and Gynecology

## 2021-02-07 DIAGNOSIS — Z1231 Encounter for screening mammogram for malignant neoplasm of breast: Secondary | ICD-10-CM

## 2021-03-02 DIAGNOSIS — M25562 Pain in left knee: Secondary | ICD-10-CM | POA: Diagnosis not present

## 2021-03-08 DIAGNOSIS — M25562 Pain in left knee: Secondary | ICD-10-CM | POA: Diagnosis not present

## 2021-03-15 ENCOUNTER — Encounter: Payer: Self-pay | Admitting: Obstetrics and Gynecology

## 2021-03-15 NOTE — Telephone Encounter (Signed)
I recommend an office visit so I can help her best.

## 2021-03-23 ENCOUNTER — Ambulatory Visit: Payer: BC Managed Care – PPO | Admitting: Obstetrics and Gynecology

## 2021-03-28 NOTE — Progress Notes (Signed)
GYNECOLOGY  VISIT   HPI: 50 y.o.   Married  Caucasian  female   G20P0 with Patient's last menstrual period was 12/16/2013 (exact date).   here to discuss mood swings.   Feels like she could cry easily, and it can occur daily. Can feel really low and then it will pass. Low energy.  Falls asleep quickly but does not sleep well.  Husband snores.  She denies suicidal ideation.   Used Wellbutrin in the past.  Used Amitryptaline and was suicidal as a young adult. Used Zoloft after birth of her child but she developed anxiety.   Interested in a weight neutral medication.   Work is busy.  Stressful busy.  Husband is doing cancer treatment.  He is doing well but not working.  He has stage 3 kidney cancer.   She feels better on the Estradiol 2 mg daily than on 1 mg daily.  GYNECOLOGIC HISTORY: Patient's last menstrual period was 12/16/2013 (exact date). Contraception:  PMP Menopausal hormone therapy: Estradiol 2mg , Estring, Prometrium 200mg  Last mammogram: 6-29-21D/Neg/BiRads1.  Apt.03/30/21 Last pap smear:  08/2017 at [facility]--normal per patient        OB History     Gravida  4   Para      Term      Preterm      AB      Living  4      SAB      IAB      Ectopic      Multiple      Live Births                 Patient Active Problem List   Diagnosis Date Noted   Recurrent infections 11/03/2013   Lung mass 07/31/2013   Toxic shock (HCC) 07/17/2013   Group A streptococcal infection    Pericardial effusion    SIRS (systemic inflammatory response syndrome) (HCC) 07/01/2013   Right foot infection 07/01/2013   Hypotension 07/01/2013   Tachycardia 07/01/2013   Septic shock (HCC) 07/01/2013   Tinea versicolor 11/07/2010   Constipation 11/07/2010   Radiculopathy of cervical spine 11/07/2010   OTHER MALAISE AND FATIGUE 09/01/2010   MITRAL VALVE PROLAPSE 10/07/2009   MIGRAINE WITH AURA 02/20/2008   GERD 02/20/2008    Past Medical History:  Diagnosis Date    GERD (gastroesophageal reflux disease)    Group A streptococcal infection    Hormone disorder    Low TSH level 2021   Pericardial effusion    Secondary cardiomyopathy (HCC)    Toxic shock (HCC)    Toxic shock (HCC)     Past Surgical History:  Procedure Laterality Date   ANTERIOR CERVICAL DECOMP/DISCECTOMY FUSION  11/18/2009   C5-7   CARPAL TUNNEL RELEASE Left    CESAREAN SECTION  2008   complete placenta pervia, hemmorage   CHEILECTOMY Left 05/01/2013   Procedure: LEFT GREAT TOE HALLUX RIGIDUS WITH CHEILECTOMY 1ST METATARSOPHALANGEAL;  Surgeon: 2009, MD;  Location: [facility];  Service: Orthopedics;  Laterality: Left;   CHEILECTOMY Right 06/02/2013   procedule:right great toe chielectomy   HYSTEROSCOPY  10/2017   done by Dr. 06/04/2013    I & D EXTREMITY Right 07/02/2013   Procedure: IRRIGATION AND DEBRIDEMENT RIGHT 1ST Metaphalangeal JOINT;  Surgeon: Pennie Rushing, MD;  Location: [facility] OR;  Service: Orthopedics;  Laterality: Right;   KNEE SURGERY  [phone]   7 surgeries left knee(2 lateral releases, 1 tibial tubercle oxtomy, 1 patellectomy)  Current Outpatient Medications  Medication Sig Dispense Refill   estradiol (ESTRACE) 2 MG tablet Take 1 tablet (2 mg total) by mouth daily. 90 tablet 3   estradiol (ESTRING) 2 MG vaginal ring Place 2 mg vaginally every 3 (three) months. Insert a new ring into vagina every 3 months 1 each 3   Multiple Vitamins-Minerals (MULTIVITAMIN ADULT PO) Take 1 tablet by mouth daily.     progesterone (PROMETRIUM) 200 MG capsule TAKE 1 CAPSULE BY MOUTH EVERY DAY 90 capsule 3   thyroid (ARMOUR) 15 MG tablet      No current facility-administered medications for this visit.     ALLERGIES: Codeine, Dilaudid [hydromorphone hcl], and Hydromorphone hcl  Family History  Problem Relation Age of Onset   Hypertension Father    Alzheimer's disease Father    Hypertension Mother    Hyperlipidemia Mother    Alzheimer's disease  Mother    Migraines Sister    Kidney disease Maternal Grandmother    Heart attack Maternal Grandfather 73   Breast cancer Neg Hx     Social History   Socioeconomic History   Marital status: Married    Spouse name: Molli Hazard   Number of children: 4   Years of education: Not on file   Highest education level: Not on file  Occupational History   Occupation: stay at home mom    Employer: UNEMPLOYED  Tobacco Use   Smoking status: Former    Types: Cigarettes    Quit date: 11/03/1994    Years since quitting: 26.4   Smokeless tobacco: Never  Vaping Use   Vaping Use: Never used  Substance and Sexual Activity   Alcohol use: Yes    Comment: 2 drinks/month   Drug use: No   Sexual activity: Yes    Birth control/protection: Post-menopausal  Other Topics Concern   Not on file  Social History Narrative   Exercise 2 times weekly      Diet : Fruit, veggies, water   Social Determinants of Health   Financial Resource Strain: Not on file  Food Insecurity: Not on file  Transportation Needs: Not on file  Physical Activity: Not on file  Stress: Not on file  Social Connections: Not on file  Intimate Partner Violence: Not on file    Review of Systems  Psychiatric/Behavioral:         Mood swings  All other systems reviewed and are negative.  PHYSICAL EXAMINATION:    BP 110/70   Pulse 69   Ht 5' 5.5" (1.664 m)   Wt 162 lb (73.5 kg)   LMP 12/16/2013 (Exact Date)   SpO2 100%   BMI 26.55 kg/m     General appearance: alert, cooperative and appears stated age   ASSESSMENT  Depression. HRT.  PLAN  Restart Wellbutrin XL 150 mg daily.  #90.  RF none for now. She will let me know if it is working or not.  If not working well and we decide not to increase the dosage, will refer to Psychiatry.  She will reach out to the [facility] to pursue counseling.  I did give her a list of several options for counseling.  FU prn.     32 min total time was spent for this patient  encounter, including preparation, face-to-face counseling with the patient, coordination of care, and documentation of the encounter.

## 2021-03-30 ENCOUNTER — Encounter: Payer: Self-pay | Admitting: Obstetrics and Gynecology

## 2021-03-30 ENCOUNTER — Ambulatory Visit: Payer: BC Managed Care – PPO | Admitting: Obstetrics and Gynecology

## 2021-03-30 ENCOUNTER — Other Ambulatory Visit: Payer: Self-pay

## 2021-03-30 VITALS — BP 110/70 | HR 69 | Ht 65.5 in | Wt 162.0 lb

## 2021-03-30 DIAGNOSIS — F32A Depression, unspecified: Secondary | ICD-10-CM

## 2021-03-30 MED ORDER — BUPROPION HCL ER (XL) 150 MG PO TB24: 150.0000 mg | ORAL_TABLET | Freq: Every day | ORAL | 0 refills | Status: DC

## 2021-03-30 NOTE — Patient Instructions (Addendum)
Consider the following for counseling and life coaching:  [facility]) [facility] A religious community of your choosing

## 2021-05-11 DIAGNOSIS — F3181 Bipolar II disorder: Secondary | ICD-10-CM | POA: Diagnosis not present

## 2021-05-26 DIAGNOSIS — F3181 Bipolar II disorder: Secondary | ICD-10-CM | POA: Diagnosis not present

## 2021-05-27 DIAGNOSIS — F411 Generalized anxiety disorder: Secondary | ICD-10-CM | POA: Diagnosis not present

## 2021-06-03 DIAGNOSIS — F411 Generalized anxiety disorder: Secondary | ICD-10-CM | POA: Diagnosis not present

## 2021-06-08 DIAGNOSIS — M2021 Hallux rigidus, right foot: Secondary | ICD-10-CM | POA: Diagnosis not present

## 2021-06-08 DIAGNOSIS — M2022 Hallux rigidus, left foot: Secondary | ICD-10-CM | POA: Diagnosis not present

## 2021-06-08 DIAGNOSIS — M79671 Pain in right foot: Secondary | ICD-10-CM | POA: Diagnosis not present

## 2021-06-10 DIAGNOSIS — F411 Generalized anxiety disorder: Secondary | ICD-10-CM | POA: Diagnosis not present

## 2021-06-21 DIAGNOSIS — G8918 Other acute postprocedural pain: Secondary | ICD-10-CM | POA: Diagnosis not present

## 2021-06-21 DIAGNOSIS — M2022 Hallux rigidus, left foot: Secondary | ICD-10-CM | POA: Diagnosis not present

## 2021-06-28 ENCOUNTER — Encounter: Payer: Self-pay | Admitting: Obstetrics and Gynecology

## 2021-06-28 ENCOUNTER — Ambulatory Visit
Admission: RE | Admit: 2021-06-28 | Discharge: 2021-06-28 | Disposition: A | Discharge status: Home / Self Care | Payer: BC Managed Care – PPO | Source: Ambulatory Visit | Attending: Obstetrics and Gynecology | Admitting: Obstetrics and Gynecology

## 2021-06-28 DIAGNOSIS — Z1231 Encounter for screening mammogram for malignant neoplasm of breast: Secondary | ICD-10-CM

## 2021-06-29 MED ORDER — BUPROPION HCL ER (XL) 300 MG PO TB24: 300.0000 mg | ORAL_TABLET | Freq: Every day | ORAL | 0 refills | Status: DC

## 2021-06-29 NOTE — Telephone Encounter (Signed)
Ok to increase Wellbutrin XL to 300 mg daily for 3 months.  Wellbutrin XL 150 mg is a low dosage.  If no significant improvement on the increased dosage or if does not tolerate, I would recommend establishing care with psychiatry.

## 2021-07-12 DIAGNOSIS — F411 Generalized anxiety disorder: Secondary | ICD-10-CM | POA: Diagnosis not present

## 2021-07-27 DIAGNOSIS — F411 Generalized anxiety disorder: Secondary | ICD-10-CM | POA: Diagnosis not present

## 2021-08-03 DIAGNOSIS — M2022 Hallux rigidus, left foot: Secondary | ICD-10-CM | POA: Diagnosis not present

## 2021-08-09 DIAGNOSIS — M2021 Hallux rigidus, right foot: Secondary | ICD-10-CM | POA: Diagnosis not present

## 2021-08-09 DIAGNOSIS — G8918 Other acute postprocedural pain: Secondary | ICD-10-CM | POA: Diagnosis not present

## 2021-08-24 DIAGNOSIS — M2550 Pain in unspecified joint: Secondary | ICD-10-CM | POA: Diagnosis not present

## 2021-09-19 ENCOUNTER — Other Ambulatory Visit: Payer: Self-pay

## 2021-09-19 DIAGNOSIS — Z4889 Encounter for other specified surgical aftercare: Secondary | ICD-10-CM | POA: Diagnosis not present

## 2021-09-19 DIAGNOSIS — M2021 Hallux rigidus, right foot: Secondary | ICD-10-CM | POA: Diagnosis not present

## 2021-09-19 DIAGNOSIS — M2022 Hallux rigidus, left foot: Secondary | ICD-10-CM | POA: Diagnosis not present

## 2021-09-19 MED ORDER — BUPROPION HCL ER (XL) 300 MG PO TB24: 300.0000 mg | ORAL_TABLET | Freq: Every day | ORAL | 1 refills | Status: DC

## 2021-09-19 NOTE — Telephone Encounter (Signed)
AEX 02/10/21 

## 2021-09-21 DIAGNOSIS — F411 Generalized anxiety disorder: Secondary | ICD-10-CM | POA: Diagnosis not present

## 2021-10-05 DIAGNOSIS — M898X1 Other specified disorders of bone, shoulder: Secondary | ICD-10-CM | POA: Diagnosis not present

## 2021-10-05 DIAGNOSIS — G5622 Lesion of ulnar nerve, left upper limb: Secondary | ICD-10-CM | POA: Diagnosis not present

## 2021-10-05 DIAGNOSIS — R030 Elevated blood-pressure reading, without diagnosis of hypertension: Secondary | ICD-10-CM | POA: Diagnosis not present

## 2021-10-05 DIAGNOSIS — M5412 Radiculopathy, cervical region: Secondary | ICD-10-CM | POA: Diagnosis not present

## 2021-10-26 DIAGNOSIS — M542 Cervicalgia: Secondary | ICD-10-CM | POA: Diagnosis not present

## 2021-10-26 DIAGNOSIS — Z981 Arthrodesis status: Secondary | ICD-10-CM | POA: Diagnosis not present

## 2021-12-05 ENCOUNTER — Other Ambulatory Visit: Payer: Self-pay

## 2021-12-05 DIAGNOSIS — Z79890 Hormone replacement therapy (postmenopausal): Secondary | ICD-10-CM

## 2021-12-05 MED ORDER — PROGESTERONE 200 MG PO CAPS: ORAL_CAPSULE | ORAL | 0 refills | Status: DC

## 2021-12-05 MED ORDER — ESTRADIOL 2 MG PO TABS: 2.0000 mg | ORAL_TABLET | Freq: Every day | ORAL | 0 refills | Status: DC

## 2021-12-05 NOTE — Telephone Encounter (Signed)
Last AEX 02/03/21--not scheduled yet. ?Last mammo 06/28/21-neg birads 1 ?

## 2021-12-06 DIAGNOSIS — M25512 Pain in left shoulder: Secondary | ICD-10-CM | POA: Diagnosis not present

## 2021-12-06 DIAGNOSIS — M25552 Pain in left hip: Secondary | ICD-10-CM | POA: Diagnosis not present

## 2021-12-06 DIAGNOSIS — M79605 Pain in left leg: Secondary | ICD-10-CM | POA: Diagnosis not present

## 2021-12-06 DIAGNOSIS — M79602 Pain in left arm: Secondary | ICD-10-CM | POA: Diagnosis not present

## 2021-12-26 DIAGNOSIS — G5622 Lesion of ulnar nerve, left upper limb: Secondary | ICD-10-CM | POA: Diagnosis not present

## 2021-12-26 DIAGNOSIS — G5602 Carpal tunnel syndrome, left upper limb: Secondary | ICD-10-CM | POA: Diagnosis not present

## 2022-01-18 DIAGNOSIS — M79602 Pain in left arm: Secondary | ICD-10-CM | POA: Diagnosis not present

## 2022-01-18 DIAGNOSIS — M79605 Pain in left leg: Secondary | ICD-10-CM | POA: Diagnosis not present

## 2022-01-18 DIAGNOSIS — M25512 Pain in left shoulder: Secondary | ICD-10-CM | POA: Diagnosis not present

## 2022-01-18 DIAGNOSIS — M25552 Pain in left hip: Secondary | ICD-10-CM | POA: Diagnosis not present

## 2022-01-26 ENCOUNTER — Ambulatory Visit: Payer: BC Managed Care – PPO | Admitting: Obstetrics and Gynecology

## 2022-01-31 NOTE — Progress Notes (Signed)
51 y.o. G50P0 Married Caucasian female here for annual exam.    States she would like to feel better.  Really busy with responsibilities.  Exercising less and not eating as well.   Taking HRT and Wellbutrin XL.   Her last TSH was low.   Also using Estring that she gets from [employer].  Doing her master's degree.  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  Husband is doing well with his health.  PCP:   None  Patient's last menstrual period was 12/16/2013 (exact date).           Sexually active: Yes.    The current method of family planning is post menopausal status.    Exercising: Yes.     Strength, weights, walking Smoker:  Former  Health Maintenance: Pap:   08/2017 at [facility]--normal per patient History of abnormal Pap:  no MMG:  06-28-21 Neg/BiRads1 Colonoscopy:  NEVER BMD: ~2018  Result :Normal per patient TDaP:  09-23-19 Gardasil:   no HIV: 07-03-13 NR Hep C: 07-03-13 Neg Screening Labs:  PCP   reports that she quit smoking about 27 years ago. Her smoking use included cigarettes. She has never used smokeless tobacco. She reports current alcohol use. She reports that she does not use drugs.  Past Medical History:  Diagnosis Date   GERD (gastroesophageal reflux disease)    Group A streptococcal infection    Hormone disorder    Low TSH level 2021   Pericardial effusion    Secondary cardiomyopathy (HCC)    Toxic shock (HCC)    Toxic shock (HCC)     Past Surgical History:  Procedure Laterality Date   ANTERIOR CERVICAL DECOMP/DISCECTOMY FUSION  11/18/2009   C5-7   CARPAL TUNNEL RELEASE Left    CESAREAN SECTION  2008   complete placenta pervia, hemmorage   CHEILECTOMY Left 05/01/2013   Procedure: LEFT GREAT TOE HALLUX RIGIDUS WITH CHEILECTOMY 1ST METATARSOPHALANGEAL;  Surgeon: Loreta Ave, MD;  [facility];  Service: Orthopedics;  Laterality: Left;   CHEILECTOMY Right 06/02/2013   procedule:right great toe chielectomy    HYSTEROSCOPY  10/2017   done by Dr. Pennie Rushing    I & D EXTREMITY Right 07/02/2013   Procedure: IRRIGATION AND DEBRIDEMENT RIGHT 1ST Metaphalangeal JOINT;  Surgeon: Loreta Ave, MD;  Location: [facility] OR;  Service: Orthopedics;  Laterality: Right;   KNEE SURGERY  [phone]   7 surgeries left knee(2 lateral releases, 1 tibial tubercle oxtomy, 1 patellectomy)   TOE FUSION     ulna nerve release Left 12/12/2021    Current Outpatient Medications  Medication Sig Dispense Refill   buPROPion (WELLBUTRIN XL) 300 MG 24 hr tablet Take 1 tablet (300 mg total) by mouth daily. 90 tablet 1   buPROPion (WELLBUTRIN XL) 300 MG 24 hr tablet 1 tablet in the morning     estradiol (ESTRACE) 2 MG tablet Take 1 tablet (2 mg total) by mouth daily. 90 tablet 0   Multiple Vitamins-Minerals (MULTIVITAMIN ADULT PO) Take 1 tablet by mouth daily.     progesterone (PROMETRIUM) 200 MG capsule TAKE 1 CAPSULE BY MOUTH EVERY DAY 90 capsule 0   No current facility-administered medications for this visit.    Family History  Problem Relation Age of Onset   Hypertension Father    Alzheimer's disease Father    Hypertension Mother    Hyperlipidemia Mother    Alzheimer's disease Mother    Migraines Sister    Kidney disease Maternal Grandmother  Heart attack Maternal Grandfather 63   Breast cancer Neg Hx     Review of Systems  All other systems reviewed and are negative.   Exam:   BP 116/70   Pulse 78   Ht 5\' 5"  (1.651 m)   Wt 164 lb (74.4 kg)   LMP 12/16/2013 (Exact Date)   SpO2 98%   BMI 27.29 kg/m     General appearance: alert, cooperative and appears stated age Head: normocephalic, without obvious abnormality, atraumatic Neck: no adenopathy, supple, symmetrical, trachea midline and thyroid normal to inspection and palpation Lungs: clear to auscultation bilaterally Breasts: normal appearance, no masses or tenderness, No nipple retraction or dimpling, No nipple discharge or bleeding, No axillary  adenopathy Heart: regular rate and rhythm Abdomen: soft, non-tender; no masses, no organomegaly Extremities: extremities normal, atraumatic, no cyanosis or edema Skin: skin color, texture, turgor normal. No rashes or lesions Lymph nodes: cervical, supraclavicular, and axillary nodes normal. Neurologic: grossly normal  Pelvic: External genitalia:  no lesions              No abnormal inguinal nodes palpated.              Urethra:  normal appearing urethra with no masses, tenderness or lesions              Bartholins and Skenes: normal                 Vagina: normal appearing vagina with normal color and discharge, no lesions              Cervix: no lesions              Pap taken: no Bimanual Exam:  Uterus:  normal size, contour, position, consistency, mobility, non-tender              Adnexa: no mass, fullness, tenderness              Rectal exam: Declined.   Chaperone was present for exam:  02/15/2014, CMA  Assessment:   Well woman visit with gynecologic exam. Premature menopause.  HRT.   Hx depression. Migraine with aura.   Low TSH.  Elevated cholesterol.  Colon cancer screening.   Plan: Mammogram screening discussed. Self breast awareness reviewed. Pap and HR HPV 2024.  Guidelines for Calcium, Vitamin D. Encouragement for regular exercise program that fits her current life. Discused WHI and use of HRT which can increase risk of PE, DVT, MI, stroke and breast cancer.  Rx for Estrace and Prometrium for one year.  Rx for Estring.  Refill of Wellbutrin XL 300 mg.  Cologuard ordered.  Routine labs.  List of PCPs to patient.  Follow up annually and prn.   After visit summary provided.

## 2022-02-02 ENCOUNTER — Ambulatory Visit (INDEPENDENT_AMBULATORY_CARE_PROVIDER_SITE_OTHER): Payer: BC Managed Care – PPO | Admitting: Obstetrics and Gynecology

## 2022-02-02 ENCOUNTER — Encounter: Payer: Self-pay | Admitting: Obstetrics and Gynecology

## 2022-02-02 VITALS — BP 116/70 | HR 78 | Ht 65.0 in | Wt 164.0 lb

## 2022-02-02 DIAGNOSIS — E7800 Pure hypercholesterolemia, unspecified: Secondary | ICD-10-CM

## 2022-02-02 DIAGNOSIS — Z01419 Encounter for gynecological examination (general) (routine) without abnormal findings: Secondary | ICD-10-CM | POA: Diagnosis not present

## 2022-02-02 DIAGNOSIS — Z1211 Encounter for screening for malignant neoplasm of colon: Secondary | ICD-10-CM

## 2022-02-02 DIAGNOSIS — R7989 Other specified abnormal findings of blood chemistry: Secondary | ICD-10-CM | POA: Diagnosis not present

## 2022-02-02 DIAGNOSIS — Z79890 Hormone replacement therapy (postmenopausal): Secondary | ICD-10-CM

## 2022-02-02 MED ORDER — PROGESTERONE 200 MG PO CAPS: ORAL_CAPSULE | ORAL | 3 refills | Status: DC

## 2022-02-02 MED ORDER — BUPROPION HCL ER (XL) 300 MG PO TB24: ORAL_TABLET | ORAL | 3 refills | Status: DC

## 2022-02-02 MED ORDER — ESTRADIOL 2 MG VA RING
2.0000 mg | VAGINAL_RING | VAGINAL | 3 refills | Status: DC
Start: 2022-02-02 — End: 2022-08-23

## 2022-02-02 MED ORDER — ESTRADIOL 2 MG PO TABS: 2.0000 mg | ORAL_TABLET | Freq: Every day | ORAL | 3 refills | Status: DC

## 2022-02-02 NOTE — Patient Instructions (Signed)

## 2022-02-03 LAB — COMPREHENSIVE METABOLIC PANEL
AG Ratio: 1.7 (calc) (ref 1.0–2.5)
ALT: 13 U/L (ref 6–29)
AST: 15 U/L (ref 10–35)
Albumin: 4.3 g/dL (ref 3.6–5.1)
Alkaline phosphatase (APISO): 59 U/L (ref 37–153)
BUN/Creatinine Ratio: 14 (calc) (ref 6–22)
BUN: 16 mg/dL (ref 7–25)
CO2: 25 mmol/L (ref 20–32)
Calcium: 9.7 mg/dL (ref 8.6–10.4)
Chloride: 102 mmol/L (ref 98–110)
Creat: 1.15 mg/dL — ABNORMAL HIGH (ref 0.50–1.03)
Globulin: 2.6 g/dL (calc) (ref 1.9–3.7)
Glucose, Bld: 72 mg/dL (ref 65–99)
Potassium: 4.9 mmol/L (ref 3.5–5.3)
Sodium: 139 mmol/L (ref 135–146)
Total Bilirubin: 0.5 mg/dL (ref 0.2–1.2)
Total Protein: 6.9 g/dL (ref 6.1–8.1)

## 2022-02-03 LAB — CBC
HCT: 38.9 % (ref 35.0–45.0)
Hemoglobin: 13.3 g/dL (ref 11.7–15.5)
MCH: 30.5 pg (ref 27.0–33.0)
MCHC: 34.2 g/dL (ref 32.0–36.0)
MCV: 89.2 fL (ref 80.0–100.0)
MPV: 10.8 fL (ref 7.5–12.5)
Platelets: 286 10*3/uL (ref 140–400)
RBC: 4.36 10*6/uL (ref 3.80–5.10)
RDW: 12.2 % (ref 11.0–15.0)
WBC: 6 10*3/uL (ref 3.8–10.8)

## 2022-02-03 LAB — LIPID PANEL
Cholesterol: 261 mg/dL — ABNORMAL HIGH (ref <200)
HDL: 92 mg/dL (ref >=50)
LDL Cholesterol (Calc): 142 mg/dL (calc) — ABNORMAL HIGH
Non-HDL Cholesterol (Calc): 169 mg/dL (calc) — ABNORMAL HIGH (ref <130)
Total CHOL/HDL Ratio: 2.8 (calc) (ref <5.0)
Triglycerides: 141 mg/dL (ref <150)

## 2022-02-03 LAB — TSH: TSH: 1.86 mIU/L

## 2022-02-03 LAB — T4, FREE: Free T4: 1.1 ng/dL (ref 0.8–1.8)

## 2022-02-20 DIAGNOSIS — M5023 Other cervical disc displacement, cervicothoracic region: Secondary | ICD-10-CM | POA: Diagnosis not present

## 2022-02-20 DIAGNOSIS — M47816 Spondylosis without myelopathy or radiculopathy, lumbar region: Secondary | ICD-10-CM | POA: Diagnosis not present

## 2022-02-21 ENCOUNTER — Other Ambulatory Visit: Payer: Self-pay | Admitting: Neurological Surgery

## 2022-02-21 DIAGNOSIS — M5412 Radiculopathy, cervical region: Secondary | ICD-10-CM

## 2022-02-23 ENCOUNTER — Other Ambulatory Visit: Payer: BC Managed Care – PPO

## 2022-02-23 DIAGNOSIS — R7989 Other specified abnormal findings of blood chemistry: Secondary | ICD-10-CM | POA: Diagnosis not present

## 2022-02-24 LAB — BASIC METABOLIC PANEL WITH GFR
BUN/Creatinine Ratio: 21 (calc) (ref 6–22)
BUN: 22 mg/dL (ref 7–25)
CO2: 27 mmol/L (ref 20–32)
Calcium: 9.8 mg/dL (ref 8.6–10.4)
Chloride: 99 mmol/L (ref 98–110)
Creat: 1.05 mg/dL — ABNORMAL HIGH (ref 0.50–1.03)
Glucose, Bld: 87 mg/dL (ref 65–99)
Potassium: 5 mmol/L (ref 3.5–5.3)
Sodium: 137 mmol/L (ref 135–146)
eGFR: 65 mL/min/{1.73_m2} (ref >=60)

## 2022-02-28 ENCOUNTER — Encounter: Payer: Self-pay | Admitting: Obstetrics and Gynecology

## 2022-03-11 ENCOUNTER — Other Ambulatory Visit: Payer: BC Managed Care – PPO

## 2022-05-20 ENCOUNTER — Telehealth: Payer: Self-pay | Admitting: Obstetrics and Gynecology

## 2022-05-20 NOTE — Telephone Encounter (Signed)
Please remind patient to complete her Cologuard.  If she declines to do the test, I can cancel it for her.

## 2022-05-31 ENCOUNTER — Ambulatory Visit
Admission: RE | Admit: 2022-05-31 | Discharge: 2022-05-31 | Disposition: A | Discharge status: Home / Self Care | Payer: BC Managed Care – PPO | Source: Ambulatory Visit | Attending: Neurological Surgery | Admitting: Neurological Surgery

## 2022-05-31 DIAGNOSIS — M5412 Radiculopathy, cervical region: Secondary | ICD-10-CM

## 2022-05-31 DIAGNOSIS — M4802 Spinal stenosis, cervical region: Secondary | ICD-10-CM | POA: Diagnosis not present

## 2022-06-05 ENCOUNTER — Other Ambulatory Visit: Payer: Self-pay | Admitting: Neurological Surgery

## 2022-06-05 DIAGNOSIS — M5412 Radiculopathy, cervical region: Secondary | ICD-10-CM

## 2022-06-08 NOTE — Telephone Encounter (Signed)
Left message for patient to call. My chart message came back unread.

## 2022-06-15 NOTE — Telephone Encounter (Signed)
I will go ahead and cancel the order if she does not want to complete the Cologuard by the end of November.

## 2022-06-21 DIAGNOSIS — Z6827 Body mass index (BMI) 27.0-27.9, adult: Secondary | ICD-10-CM | POA: Diagnosis not present

## 2022-06-21 DIAGNOSIS — M5412 Radiculopathy, cervical region: Secondary | ICD-10-CM | POA: Diagnosis not present

## 2022-07-25 DIAGNOSIS — M5412 Radiculopathy, cervical region: Secondary | ICD-10-CM | POA: Diagnosis not present

## 2022-08-23 ENCOUNTER — Encounter: Payer: Self-pay | Admitting: Obstetrics and Gynecology

## 2022-08-23 ENCOUNTER — Other Ambulatory Visit: Payer: Self-pay | Admitting: Obstetrics and Gynecology

## 2022-08-23 DIAGNOSIS — Z1231 Encounter for screening mammogram for malignant neoplasm of breast: Secondary | ICD-10-CM

## 2022-08-23 MED ORDER — ESTRADIOL 10 MCG VA TABS: 1.0000 | ORAL_TABLET | VAGINAL | 0 refills | Status: DC

## 2022-08-23 NOTE — Progress Notes (Signed)
Patient request for alternative to Estring, which is on back order.

## 2022-09-20 ENCOUNTER — Encounter: Payer: Self-pay | Admitting: Family Medicine

## 2022-09-20 ENCOUNTER — Ambulatory Visit: Payer: BC Managed Care – PPO | Admitting: Family Medicine

## 2022-09-20 VITALS — BP 120/84 | HR 95 | Temp 97.8°F | Ht 65.0 in | Wt 170.0 lb

## 2022-09-20 DIAGNOSIS — R5383 Other fatigue: Secondary | ICD-10-CM | POA: Diagnosis not present

## 2022-09-20 DIAGNOSIS — G4700 Insomnia, unspecified: Secondary | ICD-10-CM | POA: Insufficient documentation

## 2022-09-20 DIAGNOSIS — E559 Vitamin D deficiency, unspecified: Secondary | ICD-10-CM | POA: Diagnosis not present

## 2022-09-20 DIAGNOSIS — Z7689 Persons encountering health services in other specified circumstances: Secondary | ICD-10-CM

## 2022-09-20 DIAGNOSIS — E7800 Pure hypercholesterolemia, unspecified: Secondary | ICD-10-CM | POA: Diagnosis not present

## 2022-09-20 DIAGNOSIS — G5600 Carpal tunnel syndrome, unspecified upper limb: Secondary | ICD-10-CM | POA: Insufficient documentation

## 2022-09-20 NOTE — Progress Notes (Signed)
New Patient Office Visit  Subjective    Patient ID: Shelly [name], female    DOB: [birth year]  Age: 52 y.o. MRN: [id number]  CC:  Chief Complaint  Patient presents with   Establish Care    Wants to check on cholesterol, family hx of it being high     HPI [place] presents to establish care OB/GYN - Dr. Quincy Simmonds   HL- 142 7 months ago. She is eating a healthy diet and exercises regularly.   Recently finished graduate school   Reports feeling more tired than usual.   Sleeps well.   Vitamin d def in past.   Works as Teacher, English as a foreign language   Hx of septic shock r/t foot surgery   Mother with MI, stroke and vascular dementia.  Father with dementia.     Outpatient Encounter Medications as of 09/20/2022  Medication Sig   buPROPion (WELLBUTRIN XL) 300 MG 24 hr tablet 1 tablet in the morning   estradiol (ESTRACE) 2 MG tablet Take 1 tablet (2 mg total) by mouth daily.   Estradiol (VAGIFEM) 10 MCG TABS vaginal tablet Place 1 tablet (10 mcg total) vaginally 2 (two) times a week. Use every night before bed for two weeks when you first begin this medicine, then after the first two weeks, begin using it twice a week.   Multiple Vitamins-Minerals (MULTIVITAMIN ADULT PO) Take 1 tablet by mouth daily.   progesterone (PROMETRIUM) 200 MG capsule TAKE 1 CAPSULE BY MOUTH EVERY DAY   No facility-administered encounter medications on file as of 09/20/2022.    Past Medical History:  Diagnosis Date   Elevated serum creatinine    GERD (gastroesophageal reflux disease)    Group A streptococcal infection    Hormone disorder    Low TSH level 2021   Pericardial effusion    Secondary cardiomyopathy ([place])    Toxic shock ([place])    Toxic shock ([place])     Past Surgical History:  Procedure Laterality Date   ANTERIOR CERVICAL DECOMP/DISCECTOMY FUSION  11/18/2009   C5-7   CARPAL TUNNEL RELEASE Left    CESAREAN SECTION  2008   complete placenta pervia, hemmorage   CHEILECTOMY Left 05/01/2013    Procedure: LEFT GREAT TOE HALLUX RIGIDUS WITH CHEILECTOMY 1ST METATARSOPHALANGEAL;  Surgeon: Ninetta Lights, MD;  Location: [facility];  Service: Orthopedics;  Laterality: Left;   CHEILECTOMY Right 06/02/2013   procedule:right great toe chielectomy   HYSTEROSCOPY  10/2017   done by Dr. Leo Grosser    I & D EXTREMITY Right 07/02/2013   Procedure: [place];  Surgeon: Ninetta Lights, MD;  Location: [facility];  Service: Orthopedics;  Laterality: Right;   KNEE SURGERY  [phone]   7 surgeries left knee(2 lateral releases, 1 tibial tubercle oxtomy, 1 patellectomy)   TOE FUSION     ulna nerve release Left 12/12/2021    Family History  Problem Relation Age of Onset   Hypertension Father    Alzheimer's disease Father    Hypertension Mother    Hyperlipidemia Mother    Alzheimer's disease Mother    Migraines Sister    Kidney disease Maternal Grandmother    Heart attack Maternal Grandfather 63   Breast cancer Neg Hx     Social History   Socioeconomic History   Marital status: Married    Spouse name: Rodman Key   Number of children: 4   Years of education: Not on file   Highest education level: Not  on file  Occupational History   Occupation: stay at home mom    Employer: UNEMPLOYED  Tobacco Use   Smoking status: Former    Types: Cigarettes    Quit date: 11/03/1994    Years since quitting: 27.8   Smokeless tobacco: Never  Vaping Use   Vaping Use: Never used  Substance and Sexual Activity   Alcohol use: Yes    Comment: 2 drinks/month   Drug use: No   Sexual activity: Yes    Birth control/protection: Post-menopausal  Other Topics Concern   Not on file  Social History Narrative   Exercise 2 times weekly      Diet : Fruit, veggies, water   Social Determinants of Health   Financial Resource Strain: Not on file  Food Insecurity: Not on file  Transportation Needs: Not on file  Physical Activity: Not on file  Stress: Not  on file  Social Connections: Not on file  Intimate Partner Violence: Not on file    ROS      Objective    BP 120/84 (BP Location: Left Arm, Patient Position: Sitting, Cuff Size: Large)   Pulse 95   Temp 97.8 F (36.6 C) (Temporal)   Ht 5\' 5"  (1.651 m)   Wt 170 lb (77.1 kg)   LMP 12/16/2013 (Exact Date)   SpO2 99%   BMI 28.29 kg/m   Physical Exam Constitutional:      General: She is not in acute distress.    Appearance: She is not ill-appearing.  Eyes:     Conjunctiva/sclera: Conjunctivae normal.  Cardiovascular:     Rate and Rhythm: Normal rate and regular rhythm.  Pulmonary:     Effort: Pulmonary effort is normal.     Breath sounds: Normal breath sounds.  Musculoskeletal:     Cervical back: Normal range of motion and neck supple.     Right lower leg: No edema.     Left lower leg: No edema.  Skin:    General: Skin is warm and dry.  Neurological:     General: No focal deficit present.     Mental Status: She is alert and oriented to person, place, and time.  Psychiatric:        Mood and Affect: Mood normal.        Behavior: Behavior normal.        Thought Content: Thought content normal.         Assessment & Plan:   Problem List Items Addressed This Visit   None Visit Diagnoses     Pure hypercholesterolemia    -  Primary   Relevant Orders   Lipid panel   Vitamin D deficiency       Relevant Orders   VITAMIN D 25 Hydroxy (Vit-D Deficiency, Fractures)   Fatigue, unspecified type       Relevant Orders   CBC with Differential/Platelet   Comprehensive metabolic panel   TSH   Vitamin B12   T4, free   Encounter to establish care          She is a pleasant 52 year old female who is new to the practice and here to establish care.  Reviewed labs with patient.  Reviewed risk factors for heart disease and stroke.  She will return for fasting labs.  Will also check labs to look for underlying etiology for fatigue.  Return for pending labs.   Harland Dingwall, NP-C

## 2022-09-20 NOTE — Patient Instructions (Signed)
Thank you for trusting [place] with your health care.  Please schedule a lab visit to return fasting (nothing to eat or drink for at least 6 to 8 hours except water)  We will be in touch with your results and recommendations.

## 2022-09-26 LAB — CBC WITH DIFFERENTIAL/PLATELET
Basophils Absolute: 0.1 10*3/uL (ref 0.0–0.1)
Basophils Relative: 1.1 % (ref 0.0–3.0)
Eosinophils Absolute: 0.1 10*3/uL (ref 0.0–0.7)
Eosinophils Relative: 2.2 % (ref 0.0–5.0)
HCT: 41.7 % (ref 36.0–46.0)
Hemoglobin: 14.1 g/dL (ref 12.0–15.0)
Lymphocytes Relative: 46 % (ref 12.0–46.0)
Lymphs Abs: 2.7 10*3/uL (ref 0.7–4.0)
MCHC: 33.8 g/dL (ref 30.0–36.0)
MCV: 88.4 fl (ref 78.0–100.0)
Monocytes Absolute: 0.4 10*3/uL (ref 0.1–1.0)
Monocytes Relative: 7.3 % (ref 3.0–12.0)
Neutro Abs: 2.6 10*3/uL (ref 1.4–7.7)
Neutrophils Relative %: 43.4 % (ref 43.0–77.0)
Platelets: 276 10*3/uL (ref 150.0–400.0)
RBC: 4.71 Mil/uL (ref 3.87–5.11)
RDW: 12.9 % (ref 11.5–15.5)
WBC: 5.9 10*3/uL (ref 4.0–10.5)

## 2022-09-26 LAB — COMPREHENSIVE METABOLIC PANEL
ALT: 12 U/L (ref 0–35)
AST: 17 U/L (ref 0–37)
Albumin: 4.2 g/dL (ref 3.5–5.2)
Alkaline Phosphatase: 51 U/L (ref 39–117)
BUN: 18 mg/dL (ref 6–23)
CO2: 29 mEq/L (ref 19–32)
Calcium: 9.2 mg/dL (ref 8.4–10.5)
Chloride: 100 mEq/L (ref 96–112)
Creatinine, Ser: 1.03 mg/dL (ref 0.40–1.20)
GFR: 63.33 mL/min (ref >60.00)
Glucose, Bld: 95 mg/dL (ref 70–99)
Potassium: 4 mEq/L (ref 3.5–5.1)
Sodium: 136 mEq/L (ref 135–145)
Total Bilirubin: 0.8 mg/dL (ref 0.2–1.2)
Total Protein: 6.4 g/dL (ref 6.0–8.3)

## 2022-09-26 LAB — LIPID PANEL
Cholesterol: 229 mg/dL — ABNORMAL HIGH (ref 0–200)
HDL: 74.3 mg/dL (ref >39.00)
LDL Cholesterol: 129 mg/dL — ABNORMAL HIGH (ref 0–99)
NonHDL: 155.11
Total CHOL/HDL Ratio: 3
Triglycerides: 130 mg/dL (ref 0.0–149.0)
VLDL: 26 mg/dL (ref 0.0–40.0)

## 2022-09-26 LAB — VITAMIN B12: Vitamin B-12: 436 pg/mL (ref 211–911)

## 2022-09-26 LAB — VITAMIN D 25 HYDROXY (VIT D DEFICIENCY, FRACTURES): VITD: 32.18 ng/mL (ref 30.00–100.00)

## 2022-09-26 LAB — TSH: TSH: 3.65 u[IU]/mL (ref 0.35–5.50)

## 2022-09-26 LAB — T4, FREE: Free T4: 0.86 ng/dL (ref 0.60–1.60)

## 2022-10-02 DIAGNOSIS — M5412 Radiculopathy, cervical region: Secondary | ICD-10-CM | POA: Diagnosis not present

## 2022-10-12 ENCOUNTER — Ambulatory Visit
Admission: RE | Admit: 2022-10-12 | Discharge: 2022-10-12 | Disposition: A | Discharge status: Home / Self Care | Payer: BC Managed Care – PPO | Source: Ambulatory Visit

## 2022-10-12 DIAGNOSIS — Z1231 Encounter for screening mammogram for malignant neoplasm of breast: Secondary | ICD-10-CM | POA: Diagnosis not present

## 2022-10-24 ENCOUNTER — Encounter: Payer: Self-pay | Admitting: Obstetrics and Gynecology

## 2022-10-25 ENCOUNTER — Other Ambulatory Visit: Payer: Self-pay | Admitting: Obstetrics and Gynecology

## 2022-10-25 DIAGNOSIS — Z79890 Hormone replacement therapy (postmenopausal): Secondary | ICD-10-CM

## 2022-10-25 MED ORDER — ESTRADIOL 1 MG PO TABS: ORAL_TABLET | ORAL | 0 refills | Status: DC

## 2022-10-25 MED ORDER — PROGESTERONE MICRONIZED 100 MG PO CAPS: 100.0000 mg | ORAL_CAPSULE | Freq: Every day | ORAL | 0 refills | Status: DC

## 2022-10-25 NOTE — Progress Notes (Signed)
New dosage of Prometrium 100 mg q hs.

## 2022-11-10 ENCOUNTER — Other Ambulatory Visit: Payer: Self-pay | Admitting: Obstetrics and Gynecology

## 2022-11-13 DIAGNOSIS — H1031 Unspecified acute conjunctivitis, right eye: Secondary | ICD-10-CM | POA: Diagnosis not present

## 2022-11-13 NOTE — Telephone Encounter (Signed)
Med refill request: vagifem Last AEX: 02/02/22 Next AEX: not scheduled Last MMG (if hormonal med) 10/12/22 BI-RADS CAT 1 neg Refill authorized: Please Advise?

## 2022-11-14 DIAGNOSIS — H00031 Abscess of right upper eyelid: Secondary | ICD-10-CM | POA: Diagnosis not present

## 2022-11-17 DIAGNOSIS — H1033 Unspecified acute conjunctivitis, bilateral: Secondary | ICD-10-CM | POA: Diagnosis not present

## 2023-01-02 NOTE — Progress Notes (Signed)
GYNECOLOGY  VISIT   HPI: 52 y.o.   Married  Caucasian  female   G51P0 with Patient's last menstrual period was 12/16/2013 (exact date).   here for   HRT consultation.   Patient tried to reduce her dosages in half and she had resumption of her usual dosage.   She is taking Estrace 2 mg daily and Prometrium 100 mg at night for the last 3 weeks. She has a lot of the Prometrium 100 mg capsules on hand.  She also has Estrace at home.    Notes decreased libido.   She took testosterone in the past, maybe about 2 years ago.  Her libido has decreased and so has her energy since stopping the testosterone.   Not using Vagifem regularly.  Would consider using Estring again.   GYNECOLOGIC HISTORY: Patient's last menstrual period was 12/16/2013 (exact date). Contraception:  PMP Menopausal hormone therapy:  estrace and vagifem Last mammogram:  10/12/22 Breast Density Cat D, BI-RADS CAT 1 neg Last pap smear:   08/2017 at [facility]--normal per patient         OB History     Gravida  4   Para      Term      Preterm      AB      Living  4      SAB      IAB      Ectopic      Multiple      Live Births                 Patient Active Problem List   Diagnosis Date Noted   Carpal tunnel syndrome 09/20/2022   Insomnia 09/20/2022   Recurrent infections 11/03/2013   Lung mass 07/31/2013   Toxic shock ([facility]) 07/17/2013   Group A streptococcal infection    Pericardial effusion    SIRS (systemic inflammatory response syndrome) (HCC) 07/01/2013   Right foot infection 07/01/2013   Hypotension 07/01/2013   Tachycardia 07/01/2013   Septic shock (HCC) 07/01/2013   Tinea versicolor 11/07/2010   Radiculopathy of cervical spine 11/07/2010   OTHER MALAISE AND FATIGUE 09/01/2010   MITRAL VALVE PROLAPSE 10/07/2009   MIGRAINE WITH AURA 02/20/2008    Past Medical History:  Diagnosis Date   Elevated serum creatinine    GERD (gastroesophageal reflux disease)    Group A streptococcal  infection    Hormone disorder    Low TSH level 2021   Pericardial effusion    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 Pennie Rushing    I & D EXTREMITY Right 07/02/2013   Procedure: IRRIGATION AND DEBRIDEMENT RIGHT 1ST Metaphalangeal JOINT;  Surgeon: Loreta Ave, MD;  Location: [facility];  Service: Orthopedics;  Laterality: Right;   KNEE SURGERY  [phone]   7 surgeries left knee(2 lateral releases, 1 tibial tubercle oxtomy, 1 patellectomy)   TOE  FUSION     ulna nerve release Left 12/12/2021    Current Outpatient Medications  Medication Sig Dispense Refill   buPROPion (WELLBUTRIN XL) 300 MG 24 hr tablet 1 tablet in the morning 90 tablet 3   estradiol (ESTRACE) 1 MG tablet Take one tablet (1 mg) by mouth daily. 90 tablet 0   Estradiol (VAGIFEM) 10 MCG TABS vaginal tablet Place 1 tablet (10 mcg total) vaginally 2 (two) times a week. 24 tablet 0   Multiple Vitamins-Minerals (MULTIVITAMIN ADULT PO) Take 1 tablet by mouth daily.     progesterone (PROMETRIUM) 100 MG capsule Take 1 capsule (100 mg total) by mouth daily. Take at bedtime. 90 capsule 0   No current facility-administered medications for this visit.     ALLERGIES: Codeine, Dilaudid [hydromorphone hcl], and Hydromorphone hcl  Family History  Problem Relation Age of Onset   Hypertension Father    Alzheimer's disease Father    Hypertension Mother     Hyperlipidemia Mother    Alzheimer's disease Mother    Migraines Sister    Kidney disease Maternal Grandmother    Heart attack Maternal Grandfather 98   Breast cancer Neg Hx     Social History   Socioeconomic History   Marital status: Married    Spouse name: Molli Hazard   Number of children: 4   Years of education: Not on file   Highest education level: Not on file  Occupational History   Occupation: stay at home mom    Employer: UNEMPLOYED  Tobacco Use   Smoking status: Former    Types: Cigarettes    Quit date: 11/03/1994    Years since quitting: 28.2   Smokeless tobacco: Never  Vaping Use   Vaping Use: Never used  Substance and Sexual Activity   Alcohol use: Yes    Comment: 2 drinks/month   Drug use: No   Sexual activity: Yes    Birth control/protection: Post-menopausal  Other Topics Concern   Not on file  Social History Narrative   Exercise 2 times weekly      Diet : Fruit, veggies, water   Social Determinants of Health   Financial Resource Strain: Not on file  Food Insecurity: Not on file  Transportation Needs: Not on file  Physical Activity: Not on file  Stress: Not on file  Social Connections: Not on file  Intimate Partner Violence: Not on file    Review of Systems  All other systems reviewed and are negative.   PHYSICAL EXAMINATION:    BP 124/82 (BP Location: Right Arm, Patient Position: Sitting, Cuff Size: Normal)   Ht 5\' 5"  (1.651 m)   Wt 172 lb (78 kg)   LMP 12/16/2013 (Exact Date)   BMI 28.62 kg/m     General appearance: alert, cooperative and appears stated age   ASSESSMENT  Hormone therapy.  Decreased libido.  On Wellbutrin XL.  PLAN  She will increased her Prometrium to 200 mg nightly.  She does not need a refill.  She will continue her Estrace 2 mg.  Does not need refill.  Estring pv x 90 days.  RF zero. Refill of Wellbutrin XL 300 mg x 90 days.  Will check Testosterone levels.  Then will prescribe testosterone and send to [facility].  We discussed this is a non FDA approved medication for decreased libido.  Potential side effects discussed and the need for blood work to follow levels.   Return for annual exam in 2 - 3 months.  24 min  total time was spent for this patient encounter, including preparation, face-to-face counseling with the patient, coordination of care, and documentation of the encounter.

## 2023-01-16 ENCOUNTER — Encounter: Payer: Self-pay | Admitting: Obstetrics and Gynecology

## 2023-01-16 ENCOUNTER — Ambulatory Visit: Payer: BC Managed Care – PPO | Admitting: Obstetrics and Gynecology

## 2023-01-16 VITALS — BP 124/82 | Ht 65.0 in | Wt 172.0 lb

## 2023-01-16 DIAGNOSIS — Z79890 Hormone replacement therapy (postmenopausal): Secondary | ICD-10-CM | POA: Diagnosis not present

## 2023-01-16 DIAGNOSIS — R6882 Decreased libido: Secondary | ICD-10-CM | POA: Diagnosis not present

## 2023-01-16 DIAGNOSIS — E2839 Other primary ovarian failure: Secondary | ICD-10-CM | POA: Diagnosis not present

## 2023-01-16 MED ORDER — BUPROPION HCL ER (XL) 300 MG PO TB24: ORAL_TABLET | ORAL | 0 refills | Status: DC

## 2023-01-16 MED ORDER — ESTRADIOL 7.5 MCG/24HR VA RING: 1.0000 | VAGINAL_RING | VAGINAL | 0 refills | Status: DC

## 2023-01-16 MED ORDER — PROGESTERONE 200 MG PO CAPS: 200.0000 mg | ORAL_CAPSULE | Freq: Every day | ORAL | 0 refills | Status: DC

## 2023-01-17 ENCOUNTER — Other Ambulatory Visit: Payer: Self-pay | Admitting: Obstetrics and Gynecology

## 2023-01-19 ENCOUNTER — Other Ambulatory Visit: Payer: Self-pay | Admitting: Obstetrics and Gynecology

## 2023-01-19 ENCOUNTER — Encounter: Payer: Self-pay | Admitting: Obstetrics and Gynecology

## 2023-01-19 LAB — TESTOS,TOTAL,FREE AND SHBG (FEMALE)
Free Testosterone: 0.9 pg/mL (ref 0.1–6.4)
Sex Hormone Binding: 167.6 nmol/L — ABNORMAL HIGH (ref 17–124)
Testosterone, Total, LC-MS-MS: 20 ng/dL (ref 2–45)

## 2023-01-23 ENCOUNTER — Other Ambulatory Visit: Payer: Self-pay | Admitting: Obstetrics and Gynecology

## 2023-01-23 MED ORDER — NONFORMULARY OR COMPOUNDED ITEM: 0 refills | Status: DC

## 2023-01-23 NOTE — Progress Notes (Signed)
Rx for testosterone cream to be faxed to [facility].

## 2023-02-05 ENCOUNTER — Encounter: Payer: Self-pay | Admitting: Obstetrics and Gynecology

## 2023-02-05 DIAGNOSIS — Z79890 Hormone replacement therapy (postmenopausal): Secondary | ICD-10-CM

## 2023-02-05 NOTE — Telephone Encounter (Signed)
1 estring sent by BS on 01/16/2023-pt did not pick up d/t costs. [facility].   Unable to find pharmacy in EMR. Will have to print rx and fax manually.   Rx pend.

## 2023-02-06 MED ORDER — ESTRADIOL 7.5 MCG/24HR VA RING
1.0000 | VAGINAL_RING | VAGINAL | 0 refills | Status: DC
Start: 2023-02-06 — End: 2023-05-21

## 2023-02-16 DIAGNOSIS — M25512 Pain in left shoulder: Secondary | ICD-10-CM | POA: Insufficient documentation

## 2023-02-21 ENCOUNTER — Other Ambulatory Visit: Payer: Self-pay

## 2023-02-21 DIAGNOSIS — Z79890 Hormone replacement therapy (postmenopausal): Secondary | ICD-10-CM

## 2023-02-21 NOTE — Telephone Encounter (Signed)
Medication refill request: estradiol tab Last AEX:  02/02/22 Next AEX: 04/10/23 Last MMG (if hormonal medication request): 10/12/22 Bi-rads 1 neg  Refill authorized: #90 with 0 rf pended for today

## 2023-02-22 MED ORDER — ESTRADIOL 1 MG PO TABS: ORAL_TABLET | ORAL | 0 refills | Status: DC

## 2023-03-13 ENCOUNTER — Encounter: Payer: Self-pay | Admitting: Obstetrics and Gynecology

## 2023-03-13 DIAGNOSIS — R6882 Decreased libido: Secondary | ICD-10-CM

## 2023-03-13 NOTE — Telephone Encounter (Signed)
Rx request received yesterday.   Med refill request: Testosterone 1% cream Last AEX: 02/02/2022 Next AEX: 04/10/2023 Last MMG (if hormonal med) 10/12/2022- neg birads 1 Refill authorized: rx pend.  Last testosterone level checked: 01/16/2023.

## 2023-03-13 NOTE — Telephone Encounter (Signed)
Shelly [name] from [facility] reaching out about rx request. Stating that she keeps receiving refusal.   Shelly Batten advised that yes there is a initial refusal because our system will not let us send compounded rx via electronically. So, we have to initially refuse, then route to provider another way. Once response is received, we will contact pharmacy back via phone/verbal. Shelly [name] voiced understanding.

## 2023-03-14 DIAGNOSIS — M7918 Myalgia, other site: Secondary | ICD-10-CM | POA: Diagnosis not present

## 2023-03-14 DIAGNOSIS — M25512 Pain in left shoulder: Secondary | ICD-10-CM | POA: Diagnosis not present

## 2023-03-14 MED ORDER — NONFORMULARY OR COMPOUNDED ITEM: 0 refills | Status: DC

## 2023-03-14 NOTE — Telephone Encounter (Signed)
Pt LVM in triage line stating that she is calling back to check on refill request status due to pharmacy calling her to get an update.   Pt advised that requests have been received and sent to Dr. Edward Jolly for review and authorization. Pt voiced understanding/appreciation for call/update.

## 2023-03-16 NOTE — Telephone Encounter (Signed)
Pt LVM in triage line inquiring about the status of refill.   CB, no answer, LDVM on machine per DPR advising pt that pharmacy had rx and reported that they were getting it ready for her earlier today.

## 2023-03-16 NOTE — Telephone Encounter (Signed)
Per [facility], they received rx on 03/14/2023.

## 2023-03-27 ENCOUNTER — Other Ambulatory Visit: Payer: Self-pay | Admitting: Obstetrics and Gynecology

## 2023-03-27 NOTE — Progress Notes (Deleted)
52 y.o. G74P0 Married Caucasian female here for annual exam.    PCP:     Patient's last menstrual period was 12/16/2013 (exact date).           Sexually active: {yes no:314532}  The current method of family planning is post menopausal status.    Exercising: {yes no:314532}  {types:19826} Smoker:  former  Health Maintenance: Pap:  08/2017 at [facility]--normal per patient  History of abnormal Pap:  no MMG:  10/12/22 Breast Density Cat D, BI-RADS CAT 1 neg  Colonoscopy:  n/a BMD:   2018  Result  normal per pt TDaP:  09/23/19 Gardasil:   no HIV: 07/03/13 NR Hep C: 07/03/13 neg Screening Labs:  Hb today: ***, Urine today: ***   reports that she quit smoking about 28 years ago. Her smoking use included cigarettes. She has never used smokeless tobacco. She reports current alcohol use. She reports that she does not use drugs.  Past Medical History:  Diagnosis Date   Elevated serum creatinine    GERD (gastroesophageal reflux disease)    Group A streptococcal infection    Hormone disorder    Low TSH level 2021   Pericardial effusion    Secondary cardiomyopathy (HCC)    Toxic shock (HCC)    Toxic shock (HCC)     Past Surgical History:  Procedure Laterality Date   ANTERIOR CERVICAL DECOMP/DISCECTOMY FUSION  11/18/2009   C5-7   CARPAL TUNNEL RELEASE Left    CESAREAN SECTION  2008   complete placenta pervia, hemmorage   CHEILECTOMY Left 05/01/2013   Procedure: LEFT GREAT TOE HALLUX RIGIDUS WITH CHEILECTOMY 1ST METATARSOPHALANGEAL;  Surgeon: Loreta Ave, MD;  Location: [facility];  Service: Orthopedics;  Laterality: Left;   CHEILECTOMY Right 06/02/2013   procedule:right great toe chielectomy   HYSTEROSCOPY  10/2017   done by Dr. Pennie Rushing    I & D EXTREMITY Right 07/02/2013   Procedure: IRRIGATION AND DEBRIDEMENT RIGHT 1ST Metaphalangeal JOINT;  Surgeon: Loreta Ave, MD;  Location: [facility];  Service: Orthopedics;  Laterality: Right;   KNEE SURGERY  [phone]   7  surgeries left knee(2 lateral releases, 1 tibial tubercle oxtomy, 1 patellectomy)   TOE FUSION     ulna nerve release Left 12/12/2021    Current Outpatient Medications  Medication Sig Dispense Refill   buPROPion (WELLBUTRIN XL) 300 MG 24 hr tablet 1 tablet in the morning 90 tablet 0   estradiol (ESTRACE) 1 MG tablet Take two tablets (2 mg) by mouth daily. 120 tablet 0   estradiol (ESTRING) 7.5 MCG/24HR vaginal ring Place 1 each vaginally every 3 (three) months. 1 each 0   Multiple Vitamins-Minerals (MULTIVITAMIN ADULT PO) Take 1 tablet by mouth daily.     NONFORMULARY OR COMPOUNDED ITEM Testosterone cream 1%.  Apply 0.5 mg to skin of arm, thigh, or abdomen daily.  Rotate site. 30 each 0   progesterone (PROMETRIUM) 200 MG capsule Take 1 capsule (200 mg total) by mouth daily. Take at bedtime. 90 capsule 0   No current facility-administered medications for this visit.    Family History  Problem Relation Age of Onset   Hypertension Father    Alzheimer's disease Father    Hypertension Mother    Hyperlipidemia Mother    Alzheimer's disease Mother    Migraines Sister    Kidney disease Maternal Grandmother    Heart attack Maternal Grandfather 35   Breast cancer Neg Hx     Review of Systems  Exam:  LMP 12/16/2013 (Exact Date)     General appearance: alert, cooperative and appears stated age Head: normocephalic, without obvious abnormality, atraumatic Neck: no adenopathy, supple, symmetrical, trachea midline and thyroid normal to inspection and palpation Lungs: clear to auscultation bilaterally Breasts: normal appearance, no masses or tenderness, No nipple retraction or dimpling, No nipple discharge or bleeding, No axillary adenopathy Heart: regular rate and rhythm Abdomen: soft, non-tender; no masses, no organomegaly Extremities: extremities normal, atraumatic, no cyanosis or edema Skin: skin color, texture, turgor normal. No rashes or lesions Lymph nodes: cervical,  supraclavicular, and axillary nodes normal. Neurologic: grossly normal  Pelvic: External genitalia:  no lesions              No abnormal inguinal nodes palpated.              Urethra:  normal appearing urethra with no masses, tenderness or lesions              Bartholins and Skenes: normal                 Vagina: normal appearing vagina with normal color and discharge, no lesions              Cervix: no lesions              Pap taken: {yes no:314532} Bimanual Exam:  Uterus:  normal size, contour, position, consistency, mobility, non-tender              Adnexa: no mass, fullness, tenderness              Rectal exam: {yes no:314532}.  Confirms.              Anus:  normal sphincter tone, no lesions  Chaperone was present for exam:  ***  Assessment:   Well woman visit with gynecologic exam.   Plan: Mammogram screening discussed. Self breast awareness reviewed. Pap and HR HPV as above. Guidelines for Calcium, Vitamin D, regular exercise program including cardiovascular and weight bearing exercise.   Follow up annually and prn.   Additional counseling given.  {yes [id number]. _______ minutes face to face time of which over 50% was spent in counseling.    After visit summary provided.

## 2023-03-27 NOTE — Telephone Encounter (Signed)
Med refill request:Wellbutrin XL 300 mg  Last AEX: 02/02/22 -BS Next AEX: 04/10/23 -BS Last MMG (if hormonal med) N/A  Last Rx sent on 01/16/23, #90/0RF Rx refused.    Routing to Dr. Edward Jolly  Encounter closed.

## 2023-04-06 ENCOUNTER — Telehealth: Payer: Self-pay

## 2023-04-06 NOTE — Telephone Encounter (Signed)
Refill request came in for estring vaginal ring. Ring was last refilled 02-06-23. Patient has aex 04-10-23. The ring is good for 3 months. She can get a new rx when she comes in for her aex.

## 2023-04-10 ENCOUNTER — Ambulatory Visit: Payer: BC Managed Care – PPO | Admitting: Obstetrics and Gynecology

## 2023-04-10 NOTE — Telephone Encounter (Signed)
No callback from patient. She has an appt today. She can get rx at her appt.

## 2023-04-20 ENCOUNTER — Encounter: Payer: Self-pay | Admitting: Obstetrics and Gynecology

## 2023-04-20 ENCOUNTER — Other Ambulatory Visit: Payer: Self-pay | Admitting: Obstetrics and Gynecology

## 2023-04-23 MED ORDER — BUPROPION HCL ER (XL) 300 MG PO TB24: ORAL_TABLET | ORAL | 1 refills | Status: DC

## 2023-04-23 NOTE — Telephone Encounter (Signed)
Med refill request:Wellbutrin XL 300 mg PO daily Last AEX: 02/02/22- BS Next AEX: 08/22/23 -BS Last MMG (if hormonal med) N/A  Rx pended #90/1RF -Needs AEX for future refills.  Refill authorized: Please Advise?

## 2023-04-23 NOTE — Telephone Encounter (Signed)
See refill encounter dated 04/20/23.

## 2023-04-25 ENCOUNTER — Other Ambulatory Visit: Payer: Self-pay

## 2023-04-25 MED ORDER — PROGESTERONE 200 MG PO CAPS: 200.0000 mg | ORAL_CAPSULE | Freq: Every day | ORAL | 1 refills | Status: DC

## 2023-04-25 NOTE — Telephone Encounter (Signed)
Med refill request: Progesterone 200mg  Last AEX: 02/02/2022 Next AEX: 08/22/2023 Last MMG (if hormonal med): 10/12/2022 Refill authorized: rx pend.  Per rx request, last filled on 10/25/2022.  Last seen 01/16/2023  Last rx placed in EMR in 01/2023 for #90 w/ sero refills. However, rx did not get sent due to order class showing "no print" and in comment section it stated "no refill was needed at this time."

## 2023-04-30 DIAGNOSIS — M5451 Vertebrogenic low back pain: Secondary | ICD-10-CM | POA: Diagnosis not present

## 2023-05-17 ENCOUNTER — Encounter: Payer: Self-pay | Admitting: Obstetrics and Gynecology

## 2023-05-18 ENCOUNTER — Other Ambulatory Visit: Payer: Self-pay | Admitting: Family Medicine

## 2023-05-18 DIAGNOSIS — Z1212 Encounter for screening for malignant neoplasm of rectum: Secondary | ICD-10-CM

## 2023-05-18 DIAGNOSIS — Z1211 Encounter for screening for malignant neoplasm of colon: Secondary | ICD-10-CM

## 2023-05-21 ENCOUNTER — Other Ambulatory Visit: Payer: Self-pay

## 2023-05-21 ENCOUNTER — Other Ambulatory Visit: Payer: Self-pay | Admitting: Obstetrics and Gynecology

## 2023-05-21 DIAGNOSIS — Z79890 Hormone replacement therapy (postmenopausal): Secondary | ICD-10-CM

## 2023-05-21 MED ORDER — ESTRADIOL ACETATE 0.1 MG/24HR VA RING: 1.0000 | VAGINAL_RING | VAGINAL | 0 refills | Status: DC

## 2023-05-21 NOTE — Telephone Encounter (Signed)
Can provide printed and signed Rx to patient to use at pharmacy of her choice.

## 2023-05-21 NOTE — Progress Notes (Signed)
Switch from oral estradiol and Estring to Femring 0.1 mg to vagina every 3 months.   Disp:  1 RF:  none

## 2023-05-21 NOTE — Telephone Encounter (Signed)
Med refill request: Estring Last AEX: 02/02/2022-BS Next AEX: 08/22/2023-BS Last MMG (if hormonal med): 10/12/2022-neg birads 1, Cat D Refill authorized: rx pend.

## 2023-05-21 NOTE — Telephone Encounter (Signed)
New prescription given for Femring.  See My Chart message from today.

## 2023-05-23 NOTE — Telephone Encounter (Signed)
Rx sent in another encounter. Closing this one.

## 2023-05-23 NOTE — Telephone Encounter (Signed)
Routing to [place] [facility] to assist with PA and/or printed Rx.

## 2023-05-25 ENCOUNTER — Other Ambulatory Visit: Payer: Self-pay | Admitting: Obstetrics and Gynecology

## 2023-05-25 NOTE — Telephone Encounter (Signed)
Routing to [place] to assist with PA, if required.

## 2023-05-25 NOTE — Telephone Encounter (Signed)
Dr. Edward Jolly -please advise on Femring RX.

## 2023-05-29 DIAGNOSIS — M791 Myalgia, unspecified site: Secondary | ICD-10-CM | POA: Insufficient documentation

## 2023-05-31 NOTE — Telephone Encounter (Signed)
Irving Burton -Did you receive a PA? Please provide update to patient.

## 2023-06-05 ENCOUNTER — Other Ambulatory Visit: Payer: Self-pay | Admitting: Obstetrics and Gynecology

## 2023-06-05 MED ORDER — ESTRADIOL ACETATE 0.1 MG/24HR VA RING: 1.0000 | VAGINAL_RING | VAGINAL | 0 refills | Status: DC

## 2023-06-05 NOTE — Telephone Encounter (Signed)
Initiated PA through [employer] for Femring so that pt can potentially receive with Ambulance person.   In the meantime, can you place an order for one femring by print out and sign so that I can fax to [facility] per pt's request?

## 2023-06-05 NOTE — Progress Notes (Signed)
Rx printed for Femring.

## 2023-06-06 NOTE — Telephone Encounter (Signed)
The pt's PA for femring received a denial response. Will leave on your desk for review.   Tried medications: Estrace Cream Vagifem Estradiol Patches  Currently taking: Estradiol Oral and progesterone w/ additional ring added to regimen.   Please advise if you would like to initiate an appeal.

## 2023-06-07 NOTE — Telephone Encounter (Signed)
Appeal letter typed up and placed on Dr. Rica Records desk for review.

## 2023-06-08 NOTE — Telephone Encounter (Signed)
VM received from Pharmacist w/ [facility] stating that they need verification of the dx sent for the femring medication.  Dx submitted were for: HRT: Z79.890 Decreased Libido: R68.82  However, they need assurance that pt is PM and the ICD-10/Dx code for that.  Ok to provide also dx code of N95.9 (PM, unspecified)?

## 2023-06-12 ENCOUNTER — Other Ambulatory Visit: Payer: Self-pay

## 2023-06-12 DIAGNOSIS — R6882 Decreased libido: Secondary | ICD-10-CM

## 2023-06-12 NOTE — Telephone Encounter (Signed)
Medication refill request: estradiol cream  Last OV:  01/16/23 Next AEX: not scheduled  Last MMG (if hormonal medication request): 10/16/22 Bi-rads 1 neg  Refill authorized: #30g with 0 rf pended

## 2023-06-12 NOTE — Telephone Encounter (Signed)
Received pt's PA approval from [facility] for pt's femring rx.   Will notify pt via mychart msg.   Routing to provider for review.

## 2023-06-13 ENCOUNTER — Other Ambulatory Visit: Payer: Self-pay | Admitting: Obstetrics and Gynecology

## 2023-06-13 DIAGNOSIS — Z5181 Encounter for therapeutic drug level monitoring: Secondary | ICD-10-CM

## 2023-06-13 NOTE — Progress Notes (Signed)
Order placed for check of testosterone levels for medication monitoring.

## 2023-06-14 ENCOUNTER — Encounter: Payer: Self-pay | Admitting: Obstetrics and Gynecology

## 2023-06-18 ENCOUNTER — Other Ambulatory Visit: Payer: Self-pay | Admitting: Obstetrics and Gynecology

## 2023-06-18 DIAGNOSIS — R6882 Decreased libido: Secondary | ICD-10-CM

## 2023-06-18 MED ORDER — NONFORMULARY OR COMPOUNDED ITEM: 0 refills | Status: DC

## 2023-06-18 NOTE — Progress Notes (Signed)
Refill of testosterone cream.

## 2023-06-25 NOTE — Telephone Encounter (Signed)
Call placed to patient, left detailed message, ok per dpr. Advised per Dr. Edward Jolly. Return call to office to schedule lab appt, [phone], option 1.   Lab order placed by Dr. Edward Jolly.   Encounter closed.

## 2023-08-14 NOTE — Progress Notes (Deleted)
 52 y.o. G10P0 Married Caucasian female here for annual exam.    PCP: Lendia Boby CROME, NP-C   Patient's last menstrual period was 12/16/2013 (exact date).           Sexually active: Yes.    The current method of family planning is post menopausal status.    Menopausal hormone therapy:  estradiol  ring, progesterone ,  testosterone  Exercising: {yes no:314532}  {types:19826} Smoker:  former  OB History  Gravida Para Term Preterm AB Living  4     4  SAB IAB Ectopic Multiple Live Births          # Outcome Date GA Lbr Len/2nd Weight Sex Type Anes PTL Lv  4 Gravida 06/12/07    M CS-LTranv     3 Gravida 07/30/04    F Vag-Spont     2 Gravida 11/11/01    M Vag-Spont     1 Gravida 05/10/99    M Vag-Spont        HEALTH MAINTENANCE: Last 2 paps:    08/2017 at [facility]--normal per patient ,05/14/10 normal, 10/04/06 normal History of abnormal Pap or positive HPV:  no Mammogram:   10/12/22 Breast Density cat D, BI-RADS CAT 1 neg Colonoscopy: n/a  Bone Density:  2018 Result  normal per pt  Immunization History  Administered Date(s) Administered   Influenza-Unspecified 07/24/2013   Td 08/15/1999   Tdap 09/23/2019      reports that she quit smoking about 28 years ago. Her smoking use included cigarettes. She has never used smokeless tobacco. She reports current alcohol use. She reports that she does not use drugs.  Past Medical History:  Diagnosis Date   Elevated serum creatinine    GERD (gastroesophageal reflux disease)    Group A streptococcal infection    Hormone disorder    Low TSH level 2021   Pericardial effusion    Secondary cardiomyopathy (HCC)    Toxic shock (HCC)    Toxic shock (HCC)     Past Surgical History:  Procedure Laterality Date   ANTERIOR CERVICAL DECOMP/DISCECTOMY FUSION  11/18/2009   C5-7   CARPAL TUNNEL RELEASE Left    CESAREAN SECTION  2008   complete placenta pervia, hemmorage   CHEILECTOMY Left 05/01/2013   Procedure: LEFT GREAT TOE HALLUX RIGIDUS  WITH CHEILECTOMY 1ST METATARSOPHALANGEAL;  Surgeon: Toribio JULIANNA Chancy, MD;  Location: [facility];  Service: Orthopedics;  Laterality: Left;   CHEILECTOMY Right 06/02/2013   procedule:right great toe chielectomy   HYSTEROSCOPY  10/2017   done by Dr. Raeanne    I & D EXTREMITY Right 07/02/2013   Procedure: IRRIGATION AND DEBRIDEMENT RIGHT 1ST Metaphalangeal JOINT;  Surgeon: Toribio JULIANNA Chancy, MD;  Location: [facility] OR;  Service: Orthopedics;  Laterality: Right;   KNEE SURGERY  [phone]   7 surgeries left knee(2 lateral releases, 1 tibial tubercle oxtomy, 1 patellectomy)   TOE FUSION     ulna nerve release Left 12/12/2021    Current Outpatient Medications  Medication Sig Dispense Refill   buPROPion  (WELLBUTRIN  XL) 300 MG 24 hr tablet 1 tablet in the morning 90 tablet 1   Estradiol  Acetate 0.1 MG/24HR RING Place 1 each vaginally every 3 (three) months. 1 each 0   Multiple Vitamins-Minerals (MULTIVITAMIN ADULT PO) Take 1 tablet by mouth daily.     NONFORMULARY OR COMPOUNDED ITEM Testosterone  cream 1%.  Apply 0.5 mg to skin of arm, thigh, or abdomen daily.  Rotate site.  Dispense:  30 grams.  RF:  none. 30 each 0   progesterone  (PROMETRIUM ) 200 MG capsule Take 1 capsule (200 mg total) by mouth daily. Take at bedtime. 90 capsule 1   No current facility-administered medications for this visit.    ALLERGIES: Codeine, Dilaudid  [hydromorphone  hcl], and Hydromorphone  hcl  Family History  Problem Relation Age of Onset   Hypertension Father    Alzheimer's disease Father    Hypertension Mother    Hyperlipidemia Mother    Alzheimer's disease Mother    Migraines Sister    Kidney disease Maternal Grandmother    Heart attack Maternal Grandfather 38   Breast cancer Neg Hx     Review of Systems  PHYSICAL EXAM:  LMP 12/16/2013 (Exact Date)     General appearance: alert, cooperative and appears stated age Head: normocephalic, without obvious abnormality, atraumatic Neck: no adenopathy,  supple, symmetrical, trachea midline and thyroid  normal to inspection and palpation Lungs: clear to auscultation bilaterally Breasts: normal appearance, no masses or tenderness, No nipple retraction or dimpling, No nipple discharge or bleeding, No axillary adenopathy Heart: regular rate and rhythm Abdomen: soft, non-tender; no masses, no organomegaly Extremities: extremities normal, atraumatic, no cyanosis or edema Skin: skin color, texture, turgor normal. No rashes or lesions Lymph nodes: cervical, supraclavicular, and axillary nodes normal. Neurologic: grossly normal  Pelvic: External genitalia:  no lesions              No abnormal inguinal nodes palpated.              Urethra:  normal appearing urethra with no masses, tenderness or lesions              Bartholins and Skenes: normal                 Vagina: normal appearing vagina with normal color and discharge, no lesions              Cervix: no lesions              Pap taken: {yes no:314532} Bimanual Exam:  Uterus:  normal size, contour, position, consistency, mobility, non-tender              Adnexa: no mass, fullness, tenderness              Rectal exam: {yes no:314532}.  Confirms.              Anus:  normal sphincter tone, no lesions  Chaperone was present for exam:  {BSCHAPERONE:31226::Lambert Jeanty F, CMA}  ASSESSMENT: Well woman visit with gynecologic exam  ***  PLAN: Mammogram screening discussed. Self breast awareness reviewed. Pap and HRV collected:  {yes no:314532} Guidelines for Calcium , Vitamin D , regular exercise program including cardiovascular and weight bearing exercise. Medication refills:  *** {LABS (Optional):23779} Follow up:  ***    Additional counseling given.  {yes [id number]. ***  total time was spent for this patient encounter, including preparation, face-to-face counseling with the patient, coordination of care, and documentation of the encounter in addition to doing the well woman visit with  gynecologic exam.

## 2023-08-22 ENCOUNTER — Ambulatory Visit: Payer: BC Managed Care – PPO | Admitting: Obstetrics and Gynecology

## 2023-08-27 NOTE — Progress Notes (Unsigned)
53 y.o. G30P0 Married Caucasian female here for annual exam.    Using Femring from [facility].  Taking Prometrium 200 mg at hs.  Using testosterone cream 2 clicks once a day. Using [facility].  No hot flashes or night sweats.   Wellbutrin is working well to treat depression.   PCP: Avanell Shackleton, NP-C   Patient's last menstrual period was 12/16/2013 (exact date).           Sexually active: Yes.    The current method of family planning is post menopausal status.    Menopausal hormone therapy:  estradiol ring, progesterone,  testosterone Exercising: Yes.     Strength training, walking and running Smoker:  former  OB History  Gravida Para Term Preterm AB Living  4     4  SAB IAB Ectopic Multiple Live Births          # Outcome Date GA Lbr Len/2nd Weight Sex Type Anes PTL Lv  4 Gravida 06/12/07    M CS-LTranv     3 Gravida 07/30/04    F Vag-Spont     2 Gravida 11/11/01    M Vag-Spont     1 Gravida 05/10/99    M Vag-Spont        HEALTH MAINTENANCE: Last 2 paps:    08/2017 at [facility]--normal per patient ,05/14/10 normal, 10/04/06 normal History of abnormal Pap or positive HPV:  no Mammogram:   10/12/22 Breast Density cat D, BI-RADS CAT 1 neg Colonoscopy: n/a  Bone Density:  2018 Result  normal per pt  Immunization History  Administered Date(s) Administered   Influenza-Unspecified 07/24/2013   Td 08/15/1999   Tdap 09/23/2019      reports that she quit smoking about 28 years ago. Her smoking use included cigarettes. She has never used smokeless tobacco. She reports current alcohol use. She reports that she does not use drugs.  Past Medical History:  Diagnosis Date   Elevated serum creatinine    GERD (gastroesophageal reflux disease)    Group A streptococcal infection    Hormone disorder    Low TSH level 2021   Pericardial effusion    Secondary cardiomyopathy (HCC)    Toxic shock (HCC)    Toxic shock (HCC)     Past Surgical History:  Procedure Laterality Date    ANTERIOR CERVICAL DECOMP/DISCECTOMY FUSION  11/18/2009   C5-7   CARPAL TUNNEL RELEASE Left    CESAREAN SECTION  2008   complete placenta pervia, hemmorage   CHEILECTOMY Left 05/01/2013   Procedure: LEFT GREAT TOE HALLUX RIGIDUS WITH CHEILECTOMY 1ST METATARSOPHALANGEAL;  Surgeon: Loreta Ave, MD;  Location: [facility];  Service: Orthopedics;  Laterality: Left;   CHEILECTOMY Right 06/02/2013   procedule:right great toe chielectomy   HYSTEROSCOPY  10/2017   done by Dr. Pennie Rushing    I & D EXTREMITY Right 07/02/2013   Procedure: IRRIGATION AND DEBRIDEMENT RIGHT 1ST Metaphalangeal JOINT;  Surgeon: Loreta Ave, MD;  Location: [facility] OR;  Service: Orthopedics;  Laterality: Right;   KNEE SURGERY  [phone]   7 surgeries left knee(2 lateral releases, 1 tibial tubercle oxtomy, 1 patellectomy)   TOE FUSION     ulna nerve release Left 12/12/2021    Current Outpatient Medications  Medication Sig Dispense Refill   buPROPion (WELLBUTRIN XL) 300 MG 24 hr tablet 1 tablet in the morning 90 tablet 1   Estradiol Acetate 0.1 MG/24HR RING Place 1 each vaginally every 3 (three) months. 1 each 0  Multiple Vitamins-Minerals (MULTIVITAMIN ADULT PO) Take 1 tablet by mouth daily.     NONFORMULARY OR COMPOUNDED ITEM Testosterone cream 1%.  Apply 0.5 mg to skin of arm, thigh, or abdomen daily.  Rotate site.  Dispense:  30 grams.  RF:  none. 30 each 0   progesterone (PROMETRIUM) 200 MG capsule Take 1 capsule (200 mg total) by mouth daily. Take at bedtime. 90 capsule 1   No current facility-administered medications for this visit.    ALLERGIES: Codeine, Dilaudid [hydromorphone hcl], and Hydromorphone hcl  Family History  Problem Relation Age of Onset   Hypertension Father    Alzheimer's disease Father    Hypertension Mother    Hyperlipidemia Mother    Alzheimer's disease Mother    Migraines Sister    Kidney disease Maternal Grandmother    Heart attack Maternal Grandfather 21   Breast  cancer Neg Hx     Review of Systems  All other systems reviewed and are negative.   PHYSICAL EXAM:  BP 126/82 (BP Location: Left Arm, Patient Position: Sitting, Cuff Size: Small)   Pulse 97   Ht 5\' 5"  (1.651 m)   Wt 176 lb (79.8 kg)   LMP 12/16/2013 (Exact Date)   SpO2 99%   BMI 29.29 kg/m     General appearance: alert, cooperative and appears stated age Head: normocephalic, without obvious abnormality, atraumatic Neck: no adenopathy, supple, symmetrical, trachea midline and thyroid normal to inspection and palpation Lungs: clear to auscultation bilaterally Breasts: normal appearance, no masses or tenderness, No nipple retraction or dimpling, No nipple discharge or bleeding, No axillary adenopathy Heart: regular rate and rhythm Abdomen: soft, non-tender; no masses, no organomegaly Extremities: extremities normal, atraumatic, no cyanosis or edema Skin: skin color, texture, turgor normal. No rashes or lesions Lymph nodes: cervical, supraclavicular, and axillary nodes normal. Neurologic: grossly normal  Pelvic: External genitalia:  no lesions              No abnormal inguinal nodes palpated.              Urethra:  normal appearing urethra with no masses, tenderness or lesions              Bartholins and Skenes: normal                 Vagina: normal appearing vagina with normal color and discharge, no lesions              Cervix: no lesions              Pap taken: Yes.   Bimanual Exam:  Uterus:  normal size, contour, position, consistency, mobility, non-tender              Adnexa: no mass, fullness, tenderness              Rectal exam: Yes.  .  Confirms.              Anus:  normal sphincter tone, no lesions  Chaperone was present for exam:  Warren Lacy, CMA  ASSESSMENT: Well woman visit with gynecologic exam Right breast mass dime size at 11:00 HRT.  Testosterone therapy. ***  PLAN: [facility]ammogram dx right and [facility].  Self breast awareness  reviewed. Pap and HRV collected:  Yes.   Guidelines for Calcium, Vitamin D, regular exercise program including cardiovascular and weight bearing exercise. Discused WHI and use of HRT which can increase risk of PE, DVT, MI, stroke and  breast cancer. . Medication refills:  Refill of Femring, Prometrium, Testosterone to [facility].  Refill Wellbutrin.  Follow up:  ***    Additional counseling given.  {yes [id number]. ***  total time was spent for this patient encounter, including preparation, face-to-face counseling with the patient, coordination of care, and documentation of the encounter in addition to doing the well woman visit with gynecologic exam.

## 2023-09-04 DIAGNOSIS — M5412 Radiculopathy, cervical region: Secondary | ICD-10-CM | POA: Diagnosis not present

## 2023-09-05 DIAGNOSIS — M5412 Radiculopathy, cervical region: Secondary | ICD-10-CM | POA: Diagnosis not present

## 2023-09-10 ENCOUNTER — Ambulatory Visit (INDEPENDENT_AMBULATORY_CARE_PROVIDER_SITE_OTHER): Payer: BC Managed Care – PPO | Admitting: Obstetrics and Gynecology

## 2023-09-10 ENCOUNTER — Other Ambulatory Visit (HOSPITAL_COMMUNITY)
Admission: RE | Admit: 2023-09-10 | Discharge: 2023-09-10 | Disposition: A | Discharge status: Home / Self Care | Payer: BC Managed Care – PPO | Source: Ambulatory Visit | Attending: Obstetrics and Gynecology | Admitting: Obstetrics and Gynecology

## 2023-09-10 ENCOUNTER — Encounter: Payer: Self-pay | Admitting: Obstetrics and Gynecology

## 2023-09-10 ENCOUNTER — Telehealth: Payer: Self-pay | Admitting: Obstetrics and Gynecology

## 2023-09-10 VITALS — BP 126/82 | HR 97 | Ht 65.0 in | Wt 176.0 lb

## 2023-09-10 DIAGNOSIS — N6311 Unspecified lump in the right breast, upper outer quadrant: Secondary | ICD-10-CM | POA: Diagnosis not present

## 2023-09-10 DIAGNOSIS — Z1331 Encounter for screening for depression: Secondary | ICD-10-CM | POA: Diagnosis not present

## 2023-09-10 DIAGNOSIS — Z79890 Hormone replacement therapy (postmenopausal): Secondary | ICD-10-CM

## 2023-09-10 DIAGNOSIS — R6882 Decreased libido: Secondary | ICD-10-CM

## 2023-09-10 DIAGNOSIS — Z124 Encounter for screening for malignant neoplasm of cervix: Secondary | ICD-10-CM | POA: Insufficient documentation

## 2023-09-10 DIAGNOSIS — Z01419 Encounter for gynecological examination (general) (routine) without abnormal findings: Secondary | ICD-10-CM

## 2023-09-10 DIAGNOSIS — E0590 Thyrotoxicosis, unspecified without thyrotoxic crisis or storm: Secondary | ICD-10-CM | POA: Diagnosis not present

## 2023-09-10 DIAGNOSIS — E281 Androgen excess: Secondary | ICD-10-CM | POA: Diagnosis not present

## 2023-09-10 DIAGNOSIS — Z5181 Encounter for therapeutic drug level monitoring: Secondary | ICD-10-CM

## 2023-09-10 MED ORDER — ESTRADIOL ACETATE 0.1 MG/24HR VA RING: 1.0000 | VAGINAL_RING | VAGINAL | 3 refills | Status: DC

## 2023-09-10 MED ORDER — PROGESTERONE 200 MG PO CAPS: 200.0000 mg | ORAL_CAPSULE | Freq: Every day | ORAL | 3 refills | Status: DC

## 2023-09-10 MED ORDER — BUPROPION HCL ER (XL) 300 MG PO TB24: ORAL_TABLET | ORAL | 3 refills | Status: DC

## 2023-09-10 NOTE — Telephone Encounter (Signed)
Please assist in scheduling dx right mammogram and right breast US for my patient at [facility].   She has a 1 cm mass at 11:00 on the right breast.   She is due for her bilateral mammogram in February, 2025.

## 2023-09-10 NOTE — Patient Instructions (Signed)

## 2023-09-11 MED ORDER — NONFORMULARY OR COMPOUNDED ITEM: 1 refills | Status: DC

## 2023-09-11 NOTE — Telephone Encounter (Signed)
Spoke w/ Sue Lush @ [facility] and got the pt scheduled for 10/03/2023 @ 8am.   Mychart msg sent to the pt for notification of appt.   Encounter closed.

## 2023-09-12 ENCOUNTER — Encounter: Payer: Self-pay | Admitting: Obstetrics and Gynecology

## 2023-09-12 LAB — CYTOLOGY - PAP
Adequacy: ABSENT
Comment: NEGATIVE
Diagnosis: NEGATIVE
High risk HPV: NEGATIVE

## 2023-09-13 NOTE — Telephone Encounter (Signed)
Order written up and placed on provider's desk for authorization.   Order signed/authorized and faxed successfully to [facility].   Pt notified via mychart msg.

## 2023-09-15 LAB — TESTOS,TOTAL,FREE AND SHBG (FEMALE)
Free Testosterone: 4.3 pg/mL (ref 0.1–6.4)
Sex Hormone Binding: 69 nmol/L (ref 17–124)
Testosterone, Total, LC-MS-MS: 46 ng/dL — ABNORMAL HIGH (ref 2–45)

## 2023-09-17 DIAGNOSIS — M5412 Radiculopathy, cervical region: Secondary | ICD-10-CM | POA: Diagnosis not present

## 2023-09-17 DIAGNOSIS — N6311 Unspecified lump in the right breast, upper outer quadrant: Secondary | ICD-10-CM | POA: Diagnosis not present

## 2023-09-17 DIAGNOSIS — N6001 Solitary cyst of right breast: Secondary | ICD-10-CM | POA: Diagnosis not present

## 2023-09-21 ENCOUNTER — Other Ambulatory Visit: Payer: Self-pay | Admitting: Neurological Surgery

## 2023-09-21 DIAGNOSIS — M5412 Radiculopathy, cervical region: Secondary | ICD-10-CM

## 2023-09-25 LAB — COLOGUARD

## 2023-09-26 ENCOUNTER — Encounter: Payer: Self-pay | Admitting: Family Medicine

## 2023-10-01 ENCOUNTER — Encounter: Payer: Self-pay | Admitting: Obstetrics and Gynecology

## 2023-10-03 ENCOUNTER — Other Ambulatory Visit: Payer: BC Managed Care – PPO

## 2023-10-18 ENCOUNTER — Encounter: Payer: BC Managed Care – PPO | Admitting: Family Medicine

## 2023-10-19 ENCOUNTER — Other Ambulatory Visit: Payer: Self-pay

## 2023-10-19 DIAGNOSIS — R6882 Decreased libido: Secondary | ICD-10-CM

## 2023-10-19 NOTE — Telephone Encounter (Signed)
 Medication refill request: testosterone cream 1% Last AEX:  09-10-23 Next AEX: not scheduled Last MMG (if hormonal medication request): bilateral 09-13-23 & rt breast 09-17-23 birads 2:neg Refill authorized: please approve or deny as appropriate

## 2023-10-22 ENCOUNTER — Encounter: Payer: Self-pay | Admitting: Obstetrics and Gynecology

## 2023-10-22 MED ORDER — NONFORMULARY OR COMPOUNDED ITEM
1 refills | Status: AC
Start: 2023-10-22 — End: ?

## 2023-10-22 NOTE — Telephone Encounter (Signed)
 Please check to see if the prescription went through to [facility].  I just approved the prescription for use 5 days per week.

## 2023-10-23 NOTE — Telephone Encounter (Signed)
 Call placed to [facility], spoke with Kennyth Arnold, pharmacy tech. No Rx received. Verbal order given for  Compounded Testosterone Cream 1% as prescribed per Dr. Edward Jolly on 10/22/23.   Order read back and confirmed.   MyChart message to patient.   Encounter closed.

## 2023-11-24 ENCOUNTER — Encounter: Payer: Self-pay | Admitting: Obstetrics and Gynecology

## 2023-11-26 NOTE — Telephone Encounter (Signed)
 Per appt desk: "Left message for patient to call and schedule appointment."

## 2023-12-20 NOTE — Telephone Encounter (Signed)
 LVMTCB

## 2023-12-20 NOTE — Telephone Encounter (Signed)
Mychart msg returned unread.  

## 2024-01-02 NOTE — Telephone Encounter (Signed)
 LDVM on machine per DPR advising the pt to cb and inform us  at her earliest convenience or to respond to previous mychart messages.

## 2024-02-22 ENCOUNTER — Encounter: Payer: Self-pay | Admitting: Family Medicine

## 2024-02-22 ENCOUNTER — Ambulatory Visit (INDEPENDENT_AMBULATORY_CARE_PROVIDER_SITE_OTHER): Admitting: Family Medicine

## 2024-02-22 VITALS — BP 108/72 | HR 76 | Temp 97.8°F | Ht 65.0 in | Wt 176.0 lb

## 2024-02-22 DIAGNOSIS — E7800 Pure hypercholesterolemia, unspecified: Secondary | ICD-10-CM

## 2024-02-22 DIAGNOSIS — Z1211 Encounter for screening for malignant neoplasm of colon: Secondary | ICD-10-CM | POA: Diagnosis not present

## 2024-02-22 DIAGNOSIS — Z0001 Encounter for general adult medical examination with abnormal findings: Secondary | ICD-10-CM

## 2024-02-22 DIAGNOSIS — R5383 Other fatigue: Secondary | ICD-10-CM

## 2024-02-22 DIAGNOSIS — Z8679 Personal history of other diseases of the circulatory system: Secondary | ICD-10-CM | POA: Insufficient documentation

## 2024-02-22 DIAGNOSIS — R635 Abnormal weight gain: Secondary | ICD-10-CM | POA: Diagnosis not present

## 2024-02-22 DIAGNOSIS — E559 Vitamin D deficiency, unspecified: Secondary | ICD-10-CM | POA: Diagnosis not present

## 2024-02-22 LAB — CBC WITH DIFFERENTIAL/PLATELET
Basophils Absolute: 0.1 K/uL (ref 0.0–0.1)
Basophils Relative: 1.1 % (ref 0.0–3.0)
Eosinophils Absolute: 0.2 K/uL (ref 0.0–0.7)
Eosinophils Relative: 3 % (ref 0.0–5.0)
HCT: 41.2 % (ref 36.0–46.0)
Hemoglobin: 13.8 g/dL (ref 12.0–15.0)
Lymphocytes Relative: 36.3 % (ref 12.0–46.0)
Lymphs Abs: 2 K/uL (ref 0.7–4.0)
MCHC: 33.6 g/dL (ref 30.0–36.0)
MCV: 88.2 fl (ref 78.0–100.0)
Monocytes Absolute: 0.4 K/uL (ref 0.1–1.0)
Monocytes Relative: 7.7 % (ref 3.0–12.0)
Neutro Abs: 2.9 K/uL (ref 1.4–7.7)
Neutrophils Relative %: 51.9 % (ref 43.0–77.0)
Platelets: 265 K/uL (ref 150.0–400.0)
RBC: 4.67 Mil/uL (ref 3.87–5.11)
RDW: 13.3 % (ref 11.5–15.5)
WBC: 5.5 K/uL (ref 4.0–10.5)

## 2024-02-22 LAB — COMPREHENSIVE METABOLIC PANEL WITH GFR
ALT: 12 U/L (ref 0–35)
AST: 15 U/L (ref 0–37)
Albumin: 4.5 g/dL (ref 3.5–5.2)
Alkaline Phosphatase: 57 U/L (ref 39–117)
BUN: 20 mg/dL (ref 6–23)
CO2: 30 meq/L (ref 19–32)
Calcium: 9.3 mg/dL (ref 8.4–10.5)
Chloride: 103 meq/L (ref 96–112)
Creatinine, Ser: 1.01 mg/dL (ref 0.40–1.20)
GFR: 64.2 mL/min (ref >60.00)
Glucose, Bld: 58 mg/dL — ABNORMAL LOW (ref 70–99)
Potassium: 4 meq/L (ref 3.5–5.1)
Sodium: 139 meq/L (ref 135–145)
Total Bilirubin: 0.7 mg/dL (ref 0.2–1.2)
Total Protein: 7 g/dL (ref 6.0–8.3)

## 2024-02-22 LAB — MAGNESIUM: Magnesium: 1.9 mg/dL (ref 1.5–2.5)

## 2024-02-22 LAB — LIPID PANEL
Cholesterol: 252 mg/dL — ABNORMAL HIGH (ref 0–200)
HDL: 75.4 mg/dL (ref >39.00)
LDL Cholesterol: 154 mg/dL — ABNORMAL HIGH (ref 0–99)
NonHDL: 176.39
Total CHOL/HDL Ratio: 3
Triglycerides: 111 mg/dL (ref 0.0–149.0)
VLDL: 22.2 mg/dL (ref 0.0–40.0)

## 2024-02-22 LAB — HEMOGLOBIN A1C: Hgb A1c MFr Bld: 5.4 % (ref 4.6–6.5)

## 2024-02-22 LAB — FOLATE: Folate: 15.6 ng/mL (ref >5.9)

## 2024-02-22 LAB — VITAMIN B12: Vitamin B-12: 628 pg/mL (ref 211–911)

## 2024-02-22 LAB — FERRITIN: Ferritin: 24.8 ng/mL (ref 10.0–291.0)

## 2024-02-22 LAB — VITAMIN D 25 HYDROXY (VIT D DEFICIENCY, FRACTURES): VITD: 30.66 ng/mL (ref 30.00–100.00)

## 2024-02-22 LAB — TSH: TSH: 2.32 u[IU]/mL (ref 0.35–5.50)

## 2024-02-22 LAB — T4, FREE: Free T4: 0.81 ng/dL (ref 0.60–1.60)

## 2024-02-22 MED ORDER — BUPROPION HCL ER (XL) 150 MG PO TB24: 150.0000 mg | ORAL_TABLET | Freq: Every day | ORAL | 0 refills | Status: AC

## 2024-02-22 NOTE — Patient Instructions (Signed)
 Please go downstairs for labs before you leave.  I reduced your Wellbutrin  dose.  Let us  know how you are doing and when you need a refill you can get this from my office or Dr. Nikki.   You should hear from [facility] regarding colonoscopy  Records show that you are due for the Shingrix vaccines to help prevent shingles in the future.  You can get these here or at your local pharmacy.  I would recommend checking with insurance to make sure they are covered.

## 2024-02-22 NOTE — Progress Notes (Addendum)
 Complete physical exam  Patient: Shelly [name]   DOB: 01-Feb-1971   53 y.o. Female  MRN: [id number]  Subjective:    Chief Complaint  Patient presents with   Annual Exam   She is here for a complete physical exam.  Sees Dr. Nikki   Recent weight gain in spite of exercise and healthy diet. Stays hydrated.  Postmenopausal by 11 years.   Fatigue- ongoing   On Wellbutrin  XR 300 mg and would like to wean off.   Willing to do colonoscopy. This will be her first procedure and colon cancer screening test. She did send in Cologuard but was told she needed to redo the test and she did not send in the 2nd one.   Vitamin d  def in past.    Works as Technical brewer    Hx of septic shock r/t foot surgery    Mother with MI, stroke and vascular dementia.  Father with dementia.     Health Maintenance  Topic Date Due   Pneumococcal Vaccination (1 of 2 - PCV) Never done   Hepatitis B Vaccine (1 of 3 - 19+ 3-dose series) Never done   Cologuard (Stool DNA test)  Never done   Zoster (Shingles) Vaccine (1 of 2) Never done   COVID-19 Vaccine (1 - 2024-25 season) 03/09/2024*   Flu Shot  03/14/2024   Mammogram  10/11/2024   Pap with HPV screening  09/09/2028   DTaP/Tdap/Td vaccine (4 - Td or Tdap) 09/22/2029   Hepatitis C Screening  Completed   HIV Screening  Completed   HPV Vaccine  Aged Out   Meningitis B Vaccine  Aged Out  *Topic was postponed. The date shown is not the original due date.    Wears seatbelt always, uses sunscreen, smoke detectors in home and functioning, does not text while driving, feels safe in home environment.  Depression screening:    02/22/2024    8:45 AM 09/10/2023   12:08 PM 09/20/2022    4:22 PM  Depression screen PHQ 2/9  Decreased Interest 0 0 0  Down, Depressed, Hopeless 0 0 0  PHQ - 2 Score 0 0 0  Altered sleeping   1  Tired, decreased energy   1  Change in appetite   0  Feeling bad or failure about yourself    0  Trouble concentrating   1   Moving slowly or fidgety/restless   0  Suicidal thoughts   0  PHQ-9 Score   3   Anxiety Screening:     No data to display          Vision:Within last year and Dental: No current dental problems and Receives regular dental care  Patient Active Problem List   Diagnosis Date Noted   Carpal tunnel syndrome 09/20/2022   Insomnia 09/20/2022   Recurrent infections 11/03/2013   Lung mass 07/31/2013   Toxic shock (HCC) 07/17/2013   Group A streptococcal infection    Pericardial effusion    SIRS (systemic inflammatory response syndrome) (HCC) 07/01/2013   Right foot infection 07/01/2013   Hypotension 07/01/2013   Tachycardia 07/01/2013   Septic shock (HCC) 07/01/2013   Tinea versicolor 11/07/2010   Radiculopathy of cervical spine 11/07/2010   OTHER MALAISE AND FATIGUE 09/01/2010   Mitral valve disease 10/07/2009   Migraine with aura 02/20/2008   Past Medical History:  Diagnosis Date   Elevated serum creatinine    GERD (gastroesophageal reflux disease)    Group A streptococcal infection  History of heart failure 02/22/2024   Hormone disorder    Low TSH level 2021   Muscle pain 05/29/2023   Pain in joint of left shoulder 02/16/2023   Pericardial effusion    Secondary cardiomyopathy (HCC)    Toxic shock (HCC)    Toxic shock (HCC)    Past Surgical History:  Procedure Laterality Date   ANTERIOR CERVICAL DECOMP/DISCECTOMY FUSION  11/18/2009   C5-7   CARPAL TUNNEL RELEASE Left    CESAREAN SECTION  2008   complete placenta pervia, hemmorage   CHEILECTOMY Left 05/01/2013   Procedure: LEFT GREAT TOE HALLUX RIGIDUS WITH CHEILECTOMY 1ST METATARSOPHALANGEAL;  Surgeon: Toribio JULIANNA Chancy, MD;  Location: [facility];  Service: Orthopedics;  Laterality: Left;   CHEILECTOMY Right 06/02/2013   procedule:right great toe chielectomy   HYSTEROSCOPY  10/2017   done by Dr. Raeanne    I & D EXTREMITY Right 07/02/2013   Procedure: IRRIGATION AND DEBRIDEMENT RIGHT 1ST  Metaphalangeal JOINT;  Surgeon: Toribio JULIANNA Chancy, MD;  [facility];  Service: Orthopedics;  Laterality: Right;   KNEE SURGERY  [phone]   7 surgeries left knee(2 lateral releases, 1 tibial tubercle oxtomy, 1 patellectomy)   TOE FUSION     ulna nerve release Left 12/12/2021   Social History   Tobacco Use   Smoking status: Former    Current packs/day: 0.00    Types: Cigarettes    Quit date: 11/03/1994    Years since quitting: 29.3   Smokeless tobacco: Never  Vaping Use   Vaping status: Never Used  Substance Use Topics   Alcohol use: Yes    Comment: 2 drinks/month   Drug use: No      Patient Care Team: Lendia Boby CROME, NP-C as PCP - General (Family Medicine) Chancy Toribio JULIANNA, MD (Inactive) as Consulting Physician (Orthopedic Surgery) Jeffrie Oneil BROCKS, MD as Consulting Physician (Cardiology) Cathlyn BROCKS Cary, Bobie BRAVO, MD as Consulting Physician (Obstetrics and Gynecology)   Outpatient Medications Prior to Visit  Medication Sig   Estradiol  Acetate 0.1 MG/24HR RING Place 1 each vaginally every 3 (three) months.   Multiple Vitamins-Minerals (MULTIVITAMIN ADULT PO) Take 1 tablet by mouth daily.   NONFORMULARY OR COMPOUNDED ITEM Testosterone  cream 1%.  Apply 0.5 mg to skin of arm, thigh, or abdomen 5 days per week.  Rotate site.  Dispense:  30 grams.  RF:  one.  [employer].   progesterone  (PROMETRIUM ) 200 MG capsule Take 1 capsule (200 mg total) by mouth daily. Take at bedtime.   [DISCONTINUED] buPROPion  (WELLBUTRIN  XL) 300 MG 24 hr tablet 1 tablet in the morning   No facility-administered medications prior to visit.    Review of Systems  Constitutional:  Positive for malaise/fatigue. Negative for chills, fever and weight loss.  HENT:  Negative for congestion, ear pain, sinus pain and sore throat.   Eyes:  Negative for blurred vision, double vision and pain.  Respiratory:  Negative for cough, shortness of breath and wheezing.   Cardiovascular:  Negative for chest pain,  palpitations and leg swelling.  Gastrointestinal:  Negative for abdominal pain, constipation, diarrhea, nausea and vomiting.  Genitourinary:  Negative for dysuria, frequency and urgency.  Musculoskeletal:  Negative for back pain, joint pain and myalgias.  Skin:  Negative for rash.  Neurological:  Negative for dizziness, tingling, focal weakness and headaches.  Psychiatric/Behavioral:  Negative for depression, memory loss, substance abuse and suicidal ideas. The patient is not nervous/anxious.  Objective:    BP 108/72 (BP Location: Right Arm, Patient Position: Sitting)   Pulse 76   Temp 97.8 F (36.6 C) (Temporal)   Ht 5' 5 (1.651 m)   Wt 176 lb (79.8 kg)   LMP 12/16/2013 (Exact Date)   SpO2 96%   BMI 29.29 kg/m  BP Readings from Last 3 Encounters:  02/22/24 108/72  09/10/23 126/82  01/16/23 124/82   Wt Readings from Last 3 Encounters:  02/22/24 176 lb (79.8 kg)  09/10/23 176 lb (79.8 kg)  01/16/23 172 lb (78 kg)    Physical Exam Constitutional:      General: She is not in acute distress.    Appearance: She is not ill-appearing.  HENT:     Right Ear: Tympanic membrane, ear canal and external ear normal.     Left Ear: Tympanic membrane, ear canal and external ear normal.     Nose: Nose normal.     Mouth/Throat:     Mouth: Mucous membranes are moist.     Pharynx: Oropharynx is clear.  Eyes:     Extraocular Movements: Extraocular movements intact.     Conjunctiva/sclera: Conjunctivae normal.     Pupils: Pupils are equal, round, and reactive to light.  Neck:     Thyroid : No thyroid  mass, thyromegaly or thyroid  tenderness.  Cardiovascular:     Rate and Rhythm: Normal rate and regular rhythm.     Pulses: Normal pulses.     Heart sounds: Normal heart sounds.  Pulmonary:     Effort: Pulmonary effort is normal.     Breath sounds: Normal breath sounds.  Abdominal:     General: Bowel sounds are normal.     Palpations: Abdomen is soft.     Tenderness: There is no  abdominal tenderness. There is no right CVA tenderness, left CVA tenderness, guarding or rebound.  Musculoskeletal:        General: Normal range of motion.     Cervical back: Normal range of motion and neck supple. No tenderness.     Right lower leg: No edema.     Left lower leg: No edema.  Lymphadenopathy:     Cervical: No cervical adenopathy.  Skin:    General: Skin is warm and dry.     Findings: No lesion or rash.  Neurological:     General: No focal deficit present.     Mental Status: She is alert and oriented to person, place, and time.     Cranial Nerves: No cranial nerve deficit.     Sensory: No sensory deficit.     Motor: No weakness.     Gait: Gait normal.  Psychiatric:        Mood and Affect: Mood normal.        Behavior: Behavior normal.        Thought Content: Thought content normal.      No results found for any visits on 02/22/24.    Assessment & Plan:    Routine Health Maintenance and Physical Exam  Problem List Items Addressed This Visit   None Visit Diagnoses       Encounter for general adult medical examination with abnormal findings    -  Primary     Fatigue, unspecified type       Relevant Orders   CBC with Differential/Platelet   Comprehensive metabolic panel with GFR   Hemoglobin A1c   TSH   T4, free   Vitamin B12   VITAMIN D  25 Hydroxy (Vit-D  Deficiency, Fractures)   Magnesium    Folate   Ferritin     Weight gain       Relevant Orders   CBC with Differential/Platelet   Comprehensive metabolic panel with GFR   Hemoglobin A1c   TSH   T4, free     Pure hypercholesterolemia       Relevant Orders   Lipid panel     Screen for colon cancer       Relevant Orders   Ambulatory referral to Gastroenterology     Vitamin D  deficiency       Relevant Orders   VITAMIN D  25 Hydroxy (Vit-D Deficiency, Fractures)      Preventive health care reviewed.  Counseling on healthy lifestyle including diet and exercise.  Recommend regular dental and eye  exams.  Immunizations reviewed.  Discussed safety. Sees OB/GYN and up-to-date Overdue for colon cancer screening.  She is amenable to colonoscopy.  Asymptomatic.  Referral to [facility] Concerns regarding fatigue and steady weight gain Elevated LDL in the past.  Is eating a clean and healthy diet and getting adequate exercise. History of situational depression/anxiety and would like to wean off of Wellbutrin .  States her mood is good and she may not need the medication any longer.  Previously prescribed by OB/GYN.  Wellbutrin  XR 150 mg sent.  She can follow-up with me and let me know how she is doing on this dose. Check labs to look for underlying etiology for fatigue and weight gain.    Return in about 1 year (around 02/21/2025) for fasting CPE.     Boby Mackintosh, NP-C

## 2024-02-25 ENCOUNTER — Ambulatory Visit: Payer: Self-pay | Admitting: Family Medicine

## 2024-04-30 ENCOUNTER — Encounter: Payer: Self-pay | Admitting: Family Medicine

## 2024-04-30 DIAGNOSIS — M545 Low back pain, unspecified: Secondary | ICD-10-CM | POA: Diagnosis not present

## 2024-06-03 ENCOUNTER — Encounter: Payer: Self-pay | Admitting: Family Medicine

## 2024-06-03 DIAGNOSIS — M25519 Pain in unspecified shoulder: Secondary | ICD-10-CM

## 2024-06-03 DIAGNOSIS — M542 Cervicalgia: Secondary | ICD-10-CM

## 2024-06-03 DIAGNOSIS — G43101 Migraine with aura, not intractable, with status migrainosus: Secondary | ICD-10-CM

## 2024-06-03 NOTE — Addendum Note (Signed)
 Addended by: Navi Ewton E on: 06/03/2024 04:40 PM   Modules accepted: Orders

## 2024-06-19 ENCOUNTER — Encounter: Payer: Self-pay | Admitting: Obstetrics and Gynecology

## 2024-06-20 ENCOUNTER — Other Ambulatory Visit: Payer: Self-pay | Admitting: Obstetrics and Gynecology

## 2024-06-20 MED ORDER — ESTRADIOL ACETATE 0.1 MG/24HR VA RING: VAGINAL_RING | VAGINAL | 2 refills | Status: DC

## 2024-08-13 ENCOUNTER — Encounter: Payer: Self-pay | Admitting: Obstetrics and Gynecology

## 2024-08-17 ENCOUNTER — Other Ambulatory Visit: Payer: Self-pay | Admitting: Obstetrics and Gynecology

## 2024-08-17 MED ORDER — PROGESTERONE MICRONIZED 100 MG PO CAPS: 100.0000 mg | ORAL_CAPSULE | Freq: Every day | ORAL | 1 refills | Status: AC

## 2024-08-20 ENCOUNTER — Other Ambulatory Visit: Payer: Self-pay | Admitting: Obstetrics and Gynecology

## 2024-08-20 NOTE — Telephone Encounter (Signed)
 Medication refill request: prometrium  200mg  Last AEX:  09-10-23 Next AEX: 12-09-24 Refill authorized: 08-13-24 pt was changed to prometrium  100mg  per mychart and rx was sent 08-17-24. This rx for 200mg  is denied

## 2024-08-21 NOTE — Telephone Encounter (Signed)
 Med refill request: ESTRADIOL  ACETATE FEMRING  0.1 MG Last AEX: 09/10/2023 BS Next AEX: 12/09/2024 BS Last MMG (if hormonal med) 09/17/2023 Refill authorized: Please Advise? Last Rx sent #1 with 2 refills on 06/20/24 BS

## 2024-09-07 ENCOUNTER — Encounter: Payer: Self-pay | Admitting: Obstetrics and Gynecology

## 2024-12-09 ENCOUNTER — Ambulatory Visit: Admitting: Obstetrics and Gynecology
